# Patient Record
Sex: Female | Born: 1970
Health system: Southern US, Community
[De-identification: ages and names within clinical notes are randomized; demographics above are authoritative.]

## PROBLEM LIST (undated history)

## (undated) DIAGNOSIS — I10 Essential (primary) hypertension: Secondary | ICD-10-CM

## (undated) DIAGNOSIS — Z923 Personal history of irradiation: Secondary | ICD-10-CM

## (undated) DIAGNOSIS — K219 Gastro-esophageal reflux disease without esophagitis: Secondary | ICD-10-CM

## (undated) DIAGNOSIS — Z87442 Personal history of urinary calculi: Secondary | ICD-10-CM

## (undated) DIAGNOSIS — Z803 Family history of malignant neoplasm of breast: Secondary | ICD-10-CM

## (undated) DIAGNOSIS — Z9889 Other specified postprocedural states: Secondary | ICD-10-CM

## (undated) DIAGNOSIS — R112 Nausea with vomiting, unspecified: Secondary | ICD-10-CM

## (undated) DIAGNOSIS — N649 Disorder of breast, unspecified: Secondary | ICD-10-CM

## (undated) DIAGNOSIS — A048 Other specified bacterial intestinal infections: Secondary | ICD-10-CM

## (undated) DIAGNOSIS — IMO0001 Reserved for inherently not codable concepts without codable children: Secondary | ICD-10-CM

## (undated) DIAGNOSIS — C50919 Malignant neoplasm of unspecified site of unspecified female breast: Secondary | ICD-10-CM

## (undated) DIAGNOSIS — Z800 Family history of malignant neoplasm of digestive organs: Secondary | ICD-10-CM

## (undated) DIAGNOSIS — E7800 Pure hypercholesterolemia, unspecified: Secondary | ICD-10-CM

## (undated) DIAGNOSIS — N83209 Unspecified ovarian cyst, unspecified side: Secondary | ICD-10-CM

## (undated) DIAGNOSIS — G43909 Migraine, unspecified, not intractable, without status migrainosus: Secondary | ICD-10-CM

## (undated) HISTORY — DX: Malignant neoplasm of unspecified site of unspecified female breast: C50.919

## (undated) HISTORY — DX: Essential (primary) hypertension: I10

## (undated) HISTORY — DX: Family history of malignant neoplasm of breast: Z80.3

## (undated) HISTORY — DX: Migraine, unspecified, not intractable, without status migrainosus: G43.909

## (undated) HISTORY — DX: Family history of malignant neoplasm of digestive organs: Z80.0

## (undated) HISTORY — DX: Disorder of breast, unspecified: N64.9

## (undated) HISTORY — PX: TUBAL LIGATION: SHX77

## (undated) HISTORY — DX: Other specified bacterial intestinal infections: A04.8

---

## 2000-04-21 ENCOUNTER — Encounter: Payer: Self-pay | Admitting: Endocrinology

## 2000-04-21 ENCOUNTER — Ambulatory Visit (HOSPITAL_COMMUNITY): Admission: RE | Admit: 2000-04-21 | Discharge: 2000-04-21 | Payer: Self-pay | Admitting: Endocrinology

## 2000-07-16 ENCOUNTER — Ambulatory Visit (HOSPITAL_COMMUNITY): Admission: RE | Admit: 2000-07-16 | Discharge: 2000-07-16 | Payer: Self-pay | Admitting: Interventional Cardiology

## 2000-07-17 ENCOUNTER — Encounter: Payer: Self-pay | Admitting: General Surgery

## 2000-07-17 ENCOUNTER — Ambulatory Visit (HOSPITAL_COMMUNITY): Admission: RE | Admit: 2000-07-17 | Discharge: 2000-07-17 | Payer: Self-pay | Admitting: General Surgery

## 2000-08-22 ENCOUNTER — Other Ambulatory Visit: Admission: RE | Admit: 2000-08-22 | Discharge: 2000-08-22 | Payer: Self-pay | Admitting: Obstetrics and Gynecology

## 2000-09-22 ENCOUNTER — Ambulatory Visit (HOSPITAL_COMMUNITY): Admission: RE | Admit: 2000-09-22 | Discharge: 2000-09-22 | Payer: Self-pay | Admitting: Internal Medicine

## 2001-02-03 ENCOUNTER — Encounter: Payer: Self-pay | Admitting: Obstetrics and Gynecology

## 2001-02-03 ENCOUNTER — Ambulatory Visit (HOSPITAL_COMMUNITY): Admission: RE | Admit: 2001-02-03 | Discharge: 2001-02-03 | Payer: Self-pay | Admitting: Obstetrics and Gynecology

## 2001-08-10 ENCOUNTER — Emergency Department (HOSPITAL_COMMUNITY): Admission: EM | Admit: 2001-08-10 | Discharge: 2001-08-10 | Payer: Self-pay | Admitting: Emergency Medicine

## 2002-12-20 ENCOUNTER — Emergency Department (HOSPITAL_COMMUNITY): Admission: EM | Admit: 2002-12-20 | Discharge: 2002-12-20 | Payer: Self-pay | Admitting: Emergency Medicine

## 2002-12-20 ENCOUNTER — Encounter: Payer: Self-pay | Admitting: Emergency Medicine

## 2002-12-31 ENCOUNTER — Encounter: Payer: Self-pay | Admitting: Emergency Medicine

## 2002-12-31 ENCOUNTER — Emergency Department (HOSPITAL_COMMUNITY): Admission: EM | Admit: 2002-12-31 | Discharge: 2002-12-31 | Payer: Self-pay | Admitting: Emergency Medicine

## 2003-01-05 ENCOUNTER — Ambulatory Visit (HOSPITAL_COMMUNITY): Admission: RE | Admit: 2003-01-05 | Discharge: 2003-01-05 | Payer: Self-pay | Admitting: Urology

## 2003-04-11 ENCOUNTER — Emergency Department (HOSPITAL_COMMUNITY): Admission: EM | Admit: 2003-04-11 | Discharge: 2003-04-11 | Payer: Self-pay | Admitting: Emergency Medicine

## 2004-03-23 ENCOUNTER — Emergency Department (HOSPITAL_COMMUNITY): Admission: EM | Admit: 2004-03-23 | Discharge: 2004-03-23 | Payer: Self-pay | Admitting: Emergency Medicine

## 2004-06-27 ENCOUNTER — Emergency Department (HOSPITAL_COMMUNITY): Admission: EM | Admit: 2004-06-27 | Discharge: 2004-06-27 | Payer: Self-pay | Admitting: *Deleted

## 2004-11-29 ENCOUNTER — Emergency Department (HOSPITAL_COMMUNITY): Admission: EM | Admit: 2004-11-29 | Discharge: 2004-11-29 | Payer: Self-pay | Admitting: Emergency Medicine

## 2004-12-04 ENCOUNTER — Ambulatory Visit (HOSPITAL_COMMUNITY): Admission: RE | Admit: 2004-12-04 | Discharge: 2004-12-04 | Payer: Self-pay | Admitting: Urology

## 2004-12-05 ENCOUNTER — Ambulatory Visit (HOSPITAL_COMMUNITY): Admission: RE | Admit: 2004-12-05 | Discharge: 2004-12-05 | Payer: Self-pay | Admitting: Urology

## 2004-12-07 ENCOUNTER — Ambulatory Visit (HOSPITAL_COMMUNITY): Admission: RE | Admit: 2004-12-07 | Discharge: 2004-12-07 | Payer: Self-pay | Admitting: Urology

## 2007-11-23 ENCOUNTER — Other Ambulatory Visit: Admission: RE | Admit: 2007-11-23 | Discharge: 2007-11-23 | Payer: Self-pay | Admitting: Internal Medicine

## 2007-12-09 ENCOUNTER — Ambulatory Visit (HOSPITAL_COMMUNITY): Admission: RE | Admit: 2007-12-09 | Discharge: 2007-12-09 | Payer: Self-pay | Admitting: Internal Medicine

## 2007-12-20 ENCOUNTER — Emergency Department (HOSPITAL_COMMUNITY): Admission: EM | Admit: 2007-12-20 | Discharge: 2007-12-20 | Payer: Self-pay | Admitting: Emergency Medicine

## 2008-08-28 ENCOUNTER — Emergency Department (HOSPITAL_COMMUNITY): Admission: EM | Admit: 2008-08-28 | Discharge: 2008-08-28 | Payer: Self-pay | Admitting: Emergency Medicine

## 2009-02-03 ENCOUNTER — Emergency Department (HOSPITAL_COMMUNITY): Admission: EM | Admit: 2009-02-03 | Discharge: 2009-02-03 | Payer: Self-pay | Admitting: Emergency Medicine

## 2009-03-16 ENCOUNTER — Ambulatory Visit (HOSPITAL_COMMUNITY): Admission: RE | Admit: 2009-03-16 | Discharge: 2009-03-16 | Payer: Self-pay | Admitting: Internal Medicine

## 2009-03-28 ENCOUNTER — Ambulatory Visit (HOSPITAL_COMMUNITY): Admission: RE | Admit: 2009-03-28 | Discharge: 2009-03-28 | Payer: Self-pay | Admitting: Internal Medicine

## 2009-12-23 ENCOUNTER — Emergency Department (HOSPITAL_COMMUNITY): Admission: EM | Admit: 2009-12-23 | Discharge: 2009-12-23 | Payer: Self-pay | Admitting: Emergency Medicine

## 2010-05-23 LAB — RAPID STREP SCREEN (MED CTR MEBANE ONLY): Streptococcus, Group A Screen (Direct): NEGATIVE

## 2010-06-18 LAB — RAPID STREP SCREEN (MED CTR MEBANE ONLY): Streptococcus, Group A Screen (Direct): POSITIVE — AB

## 2010-07-27 NOTE — H&P (Signed)
Lorraine Peters, Lorraine Peters                  ACCOUNT NO.:  [id number]   MEDICAL RECORD NO.:  [id number]          PATIENT TYPE:  AMB   LOCATION:  DAY                           FACILITY:  [facility]   PHYSICIAN:  Dennie Maizes, M.D.   DATE OF BIRTH:  Nov 28, 1970   DATE OF ADMISSION:  12/04/2004  DATE OF DISCHARGE:  LH                                HISTORY & PHYSICAL   CHIEF COMPLAINT:  Intermittent left flank pain, mild hematuria.   HISTORY OF PRESENT ILLNESS:  This 40 year old female has a past history of  recurrent urolithiasis.  She has undergone urethroscopy stone extraction in  October 2004.  She has been having intermittent, severe left flank pain  radiating to the front for the past five days.  This is associated with  mildly intermittent hematuria.  She denied having any fever, chills, voiding  difficulty or dysuria. A CT scan of the abdomen and pelvis revealed a 3 mm  size lower pole renal calculus on the left side.  Also a 6 mm size stone in  the left ureteropelvic junction  area with obstruction and hydronephrosis.  The patient is unable to pass the stone.  She is brought to the short-stay  center today for extracorporeal shock wave lithotripsy of the left renal  calculus.   PAST MEDICAL HISTORY:  1.  History of intermittent low back pain.  2.  PMS.  3.  Gastroesophageal reflux disease.  4.  Recurrent urolithiasis, status post urethroscopy, stone extraction in      October 2004.   MEDICATIONS:  Tylox p.r.n. for pain.   ALLERGIES:  None.   PAST SURGICAL HISTORY:  Tubal ligation.   FAMILY HISTORY:  Positive for kidney stones and cancer.   PHYSICAL EXAMINATION:  VITAL SIGNS:  Height 5 feet 8 inches, weight 170  pounds.  HEENT:  Normal.  NECK:  No masses.  LUNGS:  Clear to auscultation.  HEART:  Regular rate and rhythm. No murmurs.  ABDOMEN:  No palpable flank masses.  Mild costovertebral angle tenderness  was noted.  Bladder is not palpable.  No suprapubic tenderness.   IMPRESSION:  Left renal colic, left renal calculus.   PLAN:  Extracorporeal shock wave lithotripsy of left renal calculus with IV  sedation in the day hospital.  I have discussed with the patient regarding  the diagnosis, operative details, alternate treatments, outcome, possible  risks and complications.  She has agreed for the procedure to be done.      Dennie Maizes, M.D.  Electronically Signed     SK/MEDQ  D:  12/05/2004  T:  12/05/2004  Job:  [id number]

## 2010-07-27 NOTE — Op Note (Signed)
Lorraine Peters, Lorraine Peters                            ACCOUNT NO.:  [id number]   MEDICAL RECORD NO.:  [id number]                   PATIENT TYPE:  AMB   LOCATION:  DAY                                  FACILITY:  [facility]   PHYSICIAN:  Dennie Maizes, M.D.                DATE OF BIRTH:  February 04, 1971   DATE OF PROCEDURE:  01/05/2003  DATE OF DISCHARGE:                                 OPERATIVE REPORT   PREOPERATIVE DIAGNOSES:  1. Right distal ureteral calculus without obstruction.  2. Right renal colic.   POSTOPERATIVE DIAGNOSES:  1. Right distal ureteral calculus without obstruction.  2. Right renal colic.   PROCEDURES:  1. Cystoscopy.  2. Retrograde pyelogram.  3. Right urethroscopy.  4. Stone manipulation.  5. Right ureteral stent placement.   ANESTHESIA:  General.   SURGEON:  Hollice Espy, M.D.   COMPLICATIONS:  None.   DRAINS:  6-French, 36 cm size right ureteral stent with a string.   INDICATIONS FOR THE PROCEDURE:  This 40 year old female was evaluated for  severe right flank pain.  She had a 4 x 3 mm size right distal ureteral  calculus without obstruction.  She also had a nonobstructing 2 mm size right  renal calculus.  The patient was unable to pass the stone.  Repeat CT scan  confirmed the persistent stone.  The patient was brought to the hospital  today for cystoscopy, right retrograde pyelogram, and urethroscopy, stone  extraction, with stent placement.   DESCRIPTION OF THE PROCEDURE:  General anesthesia was induced.  The patient  was placed on the OR table in the dorsal lithotomy position.  The lower  abdomen and genitalia were prepped and draped in a sterile fashion.  Cystoscopy was done with a 24-French scope.  There was edema, erythema, and  prominence of the right ureteral orifice with history of stone in the  intramural ureter.   A 5-French rigid catheter was then placed in the right ureteral orifice, and  retrograde pyelogram was done by using about 7 mL  of Renografin 60.  This  revealed an irregular filling defect in the distal ureter, about 2 cm above  the ureteral orifice.  The collecting system was not dilated.   A 5-French open-ended catheter was then placed in the right ureteral  orifice.  A 0.038 inch Benson guidewire with a flexible tip was then  advanced into the right renal pelvis.  The open-ended catheter was then  removed.   Urethroscopy was done with the 7.5 rigid ureteroscope without any  difficulty.  There was some erythema and edema in the intramural ureter on  the right side.  No stone was seen.  It was possible that the stone might  have migrated to the upper collecting system during the guidewire placement.  The ureteroscope was then advanced up to the level of the proximal ureter,  and no stone was  identified.  I feel the stone might have migrated to the  renal pelvis.  The ureteroscope was then removed.   The 6-French 26 cm size stent with a string was then inserted in the right  collecting system.  The patient was transferred to the PACU in satisfactory  condition.  I feel that the patient will be able to pass this small stone  after removal of the stent in about 48 hours.      ___________________________________________                                            Dennie Maizes, M.D.   SK/MEDQ  D:  01/05/2003  T:  01/05/2003  Job:  [id number]

## 2010-07-27 NOTE — H&P (Signed)
Lorraine Peters, Lorraine Peters                            ACCOUNT NO.:  [id number]   MEDICAL RECORD NO.:  [id number]                   PATIENT TYPE:  AMB   LOCATION:  DAY                                  FACILITY:  [facility]   PHYSICIAN:  Dennie Maizes, M.D.                DATE OF BIRTH:  [birth year]   DATE OF ADMISSION:  01/05/2003  DATE OF DISCHARGE:                                HISTORY & PHYSICAL   CHIEF COMPLAINT:  Right lower quadrant abdominal pain, right flank pain.   HISTORY OF PRESENT ILLNESS:  This 40 year old female was referred to me from  the emergency room at [facility].  She was seen in the office about  two weeks ago.  She experienced sudden onset of cramping pain in the right  lower quadrant of the abdomen on 12/19/2002.  This was associated with  severe nausea and vomiting.  Pain intensity was 10/10.  She did not have any  fever, chills, or gross hematuria. There was no past history of  urolithiasis.   The patient was evaluated with a non-contrast CT of the abdomen and pelvis.  This revealed moderate right-sided hydronephrosis with ureteral dilation.  Perinephric stranding was noted on the right side.  A 2 mm size stone was  noted in the left kidney without obstruction.  There was a cyst in the left  kidney measuring 2 cm in size. There was a 4 x 3 x 3 mm size distal ureteral  calculus about 2 cm proximal to the ureterosacral junction.  The patient has  not passed the stone.  She had severe recurrent pain on December 31, 2002.  She went back to the emergency room.  A CT scan was repeated. This revealed  a persistent stone in the right distal ureter.  The patient is brought to  the day hospital today for cystoscopy, right retrograde pyelogram,  ureteroscopy stone extraction, and possible ureteral stent placement.   PAST MEDICAL HISTORY:  1. History of intermittent low back pain.  2. PMS.  3. Gastroesophageal reflux disease.   MEDICATIONS:  Zoloft and Prevacid.   ALLERGIES:  None.   OPERATIONS:  Tubal ligation.   FAMILY HISTORY:  Positive for kidney stones and cancer.   PHYSICAL EXAMINATION:  VITAL SIGNS:  Height 5 feet 8 inches, weight 170  pounds.  HEENT:  Normal.  NECK:  No masses.  LUNGS:  Clear to auscultation.  HEART:  Regular rate and rhythm with no murmur.  ABDOMEN:  Soft, no palpable flank mass or CVA tenderness.  Bladder not  palpable.   IMPRESSION:  Right distal ureteral calculus with obstruction, right renal  colic.   PLAN:  Cystoscopy, right retrograde pyelogram, ureteroscopy stone extraction  and stent placement on October 27 in day hospital.  I had discussion with  the patient regarding diagnosis, operative details, alternative treatments,  outcome, possible risks and complications, and she  has agreed for the  procedure to be done.     ___________________________________________                                         Dennie Maizes, M.D.   SK/MEDQ  D:  01/04/2003  T:  01/04/2003  Job:  [id number]

## 2010-10-08 ENCOUNTER — Other Ambulatory Visit: Payer: Self-pay | Admitting: Obstetrics and Gynecology

## 2010-10-08 DIAGNOSIS — Z139 Encounter for screening, unspecified: Secondary | ICD-10-CM

## 2010-10-11 ENCOUNTER — Ambulatory Visit (HOSPITAL_COMMUNITY)
Admission: RE | Admit: 2010-10-11 | Discharge: 2010-10-11 | Disposition: A | Discharge status: Home / Self Care | Payer: Self-pay | Source: Ambulatory Visit | Attending: Obstetrics and Gynecology | Admitting: Obstetrics and Gynecology

## 2010-10-11 DIAGNOSIS — Z139 Encounter for screening, unspecified: Secondary | ICD-10-CM

## 2010-10-15 ENCOUNTER — Other Ambulatory Visit: Payer: Self-pay | Admitting: Obstetrics and Gynecology

## 2010-10-15 DIAGNOSIS — R928 Other abnormal and inconclusive findings on diagnostic imaging of breast: Secondary | ICD-10-CM

## 2010-10-24 ENCOUNTER — Other Ambulatory Visit (HOSPITAL_COMMUNITY): Payer: Self-pay | Admitting: Obstetrics and Gynecology

## 2010-10-24 ENCOUNTER — Other Ambulatory Visit: Payer: Self-pay | Admitting: Obstetrics and Gynecology

## 2010-10-24 ENCOUNTER — Ambulatory Visit (HOSPITAL_COMMUNITY)
Admission: RE | Admit: 2010-10-24 | Discharge: 2010-10-24 | Disposition: A | Discharge status: Home / Self Care | Payer: Self-pay | Source: Ambulatory Visit | Attending: Obstetrics and Gynecology | Admitting: Obstetrics and Gynecology

## 2010-10-24 DIAGNOSIS — R928 Other abnormal and inconclusive findings on diagnostic imaging of breast: Secondary | ICD-10-CM

## 2010-10-24 DIAGNOSIS — N6009 Solitary cyst of unspecified breast: Secondary | ICD-10-CM | POA: Insufficient documentation

## 2011-04-29 ENCOUNTER — Encounter (HOSPITAL_COMMUNITY): Payer: Self-pay | Admitting: Emergency Medicine

## 2011-04-29 ENCOUNTER — Emergency Department (HOSPITAL_COMMUNITY)
Admission: EM | Admit: 2011-04-29 | Discharge: 2011-04-29 | Disposition: A | Discharge status: Home / Self Care | Payer: Self-pay | Attending: Emergency Medicine | Admitting: Emergency Medicine

## 2011-04-29 ENCOUNTER — Emergency Department (HOSPITAL_COMMUNITY): Payer: Self-pay

## 2011-04-29 DIAGNOSIS — R05 Cough: Secondary | ICD-10-CM | POA: Insufficient documentation

## 2011-04-29 DIAGNOSIS — J069 Acute upper respiratory infection, unspecified: Secondary | ICD-10-CM

## 2011-04-29 DIAGNOSIS — R062 Wheezing: Secondary | ICD-10-CM | POA: Insufficient documentation

## 2011-04-29 DIAGNOSIS — J3489 Other specified disorders of nose and nasal sinuses: Secondary | ICD-10-CM | POA: Insufficient documentation

## 2011-04-29 DIAGNOSIS — R059 Cough, unspecified: Secondary | ICD-10-CM | POA: Insufficient documentation

## 2011-04-29 DIAGNOSIS — IMO0001 Reserved for inherently not codable concepts without codable children: Secondary | ICD-10-CM | POA: Insufficient documentation

## 2011-04-29 DIAGNOSIS — R0789 Other chest pain: Secondary | ICD-10-CM | POA: Insufficient documentation

## 2011-04-29 DIAGNOSIS — F17200 Nicotine dependence, unspecified, uncomplicated: Secondary | ICD-10-CM | POA: Insufficient documentation

## 2011-04-29 DIAGNOSIS — R070 Pain in throat: Secondary | ICD-10-CM | POA: Insufficient documentation

## 2011-04-29 DIAGNOSIS — R0602 Shortness of breath: Secondary | ICD-10-CM | POA: Insufficient documentation

## 2011-04-29 MED ORDER — GUAIFENESIN-CODEINE 100-10 MG/5ML PO SYRP: 10.0000 mL | ORAL_SOLUTION | Freq: Three times a day (TID) | ORAL | Status: AC | PRN

## 2011-04-29 MED ORDER — AZITHROMYCIN 250 MG PO TABS: ORAL_TABLET | ORAL | Status: DC

## 2011-04-29 MED ORDER — IPRATROPIUM BROMIDE 0.02 % IN SOLN
0.5000 mg | Freq: Once | RESPIRATORY_TRACT | Status: AC
  Administered 2011-04-29: 0.5 mg via RESPIRATORY_TRACT
  Filled 2011-04-29: qty 2.5

## 2011-04-29 MED ORDER — AZITHROMYCIN 250 MG PO TABS
500.0000 mg | ORAL_TABLET | Freq: Once | ORAL | Status: AC
  Administered 2011-04-29: 500 mg via ORAL
  Filled 2011-04-29: qty 2

## 2011-04-29 MED ORDER — ALBUTEROL SULFATE (5 MG/ML) 0.5% IN NEBU
5.0000 mg | INHALATION_SOLUTION | Freq: Once | RESPIRATORY_TRACT | Status: AC
  Administered 2011-04-29: 5 mg via RESPIRATORY_TRACT
  Filled 2011-04-29: qty 1

## 2011-04-29 MED ORDER — ALBUTEROL SULFATE HFA 108 (90 BASE) MCG/ACT IN AERS
2.0000 | INHALATION_SPRAY | Freq: Once | RESPIRATORY_TRACT | Status: AC
  Administered 2011-04-29: 2 via RESPIRATORY_TRACT
  Filled 2011-04-29: qty 6.7

## 2011-04-29 NOTE — ED Notes (Signed)
Pt c/o cough, congestion, sob, sore throat and chest pain when she takes a deep breath or coughs.

## 2011-04-29 NOTE — ED Provider Notes (Signed)
History     CSN: [id number]  Arrival date & time 04/29/11  1116   First MD Initiated Contact with Patient 04/29/11 1223      Chief Complaint  Patient presents with  . Cough  . Sore Throat  . Chest Pain  . Shortness of Breath    (Consider location/radiation/quality/duration/timing/severity/associated sxs/prior treatment) HPI Comments: Patient c/o cough, nasal congestion, body aches and sore throat for several days.  States the cough is mostly non-productive but has occasional sputum production.  She states she has frequent episodes of excessive coughing and she states she gets short of breath with the excessive coughing.  She denies fever but reports having chills.  She denies vomiting, headaches, or dysuria.  Patient is a 41 y.o. female presenting with cough. The history is provided by the patient. No language interpreter was used.  Cough This is a new problem. The problem occurs every few minutes. The cough is productive of sputum. There has been no fever. Associated symptoms include chills, rhinorrhea, sore throat, myalgias, shortness of breath and wheezing. Pertinent negatives include no chest pain and no headaches. She has tried cough syrup for the symptoms. The treatment provided no relief. She is a smoker. Her past medical history does not include bronchitis, pneumonia or asthma.    History reviewed. No pertinent past medical history.  Past Surgical History  Procedure Date  . Tubal ligation     History reviewed. No pertinent family history.  History  Substance Use Topics  . Smoking status: Current Everyday Smoker    Types: Cigarettes  . Smokeless tobacco: Not on file  . Alcohol Use: No    OB History    Grav Para Term Preterm Abortions TAB SAB Ect Mult Living                  Review of Systems  Constitutional: Positive for chills. Negative for fever and activity change.  HENT: Positive for congestion, sore throat and rhinorrhea. Negative for trouble swallowing,  neck pain and neck stiffness.   Respiratory: Positive for cough, chest tightness, shortness of breath and wheezing.   Cardiovascular: Negative for chest pain and palpitations.  Gastrointestinal: Negative for nausea, vomiting and abdominal pain.  Genitourinary: Negative for dysuria.  Musculoskeletal: Positive for myalgias.  Skin: Negative.   Neurological: Negative for dizziness, weakness, numbness and headaches.  Hematological: Negative for adenopathy.  All other systems reviewed and are negative.    Allergies  Bee venom  Home Medications   Current Outpatient Rx  Name Route Sig Dispense Refill  . CELECOXIB 200 MG PO CAPS Oral Take 200 mg by mouth 2 (two) times daily.    Marland Kitchen DM-PHENYLEPHRINE-ACETAMINOPHEN 10-5-325 MG PO CAPS Oral Take 1 tablet by mouth every 6 (six) hours as needed.    Marland Kitchen ESOMEPRAZOLE MAGNESIUM 40 MG PO CPDR Oral Take 40 mg by mouth daily before breakfast.    . OMEGA-3 FATTY ACIDS 1000 MG PO CAPS Oral Take 1 g by mouth 2 (two) times daily.    Marland Kitchen PROPRANOLOL HCL 80 MG PO TABS Oral Take 80 mg by mouth 2 (two) times daily.    Marland Kitchen SIMVASTATIN 10 MG PO TABS Oral Take 10 mg by mouth at bedtime.      BP 135/76  Pulse 67  Temp(Src) 97.6 F (36.4 C) (Oral)  Resp 17  Ht 5\' 8"  (1.727 m)  Wt 215 lb (97.523 kg)  BMI 32.69 kg/m2  SpO2 95%  LMP 04/27/2011  Physical Exam  Nursing note and  vitals reviewed. Constitutional: She is oriented to person, place, and time. She appears well-developed and well-nourished. No distress.  HENT:  Head: Normocephalic and atraumatic.  Right Ear: Tympanic membrane and ear canal normal.  Left Ear: Tympanic membrane and ear canal normal.  Nose: Mucosal edema present.  Mouth/Throat: Uvula is midline, oropharynx is clear and moist and mucous membranes are normal.  Neck: Normal range of motion. Neck supple.  Cardiovascular: Normal rate, regular rhythm, normal heart sounds and intact distal pulses.   No murmur heard. Pulmonary/Chest: Effort  normal. No stridor. No respiratory distress. She has wheezes. She has no rales. She exhibits tenderness.  Musculoskeletal: Normal range of motion. She exhibits no edema and no tenderness.  Lymphadenopathy:    She has no cervical adenopathy.  Neurological: She is alert and oriented to person, place, and time. She exhibits normal muscle tone. Coordination normal.  Skin: Skin is warm and dry.    ED Course  Procedures (including critical care time)  Labs Reviewed - No data to display Dg Chest 2 View  04/29/2011  *RADIOLOGY REPORT*  Clinical Data: Chest pain, cough and congestion.  CHEST - 2 VIEW  Comparison: 12/23/2009.  Findings: Trachea is midline.  Heart size normal.  Lungs are clear. No pleural fluid.  IMPRESSION: No acute findings.  Original Report Authenticated By: Reyes Ivan, M.D.       MDM    Patient is alert, no acute distress. She is nontoxic appearing. No tachycardia tachypnea or hypoxia to suggest PE.  She has  nasal congestion, sore throat and active coughing during her ED stay.  Chest x-ray is clear.  Symptoms are likely related to a URI versus bronchitis.  Will treat with albuterol inhaler, antibiotics, and patient requests cough medication. She agrees to return to the emergency department in 1-2 days if her symptoms worsen  Patient / Family / Caregiver understand and agree with initial ED impression and plan with expectations set for ED visit. Pt feels improved after observation and/or treatment in ED. Pt stable in ED with no significant deterioration in condition.     Damico Partin L. [place], [place] 05/01/11 2236

## 2011-05-01 NOTE — ED Provider Notes (Signed)
Medical screening examination/treatment/procedure(s) were performed by non-physician practitioner and as supervising physician I was immediately available for consultation/collaboration.  Nickolis Diel P Mili Piltz, MD 05/01/11 0712 

## 2011-05-02 NOTE — ED Provider Notes (Signed)
Medical screening examination/treatment/procedure(s) were performed by non-physician practitioner and as supervising physician I was immediately available for consultation/collaboration.  Rafferty Postlewait P Stephens Shreve, MD 05/02/11 0711 

## 2011-10-09 ENCOUNTER — Other Ambulatory Visit (HOSPITAL_COMMUNITY): Payer: Self-pay | Admitting: Physician Assistant

## 2011-10-09 DIAGNOSIS — Z139 Encounter for screening, unspecified: Secondary | ICD-10-CM

## 2011-10-25 ENCOUNTER — Ambulatory Visit (HOSPITAL_COMMUNITY)
Admission: RE | Admit: 2011-10-25 | Discharge: 2011-10-25 | Disposition: A | Discharge status: Home / Self Care | Payer: Self-pay | Source: Ambulatory Visit | Attending: Physician Assistant | Admitting: Physician Assistant

## 2011-10-25 ENCOUNTER — Ambulatory Visit (HOSPITAL_COMMUNITY): Payer: Self-pay

## 2011-10-25 DIAGNOSIS — Z139 Encounter for screening, unspecified: Secondary | ICD-10-CM

## 2012-06-18 ENCOUNTER — Emergency Department (HOSPITAL_COMMUNITY)
Admission: EM | Admit: 2012-06-18 | Discharge: 2012-06-18 | Disposition: A | Discharge status: Home / Self Care | Payer: Self-pay | Attending: Emergency Medicine | Admitting: Emergency Medicine

## 2012-06-18 ENCOUNTER — Encounter (HOSPITAL_COMMUNITY): Payer: Self-pay

## 2012-06-18 ENCOUNTER — Emergency Department (HOSPITAL_COMMUNITY): Payer: Self-pay

## 2012-06-18 DIAGNOSIS — Z79899 Other long term (current) drug therapy: Secondary | ICD-10-CM | POA: Insufficient documentation

## 2012-06-18 DIAGNOSIS — J40 Bronchitis, not specified as acute or chronic: Secondary | ICD-10-CM | POA: Insufficient documentation

## 2012-06-18 DIAGNOSIS — J069 Acute upper respiratory infection, unspecified: Secondary | ICD-10-CM | POA: Insufficient documentation

## 2012-06-18 DIAGNOSIS — Z791 Long term (current) use of non-steroidal anti-inflammatories (NSAID): Secondary | ICD-10-CM | POA: Insufficient documentation

## 2012-06-18 DIAGNOSIS — F17200 Nicotine dependence, unspecified, uncomplicated: Secondary | ICD-10-CM | POA: Insufficient documentation

## 2012-06-18 DIAGNOSIS — J3489 Other specified disorders of nose and nasal sinuses: Secondary | ICD-10-CM | POA: Insufficient documentation

## 2012-06-18 DIAGNOSIS — H6690 Otitis media, unspecified, unspecified ear: Secondary | ICD-10-CM | POA: Insufficient documentation

## 2012-06-18 DIAGNOSIS — H9190 Unspecified hearing loss, unspecified ear: Secondary | ICD-10-CM | POA: Insufficient documentation

## 2012-06-18 DIAGNOSIS — H6691 Otitis media, unspecified, right ear: Secondary | ICD-10-CM

## 2012-06-18 DIAGNOSIS — R0989 Other specified symptoms and signs involving the circulatory and respiratory systems: Secondary | ICD-10-CM | POA: Insufficient documentation

## 2012-06-18 DIAGNOSIS — H9209 Otalgia, unspecified ear: Secondary | ICD-10-CM | POA: Insufficient documentation

## 2012-06-18 MED ORDER — LIDOCAINE HCL (PF) 1 % IJ SOLN
INTRAMUSCULAR | Status: AC
  Administered 2012-06-18: 5 mL
  Filled 2012-06-18: qty 5

## 2012-06-18 MED ORDER — CEFTRIAXONE SODIUM 1 G IJ SOLR
1.0000 g | Freq: Once | INTRAMUSCULAR | Status: AC
  Administered 2012-06-18: 1 g via INTRAMUSCULAR
  Filled 2012-06-18: qty 10

## 2012-06-18 MED ORDER — ALBUTEROL SULFATE HFA 108 (90 BASE) MCG/ACT IN AERS
2.0000 | INHALATION_SPRAY | Freq: Once | RESPIRATORY_TRACT | Status: AC
  Administered 2012-06-18: 2 via RESPIRATORY_TRACT
  Filled 2012-06-18: qty 6.7

## 2012-06-18 MED ORDER — PREDNISONE 10 MG PO TABS: ORAL_TABLET | ORAL | Status: DC

## 2012-06-18 MED ORDER — GUAIFENESIN-CODEINE 100-10 MG/5ML PO SYRP: 10.0000 mL | ORAL_SOLUTION | Freq: Three times a day (TID) | ORAL | Status: DC | PRN

## 2012-06-18 NOTE — ED Notes (Signed)
Pt reports being sich for 2 weeks w/ cough and congestion. Has been on antibiotic since 4/2 and not getting any better. Denies fever.

## 2012-06-18 NOTE — ED Provider Notes (Signed)
History     CSN: [id number]  Arrival date & time 06/18/12  0944   First MD Initiated Contact with Patient 06/18/12 1004      Chief Complaint  Patient presents with  . Cough  . Nasal Congestion    (Consider location/radiation/quality/duration/timing/severity/associated sxs/prior treatment) HPI Comments: Patient c/o non-productive cough and chest congestion for 2 weeks.  States the congestion "seemed to move" from her chest to her head.  C/o bilateral ear pain and fullness and occasionally feels a "popping sensation to her ears".  States she  Was seen by her PMD and given Amoxil which she has been taking for 7 days w/o relief.  States the cough is worse with activity and lying down.  She denies chills, fever, hemoptysis , chest pain or shortness of breath  Patient is a 42 y.o. female presenting with cough. The history is provided by the patient.  Cough Cough characteristics:  Non-productive and dry Severity:  Moderate Onset quality:  Gradual Duration:  2 weeks Timing:  Intermittent Progression:  Unchanged Chronicity:  New Smoker: yes   Context: upper respiratory infection and with activity   Relieved by:  Nothing Worsened by:  Lying down and activity Associated symptoms: ear fullness, ear pain, rhinorrhea and sinus congestion   Associated symptoms: no chest pain, no chills, no diaphoresis, no eye discharge, no fever, no headaches, no myalgias, no rash, no shortness of breath, no sore throat and no wheezing   Ear pain:    Location:  Bilateral   Severity:  Moderate   Onset quality:  Gradual   Duration:  1 week   Timing:  Constant   Progression:  Unchanged   Chronicity:  New   History reviewed. No pertinent past medical history.  Past Surgical History  Procedure Laterality Date  . Tubal ligation      No family history on file.  History  Substance Use Topics  . Smoking status: Current Every Day Smoker    Types: Cigarettes  . Smokeless tobacco: Not on file  . Alcohol  Use: No    OB History   Grav Para Term Preterm Abortions TAB SAB Ect Mult Living                  Review of Systems  Constitutional: Negative for fever, chills, diaphoresis, activity change and appetite change.  HENT: Positive for hearing loss, ear pain and rhinorrhea. Negative for sore throat, facial swelling, neck pain, neck stiffness and ear discharge.   Eyes: Negative for discharge and visual disturbance.  Respiratory: Positive for cough. Negative for chest tightness, shortness of breath and wheezing.   Cardiovascular: Negative for chest pain.  Gastrointestinal: Negative for nausea, vomiting and abdominal pain.  Musculoskeletal: Negative for myalgias.  Skin: Negative for rash.  Neurological: Negative for dizziness and headaches.  All other systems reviewed and are negative.    Allergies  Bee venom  Home Medications   Current Outpatient Rx  Name  Route  Sig  Dispense  Refill  . azithromycin (ZITHROMAX) 250 MG tablet      Take one tablet po qd x 4 days   4 tablet   0   . celecoxib (CELEBREX) 200 MG capsule   Oral   Take 200 mg by mouth 2 (two) times daily.         Marland Kitchen DM-Phenylephrine-Acetaminophen (ALKA-SELTZER PLS SINUS & COUGH) 10-5-325 MG CAPS   Oral   Take 1 tablet by mouth every 6 (six) hours as needed.         Marland Kitchen  esomeprazole (NEXIUM) 40 MG capsule   Oral   Take 40 mg by mouth daily before breakfast.         . fish oil-omega-3 fatty acids 1000 MG capsule   Oral   Take 1 g by mouth 2 (two) times daily.         . propranolol (INDERAL) 80 MG tablet   Oral   Take 80 mg by mouth 2 (two) times daily.         . simvastatin (ZOCOR) 10 MG tablet   Oral   Take 10 mg by mouth at bedtime.           BP 132/72  Pulse 76  Temp(Src) 98.1 F (36.7 C) (Oral)  Resp 22  Ht 5\' 8"  (1.727 m)  Wt 210 lb (95.255 kg)  BMI 31.94 kg/m2  SpO2 98%  LMP 06/14/2012  Physical Exam  Nursing note and vitals reviewed. Constitutional: She is oriented to person,  place, and time. She appears well-developed and well-nourished. No distress.  HENT:  Head: Normocephalic and atraumatic.  Right Ear: Ear canal normal. No drainage. No mastoid tenderness. Tympanic membrane is erythematous. Tympanic membrane is not retracted and not bulging. A middle ear effusion is present. No hemotympanum. Decreased hearing is noted.  Left Ear: Ear canal normal. No drainage. No mastoid tenderness. Tympanic membrane is not erythematous, not retracted and not bulging. A middle ear effusion is present. No hemotympanum. No decreased hearing is noted.  Nose: Mucosal edema present.  Mouth/Throat: Uvula is midline, oropharynx is clear and moist and mucous membranes are normal. No oropharyngeal exudate.  Eyes: EOM are normal. Pupils are equal, round, and reactive to light.  Neck: Normal range of motion. Neck supple.  Cardiovascular: Normal rate, regular rhythm, normal heart sounds and intact distal pulses.   No murmur heard. Pulmonary/Chest: Effort normal. No respiratory distress. She has no rales. She exhibits no tenderness.  Coarse lungs sounds bilaterally.  No rales or wheezing  Musculoskeletal: She exhibits no edema.  Lymphadenopathy:    She has no cervical adenopathy.  Neurological: She is alert and oriented to person, place, and time. She exhibits normal muscle tone. Coordination normal.  Skin: Skin is warm and dry.    ED Course  Procedures (including critical care time)  Labs Reviewed - No data to display No results found.      MDM   Nursing notes, vitals reviewed and considered.    Cough, chest and nasal congestion with right OM.  Pt is otherwise well appearing.  VSS.  PERC negative.  Sx's not improving with Amoxil.  Will dispense albuterol MDI, Rocephin IM given , prescribe robitussin AC and prednisone taper and pt agrees to continue amoxil   She appears stable for d/c and agrees to care plan.  Will f/u with her PMD for recheck       Shafter Jupin L. Cheston Coury,  PA-C 06/18/12 1032

## 2012-06-18 NOTE — ED Notes (Signed)
Pt states she started amoxicillin seven days ago for a sinus infection. Pt states that she woke up this morning with heaviness in her chest. Pt states she was not experiencing this sx prior to this morning. Pt is SOB with exertion and has a cough as a result. Pt coughing while sitting in bed during examination.

## 2012-06-20 NOTE — ED Provider Notes (Signed)
Medical screening examination/treatment/procedure(s) were performed by non-physician practitioner and as supervising physician I was immediately available for consultation/collaboration.  Lemma Tetro, MD 06/20/12 1134 

## 2012-09-16 ENCOUNTER — Other Ambulatory Visit (HOSPITAL_COMMUNITY): Payer: Self-pay | Admitting: Physician Assistant

## 2012-09-16 DIAGNOSIS — Z139 Encounter for screening, unspecified: Secondary | ICD-10-CM

## 2012-10-26 ENCOUNTER — Ambulatory Visit (HOSPITAL_COMMUNITY): Payer: Self-pay

## 2012-10-26 ENCOUNTER — Ambulatory Visit (HOSPITAL_COMMUNITY)
Admission: RE | Admit: 2012-10-26 | Discharge: 2012-10-26 | Disposition: A | Discharge status: Home / Self Care | Payer: Self-pay | Source: Ambulatory Visit | Attending: Physician Assistant | Admitting: Physician Assistant

## 2012-10-26 DIAGNOSIS — Z139 Encounter for screening, unspecified: Secondary | ICD-10-CM

## 2013-07-26 ENCOUNTER — Emergency Department (HOSPITAL_COMMUNITY)
Admission: EM | Admit: 2013-07-26 | Discharge: 2013-07-26 | Disposition: A | Discharge status: Home / Self Care | Payer: Self-pay | Attending: Emergency Medicine | Admitting: Emergency Medicine

## 2013-07-26 ENCOUNTER — Encounter (HOSPITAL_COMMUNITY): Payer: Self-pay | Admitting: Emergency Medicine

## 2013-07-26 ENCOUNTER — Emergency Department (HOSPITAL_COMMUNITY): Payer: Self-pay

## 2013-07-26 DIAGNOSIS — IMO0001 Reserved for inherently not codable concepts without codable children: Secondary | ICD-10-CM | POA: Insufficient documentation

## 2013-07-26 DIAGNOSIS — F17200 Nicotine dependence, unspecified, uncomplicated: Secondary | ICD-10-CM | POA: Insufficient documentation

## 2013-07-26 DIAGNOSIS — Z79899 Other long term (current) drug therapy: Secondary | ICD-10-CM | POA: Insufficient documentation

## 2013-07-26 DIAGNOSIS — J159 Unspecified bacterial pneumonia: Secondary | ICD-10-CM | POA: Insufficient documentation

## 2013-07-26 DIAGNOSIS — J189 Pneumonia, unspecified organism: Secondary | ICD-10-CM

## 2013-07-26 LAB — CBC WITH DIFFERENTIAL/PLATELET
Basophils Absolute: 0 10*3/uL (ref 0.0–0.1)
Basophils Relative: 0 % (ref 0–1)
Eosinophils Absolute: 0.1 10*3/uL (ref 0.0–0.7)
Eosinophils Relative: 1 % (ref 0–5)
HCT: 42.8 % (ref 36.0–46.0)
Hemoglobin: 14.3 g/dL (ref 12.0–15.0)
LYMPHS ABS: 1.3 10*3/uL (ref 0.7–4.0)
LYMPHS PCT: 14 % (ref 12–46)
MCH: 28.8 pg (ref 26.0–34.0)
MCHC: 33.4 g/dL (ref 30.0–36.0)
MCV: 86.3 fL (ref 78.0–100.0)
Monocytes Absolute: 0.8 10*3/uL (ref 0.1–1.0)
Monocytes Relative: 9 % (ref 3–12)
NEUTROS ABS: 6.8 10*3/uL (ref 1.7–7.7)
NEUTROS PCT: 76 % (ref 43–77)
PLATELETS: 238 10*3/uL (ref 150–400)
RBC: 4.96 MIL/uL (ref 3.87–5.11)
RDW: 13.4 % (ref 11.5–15.5)
WBC: 9 10*3/uL (ref 4.0–10.5)

## 2013-07-26 LAB — BASIC METABOLIC PANEL
BUN: 8 mg/dL (ref 6–23)
CO2: 24 meq/L (ref 19–32)
Calcium: 9.3 mg/dL (ref 8.4–10.5)
Chloride: 98 mEq/L (ref 96–112)
Creatinine, Ser: 0.52 mg/dL (ref 0.50–1.10)
GFR calc Af Amer: >90 mL/min (ref >90)
GLUCOSE: 123 mg/dL — AB (ref 70–99)
POTASSIUM: 4 meq/L (ref 3.7–5.3)
SODIUM: 134 meq/L — AB (ref 137–147)

## 2013-07-26 MED ORDER — HYDROCOD POLST-CHLORPHEN POLST 10-8 MG/5ML PO LQCR: 5.0000 mL | Freq: Two times a day (BID) | ORAL | Status: DC

## 2013-07-26 MED ORDER — ACETAMINOPHEN 325 MG PO TABS
650.0000 mg | ORAL_TABLET | Freq: Once | ORAL | Status: AC
  Administered 2013-07-26: 650 mg via ORAL
  Filled 2013-07-26: qty 2

## 2013-07-26 MED ORDER — IBUPROFEN 800 MG PO TABS: 800.0000 mg | ORAL_TABLET | Freq: Three times a day (TID) | ORAL | Status: DC

## 2013-07-26 MED ORDER — LEVOFLOXACIN 500 MG PO TABS: 500.0000 mg | ORAL_TABLET | Freq: Every day | ORAL | Status: DC

## 2013-07-26 MED ORDER — LEVOFLOXACIN 750 MG PO TABS
750.0000 mg | ORAL_TABLET | Freq: Once | ORAL | Status: AC
  Administered 2013-07-26: 750 mg via ORAL
  Filled 2013-07-26: qty 1

## 2013-07-26 MED ORDER — IBUPROFEN 800 MG PO TABS
800.0000 mg | ORAL_TABLET | Freq: Once | ORAL | Status: AC
  Administered 2013-07-26: 800 mg via ORAL
  Filled 2013-07-26: qty 1

## 2013-07-26 NOTE — ED Provider Notes (Signed)
CSN: [id number]     Arrival date & time 07/26/13  1714 History   First MD Initiated Contact with Patient 07/26/13 1742     Chief Complaint  Patient presents with  . Fever     HPI  Patient presents with symptoms for 4 days. Coughing congestion body aches and fever.   Hurts to cough. In her left chest. Easily fatigued. Not really short of breath. Not a good appetite for solids but tolerating liquids well hydrated well. Temperature 102 a few days ago. Using Alka-Seltzer and occasional Tylenol at home.  History reviewed. No pertinent past medical history. Past Surgical History  Procedure Laterality Date  . Tubal ligation     History reviewed. No pertinent family history. History  Substance Use Topics  . Smoking status: Current Every Day Smoker    Types: Cigarettes  . Smokeless tobacco: Not on file  . Alcohol Use: No   OB History   Grav Para Term Preterm Abortions TAB SAB Ect Mult Living                 Review of Systems  Constitutional: Positive for fever. Negative for chills, diaphoresis, appetite change and fatigue.  HENT: Negative for mouth sores, sore throat and trouble swallowing.   Eyes: Negative for visual disturbance.  Respiratory: Positive for cough and shortness of breath. Negative for chest tightness and wheezing.   Cardiovascular: Positive for chest pain.  Gastrointestinal: Negative for nausea, vomiting, abdominal pain, diarrhea and abdominal distention.  Endocrine: Negative for polydipsia, polyphagia and polyuria.  Genitourinary: Negative for dysuria, frequency and hematuria.  Musculoskeletal: Positive for myalgias. Negative for gait problem.  Skin: Negative for color change, pallor and rash.  Neurological: Negative for dizziness, syncope, light-headedness and headaches.  Hematological: Does not bruise/bleed easily.  Psychiatric/Behavioral: Negative for behavioral problems and confusion.      Allergies  Bee venom  Home Medications   Prior to Admission  medications   Medication Sig Start Date End Date Taking? Authorizing Provider  esomeprazole (NEXIUM) 40 MG capsule Take 40 mg by mouth daily before breakfast.   Yes Historical Provider, MD  propranolol (INDERAL) 80 MG tablet Take 80 mg by mouth 2 (two) times daily.   Yes Historical Provider, MD  simvastatin (ZOCOR) 10 MG tablet Take 10 mg by mouth at bedtime.   Yes Historical Provider, MD   BP 144/95  Pulse 128  Temp(Src) 100.9 F (38.3 C) (Oral)  Resp 22  Ht 5\' 8"  (1.727 m)  Wt 210 lb (95.255 kg)  BMI 31.94 kg/m2  SpO2 93%  LMP 07/23/2013 Physical Exam  Constitutional: She is oriented to person, place, and time. She appears well-developed and well-nourished. No distress.  Appears not feel well. Not frankly toxic. No increased work of breathing.  HENT:  Head: Normocephalic.  Eyes: Conjunctivae are normal. Pupils are equal, round, and reactive to light. No scleral icterus.  Neck: Normal range of motion. Neck supple. No thyromegaly present.  Cardiovascular: Normal rate and regular rhythm.  Exam reveals no gallop and no friction rub.   No murmur heard. Pulmonary/Chest: Effort normal. No respiratory distress. She has decreased breath sounds in the left middle field. She has no wheezes. She has no rales.  Crackles at the left mid lung. Primarily laterally at the axilla.  Abdominal: Soft. Bowel sounds are normal. She exhibits no distension. There is no tenderness. There is no rebound.  Musculoskeletal: Normal range of motion.  Neurological: She is alert and oriented to person, place, and time.  Skin: Skin is warm and dry. No rash noted.  Psychiatric: She has a normal mood and affect. Her behavior is normal.    ED Course  Procedures (including critical care time) Labs Review Labs Reviewed  BASIC METABOLIC PANEL - Abnormal; Notable for the following:    Sodium 134 (*)    Glucose, Bld 123 (*)    All other components within normal limits  CBC WITH DIFFERENTIAL    Imaging Review Dg  Chest 2 View  07/26/2013   CLINICAL DATA:  Fever. Cough and congestion for several days. Ex-smoker.  EXAM: CHEST  2 VIEW  COMPARISON:  04/29/2011  FINDINGS: Patient rotated right. Midline trachea. Normal heart size and mediastinal contours. No pleural effusion or pneumothorax. Lingular and inferior left upper lobe airspace disease. Right lung clear.  IMPRESSION: Left-sided airspace disease, most consistent with pneumonia. Recommend radiographic follow-up until clearing.   Electronically Signed   By: Abigail Miyamoto M.D.   On: 07/26/2013 18:25     EKG Interpretation None      MDM   Final diagnoses:  Community acquired pneumonia    Exam and radiograph show left lower lung pneumonia. She's not hypoxemic. No increased work of breathing. Given by mouth Levaquin. Plan will be antibiotics, fluids, anti-inflammatories, cough suppressant.    Tanna Furry, MD 07/26/13 2105

## 2013-07-26 NOTE — Discharge Instructions (Signed)
Pneumonia, Adult °Pneumonia is an infection of the lungs. It may be caused by a germ (virus or bacteria). Some types of pneumonia can spread easily from person to person. This can happen when you cough or sneeze. °HOME CARE °· Only take medicine as told by your doctor. °· Take your medicine (antibiotics) as told. Finish it even if you start to feel better. °· Do not smoke. °· You may use a vaporizer or humidifier in your room. This can help loosen thick spit (mucus). °· Sleep so you are almost sitting up (semi-upright). This helps reduce coughing. °· Rest. °A shot (vaccine) can help prevent pneumonia. Shots are often advised for: °· People over 65 years old. °· Patients on chemotherapy. °· People with long-term (chronic) lung problems. °· People with immune system problems. °GET HELP RIGHT AWAY IF:  °· You are getting worse. °· You cannot control your cough, and you are losing sleep. °· You cough up blood. °· Your pain gets worse, even with medicine. °· You have a fever. °· Any of your problems are getting worse, not better. °· You have shortness of breath or chest pain. °MAKE SURE YOU:  °· Understand these instructions. °· Will watch your condition. °· Will get help right away if you are not doing well or get worse. °Document Released: 08/14/2007 Document Revised: 05/20/2011 Document Reviewed: 05/18/2010 °[employer]® Patient Information ©2014 [employer]. ° °

## 2013-07-26 NOTE — ED Notes (Signed)
MD at bedside. 

## 2013-07-26 NOTE — ED Notes (Signed)
Heavy in chest, fever, cough ,body aches.  T 102 on Sat.

## 2013-11-09 ENCOUNTER — Other Ambulatory Visit (HOSPITAL_COMMUNITY): Payer: Self-pay | Admitting: Physician Assistant

## 2013-11-09 DIAGNOSIS — Z1231 Encounter for screening mammogram for malignant neoplasm of breast: Secondary | ICD-10-CM

## 2013-11-11 ENCOUNTER — Ambulatory Visit (HOSPITAL_COMMUNITY)
Admission: RE | Admit: 2013-11-11 | Discharge: 2013-11-11 | Disposition: A | Discharge status: Home / Self Care | Payer: Self-pay | Source: Ambulatory Visit | Attending: Physician Assistant | Admitting: Physician Assistant

## 2013-11-11 ENCOUNTER — Other Ambulatory Visit (HOSPITAL_COMMUNITY): Payer: Self-pay | Admitting: Physician Assistant

## 2013-11-11 DIAGNOSIS — Z1231 Encounter for screening mammogram for malignant neoplasm of breast: Secondary | ICD-10-CM

## 2013-11-12 ENCOUNTER — Ambulatory Visit (HOSPITAL_COMMUNITY): Payer: Self-pay

## 2013-12-25 ENCOUNTER — Encounter (HOSPITAL_COMMUNITY): Payer: Self-pay | Admitting: Emergency Medicine

## 2013-12-25 ENCOUNTER — Emergency Department (HOSPITAL_COMMUNITY): Payer: Self-pay

## 2013-12-25 ENCOUNTER — Emergency Department (HOSPITAL_COMMUNITY)
Admission: EM | Admit: 2013-12-25 | Discharge: 2013-12-25 | Disposition: A | Discharge status: Home / Self Care | Payer: Self-pay | Attending: Emergency Medicine | Admitting: Emergency Medicine

## 2013-12-25 DIAGNOSIS — Z3202 Encounter for pregnancy test, result negative: Secondary | ICD-10-CM | POA: Insufficient documentation

## 2013-12-25 DIAGNOSIS — R109 Unspecified abdominal pain: Secondary | ICD-10-CM

## 2013-12-25 DIAGNOSIS — Z9851 Tubal ligation status: Secondary | ICD-10-CM | POA: Insufficient documentation

## 2013-12-25 DIAGNOSIS — M549 Dorsalgia, unspecified: Secondary | ICD-10-CM | POA: Insufficient documentation

## 2013-12-25 DIAGNOSIS — N23 Unspecified renal colic: Secondary | ICD-10-CM | POA: Insufficient documentation

## 2013-12-25 DIAGNOSIS — Z79899 Other long term (current) drug therapy: Secondary | ICD-10-CM | POA: Insufficient documentation

## 2013-12-25 DIAGNOSIS — Z720 Tobacco use: Secondary | ICD-10-CM | POA: Insufficient documentation

## 2013-12-25 DIAGNOSIS — Z87442 Personal history of urinary calculi: Secondary | ICD-10-CM | POA: Insufficient documentation

## 2013-12-25 LAB — URINE MICROSCOPIC-ADD ON

## 2013-12-25 LAB — BASIC METABOLIC PANEL
Anion gap: 10 (ref 5–15)
BUN: 15 mg/dL (ref 6–23)
CO2: 25 meq/L (ref 19–32)
Calcium: 9.6 mg/dL (ref 8.4–10.5)
Chloride: 106 mEq/L (ref 96–112)
Creatinine, Ser: 0.58 mg/dL (ref 0.50–1.10)
GFR calc Af Amer: >90 mL/min (ref >90)
GLUCOSE: 114 mg/dL — AB (ref 70–99)
POTASSIUM: 3.9 meq/L (ref 3.7–5.3)
Sodium: 141 mEq/L (ref 137–147)

## 2013-12-25 LAB — URINALYSIS, ROUTINE W REFLEX MICROSCOPIC
Bilirubin Urine: NEGATIVE
GLUCOSE, UA: NEGATIVE mg/dL
Ketones, ur: NEGATIVE mg/dL
Leukocytes, UA: NEGATIVE
Nitrite: NEGATIVE
PH: 6 (ref 5.0–8.0)
Protein, ur: NEGATIVE mg/dL
SPECIFIC GRAVITY, URINE: 1.025 (ref 1.005–1.030)
Urobilinogen, UA: 0.2 mg/dL (ref 0.0–1.0)

## 2013-12-25 LAB — CBC WITH DIFFERENTIAL/PLATELET
Basophils Absolute: 0 10*3/uL (ref 0.0–0.1)
Basophils Relative: 0 % (ref 0–1)
EOS ABS: 0.1 10*3/uL (ref 0.0–0.7)
Eosinophils Relative: 2 % (ref 0–5)
HCT: 39.9 % (ref 36.0–46.0)
HEMOGLOBIN: 13.4 g/dL (ref 12.0–15.0)
LYMPHS ABS: 3.3 10*3/uL (ref 0.7–4.0)
Lymphocytes Relative: 35 % (ref 12–46)
MCH: 29 pg (ref 26.0–34.0)
MCHC: 33.6 g/dL (ref 30.0–36.0)
MCV: 86.4 fL (ref 78.0–100.0)
MONOS PCT: 9 % (ref 3–12)
Monocytes Absolute: 0.8 10*3/uL (ref 0.1–1.0)
NEUTROS PCT: 54 % (ref 43–77)
Neutro Abs: 5.2 10*3/uL (ref 1.7–7.7)
Platelets: 272 10*3/uL (ref 150–400)
RBC: 4.62 MIL/uL (ref 3.87–5.11)
RDW: 13.8 % (ref 11.5–15.5)
WBC: 9.5 10*3/uL (ref 4.0–10.5)

## 2013-12-25 LAB — POC URINE PREG, ED: PREG TEST UR: NEGATIVE

## 2013-12-25 MED ORDER — HYDROMORPHONE HCL 1 MG/ML IJ SOLN
1.0000 mg | Freq: Once | INTRAMUSCULAR | Status: AC
  Administered 2013-12-25: 1 mg via INTRAVENOUS
  Filled 2013-12-25: qty 1

## 2013-12-25 MED ORDER — TAMSULOSIN HCL 0.4 MG PO CAPS: 0.4000 mg | ORAL_CAPSULE | Freq: Every day | ORAL | Status: DC

## 2013-12-25 MED ORDER — OXYCODONE-ACETAMINOPHEN 5-325 MG PO TABS: 1.0000 | ORAL_TABLET | ORAL | Status: DC | PRN

## 2013-12-25 MED ORDER — ONDANSETRON HCL 4 MG/2ML IJ SOLN
4.0000 mg | Freq: Once | INTRAMUSCULAR | Status: AC
  Administered 2013-12-25: 4 mg via INTRAVENOUS
  Filled 2013-12-25: qty 2

## 2013-12-25 NOTE — ED Provider Notes (Signed)
CSN: [id number]     Arrival date & time 12/25/13  2011 History  This chart was scribed for Lorraine Peters, * by Jeanell Sparrow, ED Scribe. This patient was seen in room [facility] and the patient's care was started at 8:20 PM.    Chief Complaint  Patient presents with  . Flank Pain   The history is provided by the patient. No language interpreter was used.   HPI Comments: Lorraine Peters is a 43 y.o. female with a hx of kidney stones who presents to the Emergency Department complaining of constant moderate LLQ abdominal pain that started this morning. She reports that she woke up this morning with the pain and it has been waxing and waning today. She states that pain worsened again after she ate. She states that the pain intermittently radiates to her lower back. She reports that she is currently in her menstrual cycle. She denies any constipation, diarrhea, or emesis.   History reviewed. No pertinent past medical history. Past Surgical History  Procedure Laterality Date  . Tubal ligation     No family history on file. History  Substance Use Topics  . Smoking status: Current Every Day Smoker    Types: Cigarettes  . Smokeless tobacco: Not on file  . Alcohol Use: No   OB History   Grav Para Term Preterm Abortions TAB SAB Ect Mult Living                 Review of Systems  Gastrointestinal: Positive for abdominal pain. Negative for vomiting, diarrhea and constipation.  Musculoskeletal: Positive for back pain.  All other systems reviewed and are negative.   Allergies  Bee venom  Home Medications   Prior to Admission medications   Medication Sig Start Date End Date Taking? Authorizing Provider  esomeprazole (NEXIUM) 40 MG capsule Take 40 mg by mouth 2 (two) times daily.    Yes Historical Provider, MD  propranolol (INDERAL) 80 MG tablet Take 80 mg by mouth 2 (two) times daily.   Yes Historical Provider, MD  rosuvastatin (CRESTOR) 20 MG tablet Take 20 mg by mouth daily.    Yes Historical Provider, MD   BP 133/80  Pulse 68  Temp(Src) 97.8 F (36.6 C) (Oral)  Resp 16  Ht 5\' 7"  (1.702 m)  Wt 200 lb (90.719 kg)  BMI 31.32 kg/m2  SpO2 98%  LMP 12/22/2013 Physical Exam  Nursing note and vitals reviewed. Constitutional: She is oriented to person, place, and time. She appears well-developed and well-nourished. No distress.  HENT:  Head: Normocephalic and atraumatic.  Right Ear: Hearing normal.  Left Ear: Hearing normal.  Nose: Nose normal.  Mouth/Throat: Oropharynx is clear and moist and mucous membranes are normal.  Eyes: Conjunctivae and EOM are normal. Pupils are equal, round, and reactive to light.  Neck: Normal range of motion. Neck supple.  Cardiovascular: Regular rhythm, S1 normal and S2 normal.  Exam reveals no gallop and no friction rub.   No murmur heard. Pulmonary/Chest: Effort normal and breath sounds normal. No respiratory distress. She exhibits no tenderness.  Abdominal: Soft. Normal appearance and bowel sounds are normal. There is no hepatosplenomegaly. There is tenderness. There is no rebound, no guarding, no tenderness at McBurney's point and negative Murphy's sign. No hernia.  LLQ TTP with no rebound or guarding.   Musculoskeletal: Normal range of motion.  Neurological: She is alert and oriented to person, place, and time. She has normal strength. No cranial nerve deficit or sensory deficit. Coordination  normal. GCS eye subscore is 4. GCS verbal subscore is 5. GCS motor subscore is 6.  Skin: Skin is warm, dry and intact. No rash noted. No cyanosis.  Psychiatric: She has a normal mood and affect. Her speech is normal and behavior is normal. Thought content normal.    ED Course  Procedures (including critical care time) DIAGNOSTIC STUDIES: Oxygen Saturation is 98% on RA, normal by my interpretation.    COORDINATION OF CARE: 8:24 PM- Pt advised of plan for treatment which includes medication and labs and pt agrees.  Labs Review Labs  Reviewed  BASIC METABOLIC PANEL - Abnormal; Notable for the following:    Glucose, Bld 114 (*)    All other components within normal limits  URINALYSIS, ROUTINE W REFLEX MICROSCOPIC - Abnormal; Notable for the following:    Hgb urine dipstick LARGE (*)    All other components within normal limits  URINE MICROSCOPIC-ADD ON - Abnormal; Notable for the following:    Squamous Epithelial / LPF FEW (*)    All other components within normal limits  CBC WITH DIFFERENTIAL  POC URINE PREG, ED    Imaging Review Ct Renal Stone Study  12/25/2013   CLINICAL DATA:  Left lower quadrant abdominal pain began earlier today, initial evaluation, personal history of kidney stones  EXAM: CT ABDOMEN AND PELVIS WITHOUT CONTRAST  TECHNIQUE: Multidetector CT imaging of the abdomen and pelvis was performed following the standard protocol without IV contrast.  COMPARISON:  11/29/2004  FINDINGS: Visualized portions of the lung bases are clear. Visualized portion of the heart is normal.  Liver is normal. Gallbladder is normal. Spleen is normal. Pancreas is normal.  Stomach is normal. Adrenal glands are normal. 2 mm nonobstructing stone lower pole right kidney. On the left, there is a 2.8 cm low-attenuation renal lesion. This is not characterized without contrast. However, this is similar to a lesion that was present in 2006, which measured about half the size at that time. 2 mm nonobstructing stone lower pole left kidney. 3 mm stone in the left ureter just beyond the ureteral pelvic junction, with mild dilatation of the left renal pelvis and mild perinephric inflammatory change.  Mild calcification of the aorta. Small bowel is normal. Appendix is normal. Large bowel is normal.  Bladder and reproductive organs normal. No acute musculoskeletal findings.  IMPRESSION: Mild hydronephrosis on the left due to stone in the proximal left ureter.  Low-attenuation left renal lesion not fully characterized without contrast. It is likely a  cyst given the considerations described above. This could be confirmed with renal ultrasound.   Electronically Signed   By: Skipper Cliche M.D.   On: 12/25/2013 21:36     EKG Interpretation None      MDM   Final diagnoses:  Left flank pain   Patient presents to ER for evaluation of left flank pain. Patient reports she does have a history of kidney stones, but has not had one in about 13 years. Patient did not have significant tenderness, but did have some tenderness in the left lower quadrant, therefore other etiology was considered. Patient does have evidence of a 3 mm proximal ureteral stone on CT scan with hydronephrosis which explains the patient's symptoms. She is pain-free after ibutilide. Will discharge with Percocet, followup with urology. Return to the ER or go to [facility] ER if she has uncontrolled pain.  I personally performed the services described in this documentation, which was scribed in my presence. The recorded information has been  reviewed and is accurate.      Lorraine Greek, MD 12/25/13 2236

## 2013-12-25 NOTE — ED Notes (Signed)
Pt c/o left flank pain that started earlier today, has been off and on throught the day, worse after eating today

## 2013-12-25 NOTE — Discharge Instructions (Signed)

## 2014-01-03 MED FILL — Oxycodone w/ Acetaminophen Tab 5-325 MG: ORAL | Qty: 6 | Status: AC

## 2014-03-07 ENCOUNTER — Emergency Department (HOSPITAL_COMMUNITY)
Admission: EM | Admit: 2014-03-07 | Discharge: 2014-03-07 | Disposition: A | Discharge status: Home / Self Care | Payer: Self-pay

## 2014-03-08 ENCOUNTER — Emergency Department (HOSPITAL_COMMUNITY)
Admission: EM | Admit: 2014-03-08 | Discharge: 2014-03-08 | Disposition: A | Discharge status: Home / Self Care | Payer: Self-pay | Attending: Emergency Medicine | Admitting: Emergency Medicine

## 2014-03-08 ENCOUNTER — Emergency Department (HOSPITAL_COMMUNITY): Payer: Self-pay

## 2014-03-08 ENCOUNTER — Encounter (HOSPITAL_COMMUNITY): Payer: Self-pay | Admitting: *Deleted

## 2014-03-08 DIAGNOSIS — Z79899 Other long term (current) drug therapy: Secondary | ICD-10-CM | POA: Insufficient documentation

## 2014-03-08 DIAGNOSIS — R319 Hematuria, unspecified: Secondary | ICD-10-CM

## 2014-03-08 DIAGNOSIS — K219 Gastro-esophageal reflux disease without esophagitis: Secondary | ICD-10-CM | POA: Insufficient documentation

## 2014-03-08 DIAGNOSIS — N200 Calculus of kidney: Secondary | ICD-10-CM | POA: Insufficient documentation

## 2014-03-08 DIAGNOSIS — Z3202 Encounter for pregnancy test, result negative: Secondary | ICD-10-CM | POA: Insufficient documentation

## 2014-03-08 DIAGNOSIS — Z9851 Tubal ligation status: Secondary | ICD-10-CM | POA: Insufficient documentation

## 2014-03-08 DIAGNOSIS — Z720 Tobacco use: Secondary | ICD-10-CM | POA: Insufficient documentation

## 2014-03-08 DIAGNOSIS — E780 Pure hypercholesterolemia: Secondary | ICD-10-CM | POA: Insufficient documentation

## 2014-03-08 HISTORY — DX: Reserved for inherently not codable concepts without codable children: IMO0001

## 2014-03-08 HISTORY — DX: Gastro-esophageal reflux disease without esophagitis: K21.9

## 2014-03-08 HISTORY — DX: Pure hypercholesterolemia, unspecified: E78.00

## 2014-03-08 LAB — URINE MICROSCOPIC-ADD ON

## 2014-03-08 LAB — URINALYSIS, ROUTINE W REFLEX MICROSCOPIC
Bilirubin Urine: NEGATIVE
Glucose, UA: NEGATIVE mg/dL
Ketones, ur: NEGATIVE mg/dL
LEUKOCYTES UA: NEGATIVE
NITRITE: NEGATIVE
Specific Gravity, Urine: 1.025 (ref 1.005–1.030)
Urobilinogen, UA: 1 mg/dL (ref 0.0–1.0)
pH: 6.5 (ref 5.0–8.0)

## 2014-03-08 LAB — PREGNANCY, URINE: Preg Test, Ur: NEGATIVE

## 2014-03-08 MED ORDER — OXYCODONE-ACETAMINOPHEN 5-325 MG PO TABS: 1.0000 | ORAL_TABLET | ORAL | Status: DC | PRN

## 2014-03-08 MED ORDER — TAMSULOSIN HCL 0.4 MG PO CAPS: 0.4000 mg | ORAL_CAPSULE | Freq: Every day | ORAL | Status: DC

## 2014-03-08 MED ORDER — ONDANSETRON HCL 4 MG PO TABS: 4.0000 mg | ORAL_TABLET | Freq: Four times a day (QID) | ORAL | Status: DC

## 2014-03-08 NOTE — ED Notes (Signed)
Treated for kidney stone app 1 month ago, has been voiding small amts  And difficulty urinating  Since then.  No fever , no n/v

## 2014-03-08 NOTE — Discharge Instructions (Signed)

## 2014-03-08 NOTE — ED Notes (Signed)
Total of 45 ml via bladder scanner.

## 2014-03-08 NOTE — ED Provider Notes (Signed)
CSN: [id number]     Arrival date & time 03/08/14  1316 History   First MD Initiated Contact with Patient 03/08/14 1548     Chief Complaint  Patient presents with  . Dysuria     (Consider location/radiation/quality/duration/timing/severity/associated sxs/prior Treatment) HPI  Lorraine Peters is a 43 y.o. female who presents to the Emergency Department complaining of urinary retention and suprapubic "pressure" for over one month.  She states that she was seen here in October and diagnosed with a kidney stone, was given Flomax but she states that she did not take it as directed because the severe flank pain she had at that time resolved. She reports her symptoms have been present since.  She is unsure if she passed the stone, did not follow-up with urology.  She denies fever, chills, vomiting or flank pain.     Past Medical History  Diagnosis Date  . Kidney stones   . Hypercholesterolemia   . Reflux    Past Surgical History  Procedure Laterality Date  . Tubal ligation     History reviewed. No pertinent family history. History  Substance Use Topics  . Smoking status: Current Every Day Smoker    Types: Cigarettes  . Smokeless tobacco: Not on file  . Alcohol Use: No   OB History    No data available     Review of Systems  Constitutional: Negative for fever, chills, activity change and appetite change.  Respiratory: Negative for chest tightness and shortness of breath.   Cardiovascular: Negative for chest pain.  Gastrointestinal: Negative for nausea, vomiting and abdominal pain.  Genitourinary: Positive for dysuria and difficulty urinating. Negative for frequency, flank pain, vaginal bleeding, vaginal discharge and vaginal pain.  Musculoskeletal: Negative for back pain and neck pain.  Skin: Negative for rash.  Neurological: Negative for dizziness, weakness, numbness and headaches.  All other systems reviewed and are negative.     Allergies  Bee venom  Home Medications    Prior to Admission medications   Medication Sig Start Date End Date Taking? Authorizing Provider  esomeprazole (NEXIUM) 40 MG capsule Take 40 mg by mouth 2 (two) times daily.     Historical Provider, MD  MAXALT 5 MG tablet  01/10/14   Historical Provider, MD  oxyCODONE-acetaminophen (PERCOCET) 5-325 MG per tablet Take 1-2 tablets by mouth every 4 (four) hours as needed. 12/25/13   [name], MD  oxyCODONE-acetaminophen (PERCOCET) 5-325 MG per tablet Take 1-2 tablets by mouth every 4 (four) hours as needed. 12/25/13   [name], MD  propranolol (INDERAL) 80 MG tablet Take 80 mg by mouth 2 (two) times daily.    Historical Provider, MD  rosuvastatin (CRESTOR) 20 MG tablet Take 20 mg by mouth daily.    Historical Provider, MD  tamsulosin (FLOMAX) 0.4 MG CAPS capsule Take 1 capsule (0.4 mg total) by mouth daily. 12/25/13   [name], MD   BP 125/70 mmHg  Pulse 60  Temp(Src) 98.6 F (37 C) (Oral)  Resp 18  Ht 5\' 7"  (1.702 m)  Wt 200 lb (90.719 kg)  BMI 31.32 kg/m2  SpO2 99%  LMP 03/07/2014 Physical Exam  Constitutional: She is oriented to person, place, and time. She appears well-developed and well-nourished. No distress.  HENT:  Head: Normocephalic and atraumatic.  Neck: Normal range of motion. Neck supple.  Cardiovascular: Normal rate, regular rhythm, normal heart sounds and intact distal pulses.   No murmur heard. Pulmonary/Chest: Effort normal and breath sounds normal. No  respiratory distress.  Abdominal: Soft. Normal appearance. She exhibits no distension and no mass. There is no hepatosplenomegaly. There is no tenderness. There is no rebound, no guarding and no CVA tenderness.  Musculoskeletal: Normal range of motion.  Lymphadenopathy:    She has no cervical adenopathy.  Neurological: She is alert and oriented to person, place, and time. She exhibits normal muscle tone. Coordination normal.  Skin: Skin is warm and dry. No rash noted.   Nursing note and vitals reviewed.   ED Course  Procedures (including critical care time) Labs Review Labs Reviewed  URINALYSIS, ROUTINE W REFLEX MICROSCOPIC - Abnormal; Notable for the following:    Hgb urine dipstick LARGE (*)    Protein, ur TRACE (*)    All other components within normal limits  URINE MICROSCOPIC-ADD ON - Abnormal; Notable for the following:    Squamous Epithelial / LPF FEW (*)    Bacteria, UA FEW (*)    All other components within normal limits  PREGNANCY, URINE    Imaging Review Ct Renal Stone Study  03/08/2014   CLINICAL DATA:  43 year old female with acute left flank discomfort, dysuria and hematuria. History of urinary calculi.  EXAM: CT ABDOMEN AND PELVIS WITHOUT CONTRAST  TECHNIQUE: Multidetector CT imaging of the abdomen and pelvis was performed following the standard protocol without IV contrast.  COMPARISON:  12/25/2013 and prior CTs  FINDINGS: A 2 mm calculus and adjacent slightly distal 3 mm calculus identified within the distal left ureter, approximately 1-2 cm above the left UVJ. There is no evidence of hydronephrosis.  Punctate nonobstructing bilateral renal calculi are identified, at least 2 in the right lower pole and 3 within the mid -lower left kidney.  A probable left renal cyst is again noted.  The liver, spleen, pancreas, gallbladder, and adrenal glands are unremarkable.  Please note that parenchymal abnormalities may be missed without intravenous contrast.  There is no evidence of free fluid, enlarged lymph nodes, biliary dilation or abdominal aortic aneurysm.  The bowel, bladder and appendix are unremarkable. There is no evidence of bowel obstruction, abscess or pneumoperitoneum.  No acute or suspicious bony abnormalities are noted.  IMPRESSION: Two adjacent nonobstructing distal left ureteral calculi (2 mm and 3 mm) -no evidence of hydronephrosis.  Punctate nonobstructing bilateral renal calculi.   Electronically Signed   By: Hassan Rowan M.D.   On:  03/08/2014 17:21     EKG Interpretation None      Urine culture pending MDM   Final diagnoses:  Hematuria  Kidney stone on left side     Patient had a documented left proximal kidney stone in October, has continued urinary hesitancy and difficulty voiding.  CT scan from today now shows 2 ureteral stones w/o hydro.    Pt well appearing, VSS.  No acute abdomen.  She appears stable for d/c and agrees to flomax as directed, strain urine, push fluids, and urology f/u.  Rx for flomax, percocet and zofran.  Advised to return if needed.    Pt is well appearing, non-toxic.  VSS.    Marlaina Coburn L. Vanessa [place]mouth, PA-C 03/09/14 [place], MD 03/14/14 2158

## 2014-03-09 LAB — URINE CULTURE
Colony Count: NO GROWTH
Culture: NO GROWTH

## 2014-04-27 ENCOUNTER — Emergency Department (HOSPITAL_COMMUNITY)
Admission: EM | Admit: 2014-04-27 | Discharge: 2014-04-27 | Disposition: A | Discharge status: Home / Self Care | Payer: Self-pay | Attending: Emergency Medicine | Admitting: Emergency Medicine

## 2014-04-27 ENCOUNTER — Encounter (HOSPITAL_COMMUNITY): Payer: Self-pay | Admitting: *Deleted

## 2014-04-27 ENCOUNTER — Emergency Department (HOSPITAL_COMMUNITY): Payer: Self-pay

## 2014-04-27 DIAGNOSIS — Z3202 Encounter for pregnancy test, result negative: Secondary | ICD-10-CM | POA: Insufficient documentation

## 2014-04-27 DIAGNOSIS — Z79899 Other long term (current) drug therapy: Secondary | ICD-10-CM | POA: Insufficient documentation

## 2014-04-27 DIAGNOSIS — N200 Calculus of kidney: Secondary | ICD-10-CM | POA: Insufficient documentation

## 2014-04-27 DIAGNOSIS — E780 Pure hypercholesterolemia: Secondary | ICD-10-CM | POA: Insufficient documentation

## 2014-04-27 DIAGNOSIS — K219 Gastro-esophageal reflux disease without esophagitis: Secondary | ICD-10-CM | POA: Insufficient documentation

## 2014-04-27 DIAGNOSIS — Z720 Tobacco use: Secondary | ICD-10-CM | POA: Insufficient documentation

## 2014-04-27 LAB — COMPREHENSIVE METABOLIC PANEL
ALT: 17 U/L (ref 0–35)
ANION GAP: 6 (ref 5–15)
AST: 18 U/L (ref 0–37)
Albumin: 3.8 g/dL (ref 3.5–5.2)
Alkaline Phosphatase: 75 U/L (ref 39–117)
BUN: 13 mg/dL (ref 6–23)
CO2: 26 mmol/L (ref 19–32)
Calcium: 9.1 mg/dL (ref 8.4–10.5)
Chloride: 105 mmol/L (ref 96–112)
Creatinine, Ser: 0.74 mg/dL (ref 0.50–1.10)
GFR calc non Af Amer: >90 mL/min (ref >90)
GLUCOSE: 111 mg/dL — AB (ref 70–99)
POTASSIUM: 3.8 mmol/L (ref 3.5–5.1)
Sodium: 137 mmol/L (ref 135–145)
TOTAL PROTEIN: 6.8 g/dL (ref 6.0–8.3)
Total Bilirubin: 0.5 mg/dL (ref 0.3–1.2)

## 2014-04-27 LAB — URINALYSIS, ROUTINE W REFLEX MICROSCOPIC
GLUCOSE, UA: NEGATIVE mg/dL
Leukocytes, UA: NEGATIVE
Nitrite: NEGATIVE
PROTEIN: 30 mg/dL — AB
Specific Gravity, Urine: >1.03 — ABNORMAL HIGH (ref 1.005–1.030)
Urobilinogen, UA: 0.2 mg/dL (ref 0.0–1.0)
pH: 5.5 (ref 5.0–8.0)

## 2014-04-27 LAB — CBC WITH DIFFERENTIAL/PLATELET
BASOS PCT: 0 % (ref 0–1)
Basophils Absolute: 0 10*3/uL (ref 0.0–0.1)
EOS ABS: 0.1 10*3/uL (ref 0.0–0.7)
Eosinophils Relative: 1 % (ref 0–5)
HEMATOCRIT: 43 % (ref 36.0–46.0)
HEMOGLOBIN: 14.2 g/dL (ref 12.0–15.0)
Lymphocytes Relative: 25 % (ref 12–46)
Lymphs Abs: 2.4 10*3/uL (ref 0.7–4.0)
MCH: 29 pg (ref 26.0–34.0)
MCHC: 33 g/dL (ref 30.0–36.0)
MCV: 87.8 fL (ref 78.0–100.0)
MONO ABS: 0.8 10*3/uL (ref 0.1–1.0)
Monocytes Relative: 8 % (ref 3–12)
NEUTROS PCT: 66 % (ref 43–77)
Neutro Abs: 6.5 10*3/uL (ref 1.7–7.7)
Platelets: 259 10*3/uL (ref 150–400)
RBC: 4.9 MIL/uL (ref 3.87–5.11)
RDW: 13.9 % (ref 11.5–15.5)
WBC: 9.8 10*3/uL (ref 4.0–10.5)

## 2014-04-27 LAB — PREGNANCY, URINE: Preg Test, Ur: NEGATIVE

## 2014-04-27 LAB — URINE MICROSCOPIC-ADD ON

## 2014-04-27 MED ORDER — CEPHALEXIN 500 MG PO CAPS: 500.0000 mg | ORAL_CAPSULE | Freq: Three times a day (TID) | ORAL | Status: DC

## 2014-04-27 MED ORDER — FENTANYL CITRATE 0.05 MG/ML IJ SOLN
100.0000 ug | Freq: Once | INTRAMUSCULAR | Status: AC
  Administered 2014-04-27: 100 ug via INTRAVENOUS

## 2014-04-27 MED ORDER — FENTANYL CITRATE 0.05 MG/ML IJ SOLN
INTRAMUSCULAR | Status: AC
  Administered 2014-04-27: 100 ug via INTRAVENOUS
  Filled 2014-04-27: qty 2

## 2014-04-27 NOTE — Discharge Instructions (Signed)
Follow up with your family md next week for recheck °

## 2014-04-27 NOTE — ED Notes (Addendum)
Pressure to bladder x 2 days, constant. States left flank pain began yesterday. Vomited last at 1100 last night. Noticed a little blood yesterday. NAD. Pt states she took half a percocet just PTA.

## 2014-04-27 NOTE — ED Provider Notes (Signed)
CSN: [id number]     Arrival date & time 04/27/14  1006 History  This chart was scribed for Maudry Diego, MD by Chester Holstein, ED Scribe. This patient was seen in room APA08/AP[facility]08 and the patient's care was started at 11:44 AM.    Chief Complaint  Patient presents with  . Flank Pain     Patient is a 44 y.o. female presenting with flank pain. The history is provided by the patient (the pt complains of left flank pain.  hx kidney stones). No language interpreter was used.  Flank Pain This is a recurrent problem. The current episode started 12 to 24 hours ago. The problem occurs constantly. The problem has been gradually improving. Pertinent negatives include no chest pain, no abdominal pain and no headaches. Nothing aggravates the symptoms. Nothing relieves the symptoms.   HPI Comments: Lorraine Peters is a 44 y.o. female who presents to the Emergency Department complaining of left sided flank pain with onset last night.  Pt notes associated bladder pressure for 2 days and vomiting at onset of pain. Pt likens pain to h/o nephrolithiasis. Pt notes bladder pressure is typical symptom preceding kidney stones for her. Pt notes recent kidney stones in 02/2014. Pt denies any other medical complaints.   Past Medical History  Diagnosis Date  . Kidney stones   . Hypercholesterolemia   . Reflux    Past Surgical History  Procedure Laterality Date  . Tubal ligation     No family history on file. History  Substance Use Topics  . Smoking status: Current Some Day Smoker    Types: Cigarettes  . Smokeless tobacco: Not on file  . Alcohol Use: No   OB History    No data available     Review of Systems  Constitutional: Negative for appetite change and fatigue.  HENT: Negative for congestion, ear discharge and sinus pressure.   Eyes: Negative for discharge.  Respiratory: Negative for cough.   Cardiovascular: Negative for chest pain.  Gastrointestinal: Positive for vomiting. Negative for  abdominal pain and diarrhea.  Genitourinary: Positive for flank pain. Negative for frequency and hematuria.  Musculoskeletal: Negative for back pain.  Skin: Negative for rash.  Neurological: Negative for seizures and headaches.  Psychiatric/Behavioral: Negative for hallucinations.      Allergies  Bee venom  Home Medications   Prior to Admission medications   Medication Sig Start Date End Date Taking? Authorizing Provider  esomeprazole (NEXIUM) 40 MG capsule Take 40 mg by mouth 2 (two) times daily.     Historical Provider, MD  MAXALT 5 MG tablet Take 5 mg by mouth as needed for migraine.  01/10/14   Historical Provider, MD  ondansetron (ZOFRAN) 4 MG tablet Take 1 tablet (4 mg total) by mouth every 6 (six) hours. Prn nausea/vomiting 03/08/14   Tammy L. Triplett, PA-C  oxyCODONE-acetaminophen (PERCOCET/ROXICET) 5-325 MG per tablet Take 1 tablet by mouth every 4 (four) hours as needed. 03/08/14   Tammy L. Triplett, PA-C  propranolol (INDERAL) 80 MG tablet Take 80 mg by mouth 2 (two) times daily.    Historical Provider, MD  rosuvastatin (CRESTOR) 20 MG tablet Take 20 mg by mouth every evening.     Historical Provider, MD  tamsulosin (FLOMAX) 0.4 MG CAPS capsule Take 1 capsule (0.4 mg total) by mouth daily. 03/08/14   Tammy L. Triplett, PA-C   BP 145/76 mmHg  Pulse 96  Temp(Src) 98.2 F (36.8 C) (Oral)  Resp 16  Ht 5\' 8"  (1.727 m)  Wt 200 lb (90.719 kg)  BMI 30.42 kg/m2  SpO2 99%  LMP 03/30/2014 Physical Exam  Constitutional: She is oriented to person, place, and time. She appears well-developed and well-nourished.  HENT:  Head: Normocephalic.  Eyes: Conjunctivae and EOM are normal. No scleral icterus.  Neck: Neck supple. No thyromegaly present.  Cardiovascular: Normal rate and regular rhythm.  Exam reveals no gallop and no friction rub.   No murmur heard. Pulmonary/Chest: No stridor. She has no wheezes. She has no rales. She exhibits no tenderness.  Abdominal: She exhibits no  distension. There is tenderness (mild) in the left lower quadrant. There is no rebound.  Musculoskeletal: Normal range of motion. She exhibits no edema.  Lymphadenopathy:    She has no cervical adenopathy.  Neurological: She is alert and oriented to person, place, and time. She exhibits normal muscle tone. Coordination normal.  Skin: No rash noted. No erythema.  Psychiatric: She has a normal mood and affect. Her behavior is normal.  Nursing note and vitals reviewed.   ED Course  Procedures (including critical care time) DIAGNOSTIC STUDIES: Oxygen Saturation is 99% on room air, normal by my interpretation.    COORDINATION OF CARE: 11:47 AM Discussed treatment plan with patient at beside, the patient agrees with the plan and has no further questions at this time.   Labs Review Labs Reviewed  URINALYSIS, ROUTINE W REFLEX MICROSCOPIC - Abnormal; Notable for the following:    Specific Gravity, Urine >1.030 (*)    Hgb urine dipstick MODERATE (*)    Bilirubin Urine SMALL (*)    Ketones, ur TRACE (*)    Protein, ur 30 (*)    All other components within normal limits  PREGNANCY, URINE  URINE MICROSCOPIC-ADD ON    Imaging Review No results found.   EKG Interpretation None      MDM   Final diagnoses:  None    Possibly passed kidney stone.  Pt to follow up with pcp.  Will cover with keflex   The chart was scribed for me under my direct supervision.  I personally performed the history, physical, and medical decision making and all procedures in the evaluation of this patient.Maudry Diego, MD 04/27/14 [phone]

## 2014-04-27 NOTE — ED Notes (Signed)
Pt in distress, pain 10/10 on left side, pt states feels like spasms in lower bladder

## 2014-07-05 ENCOUNTER — Other Ambulatory Visit (HOSPITAL_COMMUNITY): Payer: Self-pay | Admitting: Physician Assistant

## 2014-07-05 DIAGNOSIS — N926 Irregular menstruation, unspecified: Secondary | ICD-10-CM

## 2014-07-08 ENCOUNTER — Other Ambulatory Visit (HOSPITAL_COMMUNITY): Payer: Self-pay

## 2014-07-12 ENCOUNTER — Ambulatory Visit (HOSPITAL_COMMUNITY)
Admission: RE | Admit: 2014-07-12 | Discharge: 2014-07-12 | Disposition: A | Discharge status: Home / Self Care | Payer: Self-pay | Source: Ambulatory Visit | Attending: Physician Assistant | Admitting: Physician Assistant

## 2014-07-12 ENCOUNTER — Other Ambulatory Visit (HOSPITAL_COMMUNITY): Payer: Self-pay

## 2014-07-12 DIAGNOSIS — R938 Abnormal findings on diagnostic imaging of other specified body structures: Secondary | ICD-10-CM | POA: Insufficient documentation

## 2014-07-12 DIAGNOSIS — N926 Irregular menstruation, unspecified: Secondary | ICD-10-CM | POA: Insufficient documentation

## 2014-08-21 ENCOUNTER — Emergency Department (HOSPITAL_COMMUNITY)
Admission: EM | Admit: 2014-08-21 | Discharge: 2014-08-21 | Disposition: A | Discharge status: Home / Self Care | Payer: Self-pay | Attending: Emergency Medicine | Admitting: Emergency Medicine

## 2014-08-21 ENCOUNTER — Emergency Department (HOSPITAL_COMMUNITY): Payer: Self-pay

## 2014-08-21 ENCOUNTER — Encounter (HOSPITAL_COMMUNITY): Payer: Self-pay | Admitting: Emergency Medicine

## 2014-08-21 DIAGNOSIS — Z720 Tobacco use: Secondary | ICD-10-CM | POA: Insufficient documentation

## 2014-08-21 DIAGNOSIS — Z79899 Other long term (current) drug therapy: Secondary | ICD-10-CM | POA: Insufficient documentation

## 2014-08-21 DIAGNOSIS — Z3202 Encounter for pregnancy test, result negative: Secondary | ICD-10-CM | POA: Insufficient documentation

## 2014-08-21 DIAGNOSIS — Z87442 Personal history of urinary calculi: Secondary | ICD-10-CM | POA: Insufficient documentation

## 2014-08-21 DIAGNOSIS — R1012 Left upper quadrant pain: Secondary | ICD-10-CM

## 2014-08-21 DIAGNOSIS — R05 Cough: Secondary | ICD-10-CM | POA: Insufficient documentation

## 2014-08-21 DIAGNOSIS — E780 Pure hypercholesterolemia: Secondary | ICD-10-CM | POA: Insufficient documentation

## 2014-08-21 DIAGNOSIS — Z792 Long term (current) use of antibiotics: Secondary | ICD-10-CM | POA: Insufficient documentation

## 2014-08-21 DIAGNOSIS — N390 Urinary tract infection, site not specified: Secondary | ICD-10-CM

## 2014-08-21 DIAGNOSIS — K219 Gastro-esophageal reflux disease without esophagitis: Secondary | ICD-10-CM | POA: Insufficient documentation

## 2014-08-21 HISTORY — DX: Unspecified ovarian cyst, unspecified side: N83.209

## 2014-08-21 LAB — CBC WITH DIFFERENTIAL/PLATELET
BASOS ABS: 0 10*3/uL (ref 0.0–0.1)
BASOS PCT: 0 % (ref 0–1)
Eosinophils Absolute: 0.1 10*3/uL (ref 0.0–0.7)
Eosinophils Relative: 1 % (ref 0–5)
HCT: 43 % (ref 36.0–46.0)
HEMOGLOBIN: 13.9 g/dL (ref 12.0–15.0)
LYMPHS ABS: 3.1 10*3/uL (ref 0.7–4.0)
Lymphocytes Relative: 30 % (ref 12–46)
MCH: 28.4 pg (ref 26.0–34.0)
MCHC: 32.3 g/dL (ref 30.0–36.0)
MCV: 87.8 fL (ref 78.0–100.0)
MONO ABS: 1 10*3/uL (ref 0.1–1.0)
MONOS PCT: 9 % (ref 3–12)
NEUTROS PCT: 60 % (ref 43–77)
Neutro Abs: 6.2 10*3/uL (ref 1.7–7.7)
PLATELETS: 276 10*3/uL (ref 150–400)
RBC: 4.9 MIL/uL (ref 3.87–5.11)
RDW: 14.3 % (ref 11.5–15.5)
WBC: 10.4 10*3/uL (ref 4.0–10.5)

## 2014-08-21 LAB — URINALYSIS, ROUTINE W REFLEX MICROSCOPIC
Bilirubin Urine: NEGATIVE
Glucose, UA: NEGATIVE mg/dL
NITRITE: NEGATIVE
Specific Gravity, Urine: >1.03 — ABNORMAL HIGH (ref 1.005–1.030)
UROBILINOGEN UA: 0.2 mg/dL (ref 0.0–1.0)
pH: 5.5 (ref 5.0–8.0)

## 2014-08-21 LAB — COMPREHENSIVE METABOLIC PANEL
ALT: 18 U/L (ref 14–54)
ANION GAP: 8 (ref 5–15)
AST: 17 U/L (ref 15–41)
Albumin: 3.6 g/dL (ref 3.5–5.0)
Alkaline Phosphatase: 70 U/L (ref 38–126)
BUN: 12 mg/dL (ref 6–20)
CHLORIDE: 105 mmol/L (ref 101–111)
CO2: 26 mmol/L (ref 22–32)
Calcium: 9.2 mg/dL (ref 8.9–10.3)
Creatinine, Ser: 0.58 mg/dL (ref 0.44–1.00)
GFR calc Af Amer: >60 mL/min (ref >60)
GFR calc non Af Amer: >60 mL/min (ref >60)
Glucose, Bld: 89 mg/dL (ref 65–99)
Potassium: 4.1 mmol/L (ref 3.5–5.1)
Sodium: 139 mmol/L (ref 135–145)
TOTAL PROTEIN: 6.4 g/dL — AB (ref 6.5–8.1)
Total Bilirubin: 0.4 mg/dL (ref 0.3–1.2)

## 2014-08-21 LAB — URINE MICROSCOPIC-ADD ON

## 2014-08-21 LAB — LIPASE, BLOOD: Lipase: 28 U/L (ref 22–51)

## 2014-08-21 LAB — TROPONIN I: Troponin I: <0.03 ng/mL (ref <0.031)

## 2014-08-21 LAB — PREGNANCY, URINE: Preg Test, Ur: NEGATIVE

## 2014-08-21 MED ORDER — NITROFURANTOIN MONOHYD MACRO 100 MG PO CAPS: 100.0000 mg | ORAL_CAPSULE | Freq: Two times a day (BID) | ORAL | Status: DC

## 2014-08-21 NOTE — ED Provider Notes (Signed)
CSN: [id number]     Arrival date & time 08/21/14  1241 History   First MD Initiated Contact with Patient 08/21/14 1357     Chief Complaint  Patient presents with  . Abdominal Pain     (Consider location/radiation/quality/duration/timing/severity/associated sxs/prior Treatment) HPI. 44 year old female presents with left upper quadrant abdominal pain for the past 2 days. The patient states that she has not had any nausea, vomiting, or diarrhea. She thought she was constipated but after taking a laxative and going to the bathroom her symptoms did not change. It feels like she is bloated and she has a cramping sensation. Does not worsen with eating or drinking. Laying flat the pain better and sitting up makes it worse. No exertional symptoms and no chest pain or shortness of breath. She has been coughing for the past 2 weeks although this is improving and she thinks it is her typical bronchitis. She is concerned that she has a UTI because she has been having burning at the end of urination for the past couple days with urinary frequency. She declines pain treatment in the ER.  Past Medical History  Diagnosis Date  . Kidney stones   . Hypercholesterolemia   . Reflux   . Ovarian cyst    Past Surgical History  Procedure Laterality Date  . Tubal ligation     History reviewed. No pertinent family history. History  Substance Use Topics  . Smoking status: Current Some Day Smoker -- 0.50 packs/day    Types: Cigarettes  . Smokeless tobacco: Not on file  . Alcohol Use: No   OB History    Gravida Para Term Preterm AB TAB SAB Ectopic Multiple Living            1     Review of Systems  Constitutional: Negative for fever.  Respiratory: Positive for cough. Negative for shortness of breath.   Cardiovascular: Negative for chest pain.  Gastrointestinal: Positive for abdominal pain. Negative for nausea, vomiting, diarrhea and constipation.  Genitourinary: Positive for dysuria and frequency.   Musculoskeletal: Negative for back pain.  All other systems reviewed and are negative.     Allergies  Bee venom  Home Medications   Prior to Admission medications   Medication Sig Start Date End Date Taking? Authorizing Provider  cephALEXin (KEFLEX) 500 MG capsule Take 1 capsule (500 mg total) by mouth 3 (three) times daily. 04/27/14   Milton Ferguson, MD  esomeprazole (NEXIUM) 40 MG capsule Take 40 mg by mouth 2 (two) times daily.     Historical Provider, MD  MAXALT 5 MG tablet Take 5 mg by mouth as needed for migraine.  01/10/14   Historical Provider, MD  ondansetron (ZOFRAN) 4 MG tablet Take 1 tablet (4 mg total) by mouth every 6 (six) hours. Prn nausea/vomiting 03/08/14   Tammy Triplett, PA-C  oxyCODONE-acetaminophen (PERCOCET/ROXICET) 5-325 MG per tablet Take 1 tablet by mouth every 4 (four) hours as needed. Patient taking differently: Take 1 tablet by mouth every 4 (four) hours as needed (pain).  03/08/14   Tammy Triplett, PA-C  propranolol (INDERAL) 80 MG tablet Take 80 mg by mouth 2 (two) times daily.    Historical Provider, MD  rosuvastatin (CRESTOR) 20 MG tablet Take 20 mg by mouth every evening.     Historical Provider, MD  tamsulosin (FLOMAX) 0.4 MG CAPS capsule Take 1 capsule (0.4 mg total) by mouth daily. Patient not taking: Reported on 04/27/2014 03/08/14   Tammy Triplett, PA-C   BP 132/78 mmHg  Pulse 73  Temp(Src) 98 F (36.7 C) (Oral)  Resp 18  Ht 5\' 8"  (1.727 m)  Wt 220 lb (99.791 kg)  BMI 33.46 kg/m2  SpO2 98%  LMP 08/15/2014 Physical Exam  Constitutional: She is oriented to person, place, and time. She appears well-developed and well-nourished.  HENT:  Head: Normocephalic and atraumatic.  Right Ear: External ear normal.  Left Ear: External ear normal.  Nose: Nose normal.  Eyes: Right eye exhibits no discharge. Left eye exhibits no discharge.  Cardiovascular: Normal rate, regular rhythm and normal heart sounds.   Pulmonary/Chest: Effort normal and breath  sounds normal. She has no wheezes. She has no rales.  Abdominal: Soft. She exhibits no distension. There is no tenderness.  Neurological: She is alert and oriented to person, place, and time.  Skin: Skin is warm and dry.  Nursing note and vitals reviewed.   ED Course  Procedures (including critical care time) Labs Review Labs Reviewed  URINALYSIS, ROUTINE W REFLEX MICROSCOPIC (NOT AT [facility]) - Abnormal; Notable for the following:    APPearance HAZY (*)    Specific Gravity, Urine >1.030 (*)    Hgb urine dipstick SMALL (*)    Ketones, ur TRACE (*)    Protein, ur TRACE (*)    Leukocytes, UA TRACE (*)    All other components within normal limits  COMPREHENSIVE METABOLIC PANEL - Abnormal; Notable for the following:    Total Protein 6.4 (*)    All other components within normal limits  URINE MICROSCOPIC-ADD ON - Abnormal; Notable for the following:    Squamous Epithelial / LPF MANY (*)    Bacteria, UA FEW (*)    Crystals CA OXALATE CRYSTALS (*)    All other components within normal limits  URINE CULTURE  CBC WITH DIFFERENTIAL/PLATELET  LIPASE, BLOOD  TROPONIN I  PREGNANCY, URINE    Imaging Review Dg Chest 2 View  08/21/2014   CLINICAL DATA:  Cough, abdominal pain and bloating.  EXAM: CHEST  2 VIEW  COMPARISON:  07/26/2013 and prior chest radiographs  FINDINGS: The cardiomediastinal silhouette is unremarkable.  There is no evidence of focal airspace disease, pulmonary edema, suspicious pulmonary nodule/mass, pleural effusion, or pneumothorax. No acute bony abnormalities are identified.  IMPRESSION: No active cardiopulmonary disease.   Electronically Signed   By: Margarette Canada M.D.   On: 08/21/2014 16:00     EKG Interpretation   Date/Time:  Sunday August 21 2014 14:17:06 EDT Ventricular Rate:  52 PR Interval:  169 QRS Duration: 91 QT Interval:  422 QTC Calculation: 392 R Axis:   78 Text Interpretation:  Sinus arrhythmia Baseline wander in lead(s) V6  Otherwise normal ECG No old  tracing to compare Confirmed by Meher Kucinski  MD,  Junetta Hearn ([id number]) on 08/21/2014 2:42:21 PM      MDM   Final diagnoses:  Abdominal pain, LUQ (left upper quadrant)  UTI (lower urinary tract infection)    It is unclear why patient is having left upper quadrant abdominal pain and bloating. Her exam is unremarkable at this time. Could be related to her significant amount of coughing over the past 2 weeks although I would expect it to be more reproducible. There are no exertional symptoms or concern for atypical ACS. Her EKG and troponin are negative and thus I find this to be very unlikely. No risk factors for pulmonary embolism and she is not tachycardic or hypoxic. Given no significant tenderness I do not feel a CT of her abdomen and pelvis would be  helpful. She is symptomatic for a UTI and while she does have a 30 Urine I will treat empirically and send urine for culture. Recommend PPIs and further over-the-counter treatment for her pain. She will follow-up with her PCP as needed.    Sherwood Gambler, MD 08/21/14 [phone]

## 2014-08-21 NOTE — Discharge Instructions (Signed)
Abdominal Pain °Many things can cause abdominal pain. Usually, abdominal pain is not caused by a disease and will improve without treatment. It can often be observed and treated at home. Your health care provider will do a physical exam and possibly order blood tests and X-rays to help determine the seriousness of your pain. However, in many cases, more time must pass before a clear cause of the pain can be found. Before that point, your health care provider may not know if you need more testing or further treatment. °HOME CARE INSTRUCTIONS  °Monitor your abdominal pain for any changes. The following actions may help to alleviate any discomfort you are experiencing: °· Only take over-the-counter or prescription medicines as directed by your health care provider. °· Do not take laxatives unless directed to do so by your health care provider. °· Try a clear liquid diet (broth, tea, or water) as directed by your health care provider. Slowly move to a bland diet as tolerated. °SEEK MEDICAL CARE IF: °· You have unexplained abdominal pain. °· You have abdominal pain associated with nausea or diarrhea. °· You have pain when you urinate or have a bowel movement. °· You experience abdominal pain that wakes you in the night. °· You have abdominal pain that is worsened or improved by eating food. °· You have abdominal pain that is worsened with eating fatty foods. °· You have a fever. °SEEK IMMEDIATE MEDICAL CARE IF:  °· Your pain does not go away within 2 hours. °· You keep throwing up (vomiting). °· Your pain is felt only in portions of the abdomen, such as the right side or the left lower portion of the abdomen. °· You pass bloody or black tarry stools. °MAKE SURE YOU: °· Understand these instructions.   °· Will watch your condition.   °· Will get help right away if you are not doing well or get worse.   °Document Released: 12/05/2004 Document Revised: 03/02/2013 Document Reviewed: 11/04/2012 °[employer]® Patient Information  ©2015 [employer]. This information is not intended to replace advice given to you by your health care provider. Make sure you discuss any questions you have with your health care provider. °Urinary Tract Infection °Urinary tract infections (UTIs) can develop anywhere along your urinary tract. Your urinary tract is your body's drainage system for removing wastes and extra water. Your urinary tract includes two kidneys, two ureters, a bladder, and a urethra. Your kidneys are a pair of bean-shaped organs. Each kidney is about the size of your fist. They are located below your ribs, one on each side of your spine. °CAUSES °Infections are caused by microbes, which are microscopic organisms, including fungi, viruses, and bacteria. These organisms are so small that they can only be seen through a microscope. Bacteria are the microbes that most commonly cause UTIs. °SYMPTOMS  °Symptoms of UTIs may vary by age and gender of the patient and by the location of the infection. Symptoms in young women typically include a frequent and intense urge to urinate and a painful, burning feeling in the bladder or urethra during urination. Older women and men are more likely to be tired, shaky, and weak and have muscle aches and abdominal pain. A fever may mean the infection is in your kidneys. Other symptoms of a kidney infection include pain in your back or sides below the ribs, nausea, and vomiting. °DIAGNOSIS °To diagnose a UTI, your caregiver will ask you about your symptoms. Your caregiver also will ask to provide a urine sample. The urine sample   will be tested for bacteria and white blood cells. White blood cells are made by your body to help fight infection. °TREATMENT  °Typically, UTIs can be treated with medication. Because most UTIs are caused by a bacterial infection, they usually can be treated with the use of antibiotics. The choice of antibiotic and length of treatment depend on your symptoms and the type of bacteria  causing your infection. °HOME CARE INSTRUCTIONS °· If you were prescribed antibiotics, take them exactly as your caregiver instructs you. Finish the medication even if you feel better after you have only taken some of the medication. °· Drink enough water and fluids to keep your urine clear or pale yellow. °· Avoid caffeine, tea, and carbonated beverages. They tend to irritate your bladder. °· Empty your bladder often. Avoid holding urine for long periods of time. °· Empty your bladder before and after sexual intercourse. °· After a bowel movement, women should cleanse from front to back. Use each tissue only once. °SEEK MEDICAL CARE IF:  °· You have back pain. °· You develop a fever. °· Your symptoms do not begin to resolve within 3 days. °SEEK IMMEDIATE MEDICAL CARE IF:  °· You have severe back pain or lower abdominal pain. °· You develop chills. °· You have nausea or vomiting. °· You have continued burning or discomfort with urination. °MAKE SURE YOU:  °· Understand these instructions. °· Will watch your condition. °· Will get help right away if you are not doing well or get worse. °Document Released: 12/05/2004 Document Revised: 08/27/2011 Document Reviewed: 04/05/2011 °ExitCare® Patient Information ©2015 [employer]. This information is not intended to replace advice given to you by your health care provider. Make sure you discuss any questions you have with your health care provider. ° °

## 2014-08-21 NOTE — ED Notes (Signed)
PT c/o LUQ abdominal pain and bloating with no n/v/d x5 days. PT also states thought of UTI d/t increased urinary frequency and painful urination x1 day.

## 2014-08-24 LAB — URINE CULTURE

## 2014-08-25 ENCOUNTER — Telehealth (HOSPITAL_BASED_OUTPATIENT_CLINIC_OR_DEPARTMENT_OTHER): Payer: Self-pay | Admitting: Emergency Medicine

## 2014-08-25 NOTE — Telephone Encounter (Signed)
Post ED Visit - Positive Culture Follow-up  Culture report reviewed by antimicrobial stewardship pharmacist: []  Wes [place], Pharm.D., BCPS [x]  Heide Guile, Pharm.D., BCPS []  Alycia Rossetti, Pharm.D., BCPS []  [place], Pharm.D., BCPS, AAHIVP []  Legrand [place], Pharm.D., BCPS, AAHIVP []  Isac Sarna, Pharm.D., BCPS  Positive urine  Culture Group B strep Treated with nitrofurantoin, organism sensitive to the same and no further patient follow-up is required at this time.  Hazle Nordmann 08/25/2014, 12:02 PM

## 2014-10-19 ENCOUNTER — Other Ambulatory Visit (HOSPITAL_COMMUNITY): Payer: Self-pay | Admitting: Physician Assistant

## 2014-10-19 DIAGNOSIS — Z1231 Encounter for screening mammogram for malignant neoplasm of breast: Secondary | ICD-10-CM

## 2014-11-21 ENCOUNTER — Ambulatory Visit (HOSPITAL_COMMUNITY): Payer: Self-pay

## 2014-11-21 ENCOUNTER — Ambulatory Visit (HOSPITAL_COMMUNITY)
Admission: RE | Admit: 2014-11-21 | Discharge: 2014-11-21 | Disposition: A | Discharge status: Home / Self Care | Payer: Self-pay | Source: Ambulatory Visit | Attending: Physician Assistant | Admitting: Physician Assistant

## 2014-11-21 DIAGNOSIS — Z1231 Encounter for screening mammogram for malignant neoplasm of breast: Secondary | ICD-10-CM

## 2015-01-29 ENCOUNTER — Other Ambulatory Visit: Payer: Self-pay | Admitting: Physician Assistant

## 2015-01-31 ENCOUNTER — Other Ambulatory Visit: Payer: Self-pay | Admitting: Physician Assistant

## 2015-01-31 MED ORDER — ATORVASTATIN CALCIUM 20 MG PO TABS: 20.0000 mg | ORAL_TABLET | Freq: Every day | ORAL | Status: DC

## 2015-02-15 ENCOUNTER — Other Ambulatory Visit: Payer: Self-pay | Admitting: Physician Assistant

## 2015-02-15 LAB — COMPREHENSIVE METABOLIC PANEL
ALT: 13 U/L (ref 6–29)
AST: 11 U/L (ref 10–35)
Albumin: 3.6 g/dL (ref 3.6–5.1)
Alkaline Phosphatase: 68 U/L (ref 33–115)
BUN: 10 mg/dL (ref 7–25)
CHLORIDE: 103 mmol/L (ref 98–110)
CO2: 25 mmol/L (ref 20–31)
CREATININE: 0.54 mg/dL (ref 0.50–1.10)
Calcium: 8.9 mg/dL (ref 8.6–10.2)
GLUCOSE: 100 mg/dL — AB (ref 65–99)
Potassium: 5 mmol/L (ref 3.5–5.3)
SODIUM: 138 mmol/L (ref 135–146)
Total Bilirubin: 0.4 mg/dL (ref 0.2–1.2)
Total Protein: 6 g/dL — ABNORMAL LOW (ref 6.1–8.1)

## 2015-02-15 LAB — LIPID PANEL
Cholesterol: 112 mg/dL — ABNORMAL LOW (ref 125–200)
HDL: 35 mg/dL — ABNORMAL LOW (ref >=46)
LDL CALC: 49 mg/dL (ref <130)
Total CHOL/HDL Ratio: 3.2 Ratio (ref <=5.0)
Triglycerides: 142 mg/dL (ref <150)
VLDL: 28 mg/dL (ref <30)

## 2015-02-21 ENCOUNTER — Ambulatory Visit: Payer: Self-pay | Admitting: Physician Assistant

## 2015-02-21 ENCOUNTER — Encounter: Payer: Self-pay | Admitting: Physician Assistant

## 2015-02-21 VITALS — BP 124/86 | HR 74 | Temp 97.7°F | Ht 65.0 in | Wt 234.0 lb

## 2015-02-21 DIAGNOSIS — E669 Obesity, unspecified: Secondary | ICD-10-CM | POA: Insufficient documentation

## 2015-02-21 DIAGNOSIS — F17210 Nicotine dependence, cigarettes, uncomplicated: Secondary | ICD-10-CM | POA: Insufficient documentation

## 2015-02-21 DIAGNOSIS — R300 Dysuria: Secondary | ICD-10-CM

## 2015-02-21 DIAGNOSIS — N924 Excessive bleeding in the premenopausal period: Secondary | ICD-10-CM | POA: Insufficient documentation

## 2015-02-21 DIAGNOSIS — E785 Hyperlipidemia, unspecified: Secondary | ICD-10-CM | POA: Insufficient documentation

## 2015-02-21 DIAGNOSIS — K219 Gastro-esophageal reflux disease without esophagitis: Secondary | ICD-10-CM | POA: Insufficient documentation

## 2015-02-21 HISTORY — DX: Nicotine dependence, cigarettes, uncomplicated: F17.210

## 2015-02-21 LAB — POCT URINALYSIS DIPSTICK
BILIRUBIN UA: NEGATIVE
Blood, UA: NEGATIVE
GLUCOSE UA: NEGATIVE
Ketones, UA: NEGATIVE
LEUKOCYTES UA: NEGATIVE
NITRITE UA: NEGATIVE
PH UA: 5.5
Protein, UA: NEGATIVE
Spec Grav, UA: 1.025
Urobilinogen, UA: 1

## 2015-02-21 NOTE — Progress Notes (Signed)
BP 124/86 mmHg  Pulse 74  Temp(Src) 97.7 F (36.5 C)  Ht 5\' 5"  (1.651 m)  Wt 234 lb (106.142 kg)  BMI 38.94 kg/m2  SpO2 97%   Subjective:    Patient ID: Lorraine Peters, female    DOB: 11-27-1970, 44 y.o.   MRN: [id number]  HPI: Lorraine Peters is a 44 y.o. female presenting on 02/21/2015 for Hyperlipidemia   HPI  Pt just started her lipitor last night after she finished the last of her crestor.  It was changed due to crestor no longer being available at [employer] urine pressure and lower abd pain.  Also c/o irreg menses.  Her periods are heavy and frequent.  She had 3 periods in November.  A fouth one started end of nov/beginning of December.   Pt has been trying to get her [employer] discount application for months.  She says she still hasn't been approved.  She needs to get in with gyn.   Her GERD is well controlled.   She is still smoking but is working to stop it.  Relevant past medical, surgical, family and social history reviewed and updated as indicated. Interim medical history since our last visit reviewed. Allergies and medications reviewed and updated.  Current outpatient prescriptions:  .  atorvastatin (LIPITOR) 20 MG tablet, Take 1 tablet (20 mg total) by mouth daily., Disp: 90 tablet, Rfl: 3 .  esomeprazole (NEXIUM) 40 MG capsule, Take 40 mg by mouth 2 (two) times daily. , Disp: , Rfl:  .  MAXALT 5 MG tablet, Take 5 mg by mouth as needed for migraine. , Disp: , Rfl: 1 .  propranolol (INDERAL) 80 MG tablet, Take 80 mg by mouth 2 (two) times daily., Disp: , Rfl:    Review of Systems  Constitutional: Negative for fever, chills, diaphoresis, appetite change, fatigue and unexpected weight change.  HENT: Positive for congestion and sneezing. Negative for dental problem, drooling, ear pain, facial swelling, hearing loss, mouth sores, sore throat, trouble swallowing and voice change.   Eyes: Negative for pain, discharge, redness, itching and visual disturbance.   Respiratory: Positive for cough. Negative for choking, shortness of breath and wheezing.   Cardiovascular: Negative for chest pain, palpitations and leg swelling.  Gastrointestinal: Negative for vomiting, abdominal pain, diarrhea, constipation and blood in stool.  Endocrine: Negative for cold intolerance, heat intolerance and polydipsia.  Genitourinary: Negative for dysuria, hematuria and decreased urine volume.  Musculoskeletal: Positive for back pain and arthralgias. Negative for gait problem.  Skin: Negative for rash.  Allergic/Immunologic: Negative for environmental allergies.  Neurological: Negative for seizures, syncope, light-headedness and headaches.  Hematological: Negative for adenopathy.  Psychiatric/Behavioral: Negative for suicidal ideas, dysphoric mood and agitation. The patient is not nervous/anxious.     Per HPI unless specifically indicated above     Objective:    BP 124/86 mmHg  Pulse 74  Temp(Src) 97.7 F (36.5 C)  Ht 5\' 5"  (1.651 m)  Wt 234 lb (106.142 kg)  BMI 38.94 kg/m2  SpO2 97%  Wt Readings from Last 3 Encounters:  02/21/15 234 lb (106.142 kg)  08/21/14 220 lb (99.791 kg)  04/27/14 200 lb (90.719 kg)    Physical Exam  Constitutional: She is oriented to person, place, and time. She appears well-developed and well-nourished.  HENT:  Head: Normocephalic and atraumatic.  Neck: Neck supple.  Cardiovascular: Normal rate and regular rhythm.   Pulmonary/Chest: Effort normal and breath sounds normal.  Abdominal: Soft. Bowel sounds are normal. She  exhibits no mass. There is no tenderness.  obese  Musculoskeletal: She exhibits no edema.  Lymphadenopathy:    She has no cervical adenopathy.  Neurological: She is alert and oriented to person, place, and time.  Skin: Skin is warm and dry.  Psychiatric: She has a normal mood and affect. Her behavior is normal.  Vitals reviewed.   Results for orders placed or performed in visit on 02/15/15  Comprehensive  metabolic panel  Result Value Ref Range   Sodium 138 135 - 146 mmol/L   Potassium 5.0 3.5 - 5.3 mmol/L   Chloride 103 98 - 110 mmol/L   CO2 25 20 - 31 mmol/L   Glucose, Bld 100 (H) 65 - 99 mg/dL   BUN 10 7 - 25 mg/dL   Creat 0.54 0.50 - 1.10 mg/dL   Total Bilirubin 0.4 0.2 - 1.2 mg/dL   Alkaline Phosphatase 68 33 - 115 U/L   AST 11 10 - 35 U/L   ALT 13 6 - 29 U/L   Total Protein 6.0 (L) 6.1 - 8.1 g/dL   Albumin 3.6 3.6 - 5.1 g/dL   Calcium 8.9 8.6 - 10.2 mg/dL  Lipid panel  Result Value Ref Range   Cholesterol 112 (L) 125 - 200 mg/dL   Triglycerides 142 <150 mg/dL   HDL 35 (L) >=46 mg/dL   Total CHOL/HDL Ratio 3.2 <=5.0 Ratio   VLDL 28 <30 mg/dL   LDL Cholesterol 49 <130 mg/dL      Urinalysis    Component Value Date/Time   COLORURINE YELLOW 08/21/2014 1250   APPEARANCEUR HAZY* 08/21/2014 1250   LABSPEC >1.030* 08/21/2014 1250   PHURINE 5.5 08/21/2014 1250   GLUCOSEU NEGATIVE 08/21/2014 1250   HGBUR SMALL* 08/21/2014 1250   BILIRUBINUR n 02/21/2015 1413   BILIRUBINUR NEGATIVE 08/21/2014 1250   KETONESUR TRACE* 08/21/2014 1250   PROTEINUR n 02/21/2015 1413   PROTEINUR TRACE* 08/21/2014 1250   UROBILINOGEN 1.0 02/21/2015 1413   UROBILINOGEN 0.2 08/21/2014 1250   NITRITE n 02/21/2015 1413   NITRITE NEGATIVE 08/21/2014 1250   LEUKOCYTESUR Negative 02/21/2015 1413      Assessment & Plan:   Encounter Diagnoses  Name Primary?  . Hyperlipidemia Yes  . Dysuria   . Gastroesophageal reflux disease, esophagitis presence not specified   . Cigarette nicotine dependence, uncomplicated   . Obesity, unspecified   . Excessive bleeding in premenopausal period     -reviewed labs with pt -cont current rx -In light of trying to get pt in with gyn for almost a year, will try to get pt in with gyn at [facility] if still unable to get her seen locally -counseled on smoking cessation -f/u OV 5mo. rto sooner prn

## 2015-03-22 ENCOUNTER — Ambulatory Visit (INDEPENDENT_AMBULATORY_CARE_PROVIDER_SITE_OTHER): Payer: Self-pay | Admitting: Women's Health

## 2015-03-22 ENCOUNTER — Encounter: Payer: Self-pay | Admitting: Women's Health

## 2015-03-22 VITALS — BP 130/82 | HR 76 | Wt 233.0 lb

## 2015-03-22 DIAGNOSIS — N939 Abnormal uterine and vaginal bleeding, unspecified: Secondary | ICD-10-CM | POA: Insufficient documentation

## 2015-03-22 DIAGNOSIS — R102 Pelvic and perineal pain: Secondary | ICD-10-CM | POA: Insufficient documentation

## 2015-03-22 DIAGNOSIS — Z8742 Personal history of other diseases of the female genital tract: Secondary | ICD-10-CM | POA: Insufficient documentation

## 2015-03-22 LAB — POCT WET PREP (WET MOUNT): CLUE CELLS WET PREP WHIFF POC: NEGATIVE

## 2015-03-22 NOTE — Progress Notes (Signed)
Patient ID: Lorraine Peters, female   DOB: July 01, 1970, 45 y.o.   MRN: [id number]   [facility] Visit  Patient name: Lorraine Peters MRN [id number]  Date of birth: [birth year]  CC & HPI:  Lorraine Peters is a 45 y.o. Caucasian female presenting today for report of intermittent lower pelvic pain and irregular vaginal bleeding for last few months she thinks beginning sometime in Sept. She had a normal pap w/in last year as well as pelvic u/s which revealed ovarian cyst at the [facility], states she was told pain/bleeding likely caused from rupturing of ovarian cysts. H/O BTL 17 years ago, has had regular periods since until recently. Will have her normal period around the normal time, but then also will have another 5-7d of bleeding per month, sometimes twice,  that is 'different and not her period'- more watery and associated w/ lower pelvic pain- she feels these may be ovarian cysts rupturing as this is what has happened in past. She is sexually active, although it has been a few months since last encounter d/t severe pain in uterus/ovaries like someone stabbing her right before orgasm. Normal bm's, denies constipation/diarrhea. Denies uti s/s or any problems voiding. Denies vaginal d/c, itching/odor/irritation.  Patient's last menstrual period was 03/17/2015. The current method of family planning is tubal ligation. [facility] pap [facility] pt  Pertinent History Reviewed:  Medical & Surgical Hx:   Past Medical History  Diagnosis Date  . Kidney stones   . Hypercholesterolemia   . Reflux   . Ovarian cyst    Past Surgical History  Procedure Laterality Date  . Tubal ligation     Medications: Reviewed & Updated - see associated section Social History: Reviewed -  reports that she has been smoking Cigarettes.  She has been smoking about 0.50 packs per day. She does not have any smokeless tobacco history on file.  Objective Findings:  Vitals: BP 130/82 mmHg  Pulse 76  Wt 233 lb  (105.688 kg)  LMP 03/17/2015 Body mass index is 38.77 kg/(m^2).  Physical Examination: General appearance - alert, well appearing, and in no distress Abdomen - soft, diffuse lower abd/pelvic tenderness w/ palpation, L>R Pelvic - external genitalia normal, cx clear, scant amt creamy white nonodorous d/c. -CMT. Diffuse tenderness w/ uterine/bilateral adnexae palpation L>R, unable to adequately assess uterus/adnexae d/t body habitus although no masses palpated  Results for orders placed or performed in visit on 03/22/15 (from the past 24 hour(s))  POCT Wet Prep Lenard Forth [place])   Collection Time: 03/22/15  3:51 PM  Result Value Ref Range   Source Wet Prep POC vaginal    WBC, Wet Prep HPF POC none    Bacteria Wet Prep HPF POC None None, Few, Too numerous to count   BACTERIA WET PREP MORPHOLOGY POC     Clue Cells Wet Prep HPF POC None None, Too numerous to count   Clue Cells Wet Prep Whiff POC Negative Whiff    Yeast Wet Prep HPF POC None    KOH Wet Prep POC     Trichomonas Wet Prep HPF POC none      Assessment & Plan:  A:   Pelvic pain  Irregular vaginal bleeding  H/O ovarian cyst    P:  GC/CT from urine today  Return for asap for pelvic u/s and f/u .  Tawnya Crook CNM, [facility] 03/22/2015 3:51 PM

## 2015-03-23 ENCOUNTER — Ambulatory Visit (INDEPENDENT_AMBULATORY_CARE_PROVIDER_SITE_OTHER): Payer: Self-pay

## 2015-03-23 DIAGNOSIS — N939 Abnormal uterine and vaginal bleeding, unspecified: Secondary | ICD-10-CM

## 2015-03-23 DIAGNOSIS — R102 Pelvic and perineal pain: Secondary | ICD-10-CM

## 2015-03-23 DIAGNOSIS — Z8742 Personal history of other diseases of the female genital tract: Secondary | ICD-10-CM

## 2015-03-23 LAB — GC/CHLAMYDIA PROBE AMP
CHLAMYDIA, DNA PROBE: NEGATIVE
Neisseria gonorrhoeae by PCR: NEGATIVE

## 2015-03-23 NOTE — Progress Notes (Signed)
PELVIC US TA/TV: homogenous anteverted uterus,normal ov's bilat (mobile),EEC 4.94mm,no free fluid seen,lt adnexal pain during ultrasound

## 2015-03-28 ENCOUNTER — Telehealth: Payer: Self-pay | Admitting: Women's Health

## 2015-03-28 MED ORDER — PIROXICAM 20 MG PO CAPS: 20.0000 mg | ORAL_CAPSULE | Freq: Every day | ORAL | Status: DC

## 2015-03-28 NOTE — Telephone Encounter (Signed)
Called pt, notified of normal pelvic u/s, no evidence of ovarian cysts, neg gc/ct. Discussed w/ LHE- will rx feldene 20mg  daily x 30d and f/u in 30d to see how pelvic pain/pain before orgasm is doing- he states it is likely prostaglandin mediated. Irregular periods likely perimenopausal, as she is 45yo and smokes. She doesn't know how old her mother was when she went through menopause.  Roma Schanz, CNM, [facility] 03/28/2015 12:37 PM

## 2015-05-01 ENCOUNTER — Encounter: Payer: Self-pay | Admitting: Women's Health

## 2015-05-01 ENCOUNTER — Ambulatory Visit (INDEPENDENT_AMBULATORY_CARE_PROVIDER_SITE_OTHER): Payer: Self-pay | Admitting: Women's Health

## 2015-05-01 VITALS — BP 126/78 | HR 76 | Wt 236.0 lb

## 2015-05-01 DIAGNOSIS — R102 Pelvic and perineal pain: Secondary | ICD-10-CM

## 2015-05-01 DIAGNOSIS — F5231 Female orgasmic disorder: Secondary | ICD-10-CM

## 2015-05-01 MED ORDER — PIROXICAM 20 MG PO CAPS: 20.0000 mg | ORAL_CAPSULE | Freq: Every day | ORAL | Status: DC | PRN

## 2015-05-01 NOTE — Progress Notes (Signed)
Patient ID: Lorraine Peters, female   DOB: [birth year], 45 y.o.   MRN: [id number]   [facility]  Patient name: Lorraine Peters MRN [id number]  Date of birth: 13-Feb-1971  CC & HPI:  ESM… FLAMMER is a 45 y.o.  Caucasian female presenting today for f/u after being placed on Feldene daily on 03/22/15 for report of pain w/ orgasm- had discussed w/ LHE at that visit and he felt likely prostaglandin related so was placed on Feldene. Had neg gc/ct, wet prep, and pelvic u/s. Pt states pain is much better- has had sex w/ orgasm and no pain. Out of rx. Discussed w/ LHE- ok to continue.   Patient's last menstrual period was 03/17/2015. The current method of family planning is tubal ligation. [facility], normal  Pertinent History Reviewed:  Medical & Surgical Hx:   Past Medical History  Diagnosis Date  . Kidney stones   . Hypercholesterolemia   . Reflux   . Ovarian cyst    Past Surgical History  Procedure Laterality Date  . Tubal ligation     Medications: Reviewed & Updated - see associated section Social History: Reviewed -  reports that she has been smoking Cigarettes.  She has been smoking about 0.50 packs per day. She does not have any smokeless tobacco history on file.  Objective Findings:  Vitals: BP 126/78 mmHg  Pulse 76  Wt 236 lb (107.049 kg)  LMP 03/17/2015 Body mass index is 39.27 kg/(m^2).  Physical Examination: General appearance - alert, well appearing, and in no distress  No results found for this or any previous visit (from the past 24 hour(s)).   Assessment & Plan:  A:   Med check-up  Painful orgasm, improving w/ Feldene  P:  Refilled Feldene to use daily prn #30 w/ 3RF  Return for prn.  Tawnya Crook CNM, [facility] 05/01/2015 12:05 PM

## 2015-05-22 ENCOUNTER — Ambulatory Visit: Payer: Self-pay | Admitting: Physician Assistant

## 2015-05-30 ENCOUNTER — Ambulatory Visit: Payer: Self-pay | Admitting: Physician Assistant

## 2015-05-30 ENCOUNTER — Encounter: Payer: Self-pay | Admitting: Physician Assistant

## 2015-05-30 VITALS — BP 100/76 | HR 76 | Temp 97.9°F | Ht 65.0 in | Wt 232.0 lb

## 2015-05-30 DIAGNOSIS — E669 Obesity, unspecified: Secondary | ICD-10-CM

## 2015-05-30 DIAGNOSIS — K219 Gastro-esophageal reflux disease without esophagitis: Secondary | ICD-10-CM

## 2015-05-30 DIAGNOSIS — F17210 Nicotine dependence, cigarettes, uncomplicated: Secondary | ICD-10-CM

## 2015-05-30 DIAGNOSIS — E785 Hyperlipidemia, unspecified: Secondary | ICD-10-CM

## 2015-05-30 NOTE — Progress Notes (Signed)
BP 100/76 mmHg  Pulse 76  Temp(Src) 97.9 F (36.6 C)  Ht 5\' 5"  (1.651 m)  Wt 232 lb (105.235 kg)  BMI 38.61 kg/m2  SpO2 96%  LMP 03/17/2015   Subjective:    Patient ID: Lorraine Peters, female    DOB: [birth year], 45 y.o.   MRN: [id number]  HPI: Lorraine Peters is a 45 y.o. female presenting on 05/30/2015 for Hyperlipidemia   HPI Pt is doing well.   States no new problems.   Says pelvic pain better on feldene (rx by gyn)  Relevant past medical, surgical, family and social history reviewed and updated as indicated. Interim medical history since our last visit reviewed. Allergies and medications reviewed and updated.  Current outpatient prescriptions:  .  atorvastatin (LIPITOR) 20 MG tablet, Take 1 tablet (20 mg total) by mouth daily., Disp: 90 tablet, Rfl: 3 .  esomeprazole (NEXIUM) 40 MG capsule, Take 40 mg by mouth 2 (two) times daily. , Disp: , Rfl:  .  MAXALT 5 MG tablet, Take 5 mg by mouth as needed for migraine. , Disp: , Rfl: 1 .  piroxicam (FELDENE) 20 MG capsule, Take 1 capsule (20 mg total) by mouth daily as needed., Disp: 30 capsule, Rfl: 3 .  propranolol (INDERAL) 80 MG tablet, Take 80 mg by mouth 2 (two) times daily., Disp: , Rfl:    Review of Systems  Constitutional: Negative for fever, chills, diaphoresis, appetite change, fatigue and unexpected weight change.  HENT: Negative for congestion, dental problem, drooling, ear pain, facial swelling, hearing loss, mouth sores, sneezing, sore throat, trouble swallowing and voice change.   Eyes: Positive for visual disturbance. Negative for pain, discharge, redness and itching.  Respiratory: Negative for cough, choking, shortness of breath and wheezing.   Cardiovascular: Negative for chest pain, palpitations and leg swelling.  Gastrointestinal: Negative for vomiting, abdominal pain, diarrhea, constipation and blood in stool.  Endocrine: Negative for cold intolerance, heat intolerance and polydipsia.  Genitourinary: Negative for  dysuria, hematuria and decreased urine volume.  Musculoskeletal: Positive for gait problem. Negative for back pain and arthralgias.  Skin: Negative for rash.  Allergic/Immunologic: Negative for environmental allergies.  Neurological: Positive for headaches. Negative for seizures, syncope and light-headedness.  Hematological: Negative for adenopathy.  Psychiatric/Behavioral: Negative for suicidal ideas, dysphoric mood and agitation. The patient is not nervous/anxious.     Per HPI unless specifically indicated above     Objective:    BP 100/76 mmHg  Pulse 76  Temp(Src) 97.9 F (36.6 C)  Ht 5\' 5"  (1.651 m)  Wt 232 lb (105.235 kg)  BMI 38.61 kg/m2  SpO2 96%  LMP 03/17/2015  Wt Readings from Last 3 Encounters:  05/30/15 232 lb (105.235 kg)  05/01/15 236 lb (107.049 kg)  03/22/15 233 lb (105.688 kg)    Physical Exam  Constitutional: She is oriented to person, place, and time. She appears well-developed and well-nourished.  HENT:  Head: Normocephalic and atraumatic.  Neck: Neck supple.  Cardiovascular: Normal rate and regular rhythm.   Pulmonary/Chest: Effort normal and breath sounds normal.  Abdominal: Soft. Bowel sounds are normal. She exhibits no mass. There is no hepatosplenomegaly. There is no tenderness.  Musculoskeletal: She exhibits no edema.  Lymphadenopathy:    She has no cervical adenopathy.  Neurological: She is alert and oriented to person, place, and time.  Skin: Skin is warm and dry.  Psychiatric: She has a normal mood and affect. Her behavior is normal.  Vitals reviewed.  Assessment & Plan:   Encounter Diagnoses  Name Primary?  . Hyperlipidemia Yes  . Gastroesophageal reflux disease, esophagitis presence not specified   . Cigarette nicotine dependence, uncomplicated   . Obesity, unspecified     -check fasting labs -counseled on smoking cessation -continue currrent medications -F/u 45months.  RTO sooner prn

## 2015-05-31 LAB — COMPLETE METABOLIC PANEL WITH GFR
ALT: 13 U/L (ref 6–29)
AST: 10 U/L (ref 10–35)
Albumin: 3.7 g/dL (ref 3.6–5.1)
Alkaline Phosphatase: 69 U/L (ref 33–115)
BILIRUBIN TOTAL: 0.4 mg/dL (ref 0.2–1.2)
BUN: 13 mg/dL (ref 7–25)
CALCIUM: 9.1 mg/dL (ref 8.6–10.2)
CO2: 27 mmol/L (ref 20–31)
CREATININE: 0.54 mg/dL (ref 0.50–1.10)
Chloride: 105 mmol/L (ref 98–110)
Glucose, Bld: 110 mg/dL — ABNORMAL HIGH (ref 65–99)
Potassium: 4.7 mmol/L (ref 3.5–5.3)
Sodium: 141 mmol/L (ref 135–146)
TOTAL PROTEIN: 6 g/dL — AB (ref 6.1–8.1)

## 2015-05-31 LAB — LIPID PANEL
CHOLESTEROL: 130 mg/dL (ref 125–200)
HDL: 33 mg/dL — AB (ref >=46)
LDL CALC: 72 mg/dL (ref <130)
TRIGLYCERIDES: 126 mg/dL (ref <150)
Total CHOL/HDL Ratio: 3.9 Ratio (ref <=5.0)
VLDL: 25 mg/dL (ref <30)

## 2015-06-26 ENCOUNTER — Other Ambulatory Visit: Payer: Self-pay | Admitting: Physician Assistant

## 2015-09-10 ENCOUNTER — Other Ambulatory Visit: Payer: Self-pay | Admitting: Physician Assistant

## 2015-09-13 ENCOUNTER — Other Ambulatory Visit: Payer: Self-pay | Admitting: Physician Assistant

## 2015-09-13 MED ORDER — PROPRANOLOL HCL 80 MG PO TABS: 80.0000 mg | ORAL_TABLET | Freq: Two times a day (BID) | ORAL | Status: DC

## 2015-09-24 ENCOUNTER — Other Ambulatory Visit: Payer: Self-pay | Admitting: Physician Assistant

## 2015-10-02 ENCOUNTER — Telehealth: Payer: Self-pay | Admitting: Student

## 2015-10-02 NOTE — Telephone Encounter (Signed)
-----   Message from Soyla Dryer, [place] sent at 09/27/2015 10:46 AM EDT ----- [facility] has axert.  It is in the same category as maxalt.  Ask if she would like to try that.  ----- Message -----    From: Kandice Robinsons, LPN    Sent: [id number]   3:21 PM      To: Soyla Dryer, PA-C  Called pt she says she never received the med and has requested it twice., so i called [facility] and they say the manufacturer no longer supplies Maxalt, so it is no longer available. Pt was suppose to come by to fill out application to receive propranolol, but has not done so. Pt was reminded to come by the office to do this. You have already given me prescriptions for propanolol. ----- Message -----    From: Soyla Dryer, PA-C    Sent: 09/24/2015   8:17 PM      To: Kandice Robinsons, LPN  Please call pt.  I got refill request for her to get more maxalt.  I just sent rx on 09/13/15 to [facility].  Please confirm that she got it (b/c if she did she shouldn't be needing more already). thanks

## 2015-10-04 ENCOUNTER — Other Ambulatory Visit: Payer: Self-pay | Admitting: Physician Assistant

## 2015-10-04 MED ORDER — ALMOTRIPTAN MALATE 12.5 MG PO TABS: ORAL_TABLET | ORAL | 0 refills | Status: DC

## 2015-10-04 NOTE — Telephone Encounter (Signed)
Pt okayed axert to be sent to [facility]. rx sent to [facility] 10-04-15

## 2015-10-16 ENCOUNTER — Ambulatory Visit: Payer: Self-pay | Admitting: Physician Assistant

## 2015-10-16 ENCOUNTER — Encounter: Payer: Self-pay | Admitting: Physician Assistant

## 2015-10-16 VITALS — BP 140/84 | HR 102 | Temp 97.5°F | Ht 65.0 in | Wt 230.0 lb

## 2015-10-16 DIAGNOSIS — L02211 Cutaneous abscess of abdominal wall: Secondary | ICD-10-CM

## 2015-10-16 DIAGNOSIS — J019 Acute sinusitis, unspecified: Secondary | ICD-10-CM

## 2015-10-16 MED ORDER — SULFAMETHOXAZOLE-TRIMETHOPRIM 800-160 MG PO TABS: 1.0000 | ORAL_TABLET | Freq: Two times a day (BID) | ORAL | 0 refills | Status: DC

## 2015-10-16 NOTE — Progress Notes (Signed)
BP 140/84   Pulse (!) 102   Temp 97.5 F (36.4 C)   Ht 5\' 5"  (1.651 m)   Wt 230 lb (104.3 kg)   LMP 10/11/2015   SpO2 95%   BMI 38.27 kg/m    Subjective:    Patient ID: Lorraine Peters, female    DOB: Aug 17, 1970, 45 y.o.   MRN: [id number]  HPI: Lorraine Peters is a 45 y.o. female presenting on 10/16/2015 for Sinusitis and Recurrent Skin Infections   HPI Pt states sinus headache began about 2 wk ago. She says it comes and goes. She is taking equate sinus medication that helps some.  Pt hurts temples and between eyes.  Pt is out of propranolol-  working on getting PAP for this med  Pt states she thinks she either has an ingrown hair or boil in pannus/pubis - today is day 5 that it has been bothering her.  Relevant past medical, surgical, family and social history reviewed and updated as indicated. Interim medical history since our last visit reviewed. Allergies and medications reviewed and updated.   Current Outpatient Prescriptions:  .  almotriptan (AXERT) 12.5 MG tablet, TAKE 1 TABLET BY MOUTH AT ONSET OF HEADACHE, MAY REPEAT IN 2 HOURS IF NO RELIEF. DO NOT EXCEED 2 TABLETS IN 24 HOURS., Disp: 12 tablet, Rfl: 0 .  atorvastatin (LIPITOR) 20 MG tablet, Take 1 tablet (20 mg total) by mouth daily., Disp: 90 tablet, Rfl: 3 .  omeprazole (PRILOSEC) 20 MG capsule, TAKE 2 Capsules BY MOUTH ONCE DAILY FOR GERD, Disp: 180 capsule, Rfl: 2 .  piroxicam (FELDENE) 20 MG capsule, Take 1 capsule (20 mg total) by mouth daily as needed., Disp: 30 capsule, Rfl: 3 .  propranolol (INDERAL) 80 MG tablet, Take 1 tablet (80 mg total) by mouth 2 (two) times daily. (Patient not taking: Reported on 10/16/2015), Disp: 180 tablet, Rfl: 3   Review of Systems  Constitutional: Negative for appetite change, chills, diaphoresis, fatigue, fever and unexpected weight change.  HENT: Positive for sneezing. Negative for congestion, drooling, ear pain, facial swelling, hearing loss, mouth sores, sore throat, trouble  swallowing and voice change.   Eyes: Negative for pain, discharge, redness, itching and visual disturbance.  Respiratory: Negative for cough, choking, shortness of breath and wheezing.   Cardiovascular: Negative for chest pain, palpitations and leg swelling.  Gastrointestinal: Negative for abdominal pain, blood in stool, constipation, diarrhea and vomiting.  Endocrine: Negative for cold intolerance, heat intolerance and polydipsia.  Genitourinary: Negative for decreased urine volume, dysuria and hematuria.  Musculoskeletal: Negative for arthralgias, back pain and gait problem.  Skin: Negative for rash.  Allergic/Immunologic: Negative for environmental allergies.  Neurological: Positive for headaches. Negative for seizures, syncope and light-headedness.  Hematological: Negative for adenopathy.  Psychiatric/Behavioral: Negative for agitation, dysphoric mood and suicidal ideas. The patient is not nervous/anxious.     Per HPI unless specifically indicated above     Objective:    BP 140/84   Pulse (!) 102   Temp 97.5 F (36.4 C)   Ht 5\' 5"  (1.651 m)   Wt 230 lb (104.3 kg)   LMP 10/11/2015   SpO2 95%   BMI 38.27 kg/m   Wt Readings from Last 3 Encounters:  10/16/15 230 lb (104.3 kg)  05/30/15 232 lb (105.2 kg)  05/01/15 236 lb (107 kg)    Physical Exam  Constitutional: She is oriented to person, place, and time. She appears well-developed and well-nourished.  HENT:  Head: Normocephalic and atraumatic.  Right Ear: Hearing, tympanic membrane, external ear and ear canal normal.  Left Ear: Hearing, tympanic membrane, external ear and ear canal normal.  Nose: Mucosal edema and rhinorrhea present. Right sinus exhibits maxillary sinus tenderness. Left sinus exhibits maxillary sinus tenderness.  Mouth/Throat: Uvula is midline and oropharynx is clear and moist. No oropharyngeal exudate.  Neck: Neck supple.  Cardiovascular: Normal rate and regular rhythm.   Pulmonary/Chest: Effort normal  and breath sounds normal. She has no wheezes.  Abdominal: Soft. Bowel sounds are normal. She exhibits no mass. There is no hepatosplenomegaly. There is no tenderness.  Musculoskeletal: She exhibits no edema.  Lymphadenopathy:    She has no cervical adenopathy.  Neurological: She is alert and oriented to person, place, and time.  Skin: Skin is warm and dry.     Abscess suprapubic area approx 41mm diameter.  Area cleaned with hibiclens. Instill 1% lidocaine. Opened with # 11 blade. Small pus expressed. Packed with 1/4 inch packing. Dry sterile dressing applied.  No bleeding. Pt tolerated procedure well and ambulated from room unassisted.  Psychiatric: She has a normal mood and affect. Her behavior is normal.  Vitals reviewed.       Assessment & Plan:   Encounter Diagnoses  Name Primary?  . Cutaneous abscess of abdominal wall Yes  . Acute sinusitis, recurrence not specified, unspecified location     -discussed I&D and pt agreed. She signed informed consent -rx septra which will take care of her abscess and sinuses -pt to remove packing tomorrow.  She is to use warm compresses and change dressing daily -f/u as scheduled.  RTO sooner prn

## 2015-11-15 ENCOUNTER — Other Ambulatory Visit: Payer: Self-pay | Admitting: Physician Assistant

## 2015-11-20 ENCOUNTER — Other Ambulatory Visit: Payer: Self-pay

## 2015-11-20 DIAGNOSIS — E785 Hyperlipidemia, unspecified: Secondary | ICD-10-CM

## 2015-11-28 ENCOUNTER — Ambulatory Visit: Payer: Self-pay | Admitting: Physician Assistant

## 2015-11-30 ENCOUNTER — Encounter: Payer: Self-pay | Admitting: Physician Assistant

## 2015-11-30 ENCOUNTER — Other Ambulatory Visit: Payer: Self-pay | Admitting: Physician Assistant

## 2015-11-30 ENCOUNTER — Ambulatory Visit: Payer: Self-pay | Admitting: Physician Assistant

## 2015-11-30 VITALS — BP 112/86 | HR 104 | Temp 97.7°F | Ht 65.0 in | Wt 228.5 lb

## 2015-11-30 DIAGNOSIS — Z1211 Encounter for screening for malignant neoplasm of colon: Secondary | ICD-10-CM

## 2015-11-30 DIAGNOSIS — K219 Gastro-esophageal reflux disease without esophagitis: Secondary | ICD-10-CM

## 2015-11-30 DIAGNOSIS — E785 Hyperlipidemia, unspecified: Secondary | ICD-10-CM

## 2015-11-30 DIAGNOSIS — Z1239 Encounter for other screening for malignant neoplasm of breast: Secondary | ICD-10-CM

## 2015-11-30 DIAGNOSIS — E669 Obesity, unspecified: Secondary | ICD-10-CM

## 2015-11-30 DIAGNOSIS — F17210 Nicotine dependence, cigarettes, uncomplicated: Secondary | ICD-10-CM

## 2015-11-30 LAB — HEMOGLOBIN A1C
Hgb A1c MFr Bld: 5.7 % — ABNORMAL HIGH (ref <5.7)
MEAN PLASMA GLUCOSE: 117 mg/dL

## 2015-11-30 LAB — COMPLETE METABOLIC PANEL WITHOUT GFR
ALT: 13 U/L (ref 6–29)
AST: 11 U/L (ref 10–35)
Albumin: 3.7 g/dL (ref 3.6–5.1)
Alkaline Phosphatase: 83 U/L (ref 33–115)
BUN: 13 mg/dL (ref 7–25)
CO2: 26 mmol/L (ref 20–31)
Calcium: 8.9 mg/dL (ref 8.6–10.2)
Chloride: 106 mmol/L (ref 98–110)
Creat: 0.44 mg/dL — ABNORMAL LOW (ref 0.50–1.10)
GFR, Est African American: >89 mL/min
GFR, Est Non African American: >89 mL/min
Glucose, Bld: 102 mg/dL — ABNORMAL HIGH (ref 65–99)
Potassium: 4.6 mmol/L (ref 3.5–5.3)
Sodium: 139 mmol/L (ref 135–146)
Total Bilirubin: 0.3 mg/dL (ref 0.2–1.2)
Total Protein: 5.9 g/dL — ABNORMAL LOW (ref 6.1–8.1)

## 2015-11-30 LAB — LIPID PANEL
Cholesterol: 147 mg/dL (ref 125–200)
HDL: 47 mg/dL (ref >=46)
LDL CALC: 76 mg/dL (ref <130)
Total CHOL/HDL Ratio: 3.1 Ratio (ref <=5.0)
Triglycerides: 119 mg/dL (ref <150)
VLDL: 24 mg/dL (ref <30)

## 2015-11-30 NOTE — Progress Notes (Signed)
BP 112/86 (BP Location: Left Arm, Patient Position: Sitting, Cuff Size: Normal)   Pulse (!) 104   Temp 97.7 F (36.5 C)   Ht 5\' 5"  (1.651 m)   Wt 228 lb 8 oz (103.6 kg)   SpO2 96%   BMI 38.02 kg/m    Subjective:    Patient ID: Lorraine Peters, female    DOB: 02-16-1971, 45 y.o.   MRN: [id number]  HPI: Lorraine Peters is a 45 y.o. female presenting on 11/30/2015 for Follow-up   HPI Pt feeling well today.  No complaints. States headaches doing okay since stopped the propranolol.   Relevant past medical, surgical, family and social history reviewed and updated as indicated. Interim medical history since our last visit reviewed. Allergies and medications reviewed and updated.   Current Outpatient Prescriptions:  .  almotriptan (AXERT) 12.5 MG tablet, TAKE 1 TABLET BY MOUTH AT ONSET OF HEADACHE, MAY REPEAT IN 2 HOURS IF NO RELIEF. DO NOT EXCEED 2 TABLETS IN 24 HOURS., Disp: 12 tablet, Rfl: 0 .  atorvastatin (LIPITOR) 20 MG tablet, TAKE 1 Tablet BY MOUTH ONCE DAILY, Disp: 90 tablet, Rfl: 2 .  omeprazole (PRILOSEC) 20 MG capsule, TAKE 2 Capsules BY MOUTH ONCE DAILY FOR GERD, Disp: 180 capsule, Rfl: 2 .  piroxicam (FELDENE) 20 MG capsule, Take 1 capsule (20 mg total) by mouth daily as needed., Disp: 30 capsule, Rfl: 3   Review of Systems  Constitutional: Negative for appetite change, chills, diaphoresis, fatigue, fever and unexpected weight change.  HENT: Negative for congestion, dental problem, drooling, ear pain, facial swelling, hearing loss, mouth sores, sneezing, sore throat, trouble swallowing and voice change.   Eyes: Negative for pain, discharge, redness, itching and visual disturbance.  Respiratory: Negative for cough, choking, shortness of breath and wheezing.   Cardiovascular: Positive for leg swelling. Negative for chest pain and palpitations.  Gastrointestinal: Negative for abdominal pain, blood in stool, constipation, diarrhea and vomiting.  Endocrine: Negative for cold  intolerance, heat intolerance and polydipsia.  Genitourinary: Negative for decreased urine volume, dysuria and hematuria.  Musculoskeletal: Negative for arthralgias, back pain and gait problem.  Skin: Negative for rash.  Allergic/Immunologic: Negative for environmental allergies.  Neurological: Positive for headaches. Negative for seizures, syncope and light-headedness.  Hematological: Negative for adenopathy.  Psychiatric/Behavioral: Negative for agitation, dysphoric mood and suicidal ideas. The patient is not nervous/anxious.     Per HPI unless specifically indicated above     Objective:    BP 112/86 (BP Location: Left Arm, Patient Position: Sitting, Cuff Size: Normal)   Pulse (!) 104   Temp 97.7 F (36.5 C)   Ht 5\' 5"  (1.651 m)   Wt 228 lb 8 oz (103.6 kg)   SpO2 96%   BMI 38.02 kg/m   Wt Readings from Last 3 Encounters:  11/30/15 228 lb 8 oz (103.6 kg)  10/16/15 230 lb (104.3 kg)  05/30/15 232 lb (105.2 kg)    Physical Exam  Constitutional: She is oriented to person, place, and time. She appears well-developed and well-nourished.  HENT:  Head: Normocephalic and atraumatic.  Neck: Neck supple.  Cardiovascular: Normal rate and regular rhythm.   Pulmonary/Chest: Effort normal and breath sounds normal.  Abdominal: Soft. Bowel sounds are normal. She exhibits no mass. There is no hepatosplenomegaly. There is no tenderness.  Musculoskeletal: She exhibits no edema.  Lymphadenopathy:    She has no cervical adenopathy.  Neurological: She is alert and oriented to person, place, and time.  Skin: Skin is  warm and dry.  Psychiatric: She has a normal mood and affect. Her behavior is normal.  Vitals reviewed.   Results for orders placed or performed in visit on 11/20/15  HgB A1c  Result Value Ref Range   Hgb A1c MFr Bld 5.7 (H) <5.7 %   Mean Plasma Glucose 117 mg/dL  COMPLETE METABOLIC PANEL WITH GFR  Result Value Ref Range   Sodium 139 135 - 146 mmol/L   Potassium 4.6 3.5 -  5.3 mmol/L   Chloride 106 98 - 110 mmol/L   CO2 26 20 - 31 mmol/L   Glucose, Bld 102 (H) 65 - 99 mg/dL   BUN 13 7 - 25 mg/dL   Creat 0.44 (L) 0.50 - 1.10 mg/dL   Total Bilirubin 0.3 0.2 - 1.2 mg/dL   Alkaline Phosphatase 83 33 - 115 U/L   AST 11 10 - 35 U/L   ALT 13 6 - 29 U/L   Total Protein 5.9 (L) 6.1 - 8.1 g/dL   Albumin 3.7 3.6 - 5.1 g/dL   Calcium 8.9 8.6 - 10.2 mg/dL   GFR, Est African American >89 >=60 mL/min   GFR, Est Non African American >89 >=60 mL/min  Lipid Profile  Result Value Ref Range   Cholesterol 147 125 - 200 mg/dL   Triglycerides 119 <150 mg/dL   HDL 47 >=46 mg/dL   Total CHOL/HDL Ratio 3.1 <=5.0 Ratio   VLDL 24 <30 mg/dL   LDL Cholesterol 76 <130 mg/dL      Assessment & Plan:   Encounter Diagnoses  Name Primary?  . Hyperlipidemia Yes  . Gastroesophageal reflux disease, esophagitis presence not specified   . Cigarette nicotine dependence, uncomplicated   . Obesity, unspecified   . Special screening for malignant neoplasms, colon   . Screening for breast cancer     -reviewed labs with pt -order screening mammogram -Gave iFOBT for colon cancer screeing -counseled smoking cessation -F/u 6  Months.  RTO sooner prn

## 2015-12-04 LAB — IFOBT (OCCULT BLOOD): IFOBT: NEGATIVE

## 2015-12-11 ENCOUNTER — Ambulatory Visit: Payer: Self-pay

## 2015-12-13 ENCOUNTER — Ambulatory Visit (HOSPITAL_COMMUNITY): Payer: Self-pay

## 2015-12-15 ENCOUNTER — Ambulatory Visit (HOSPITAL_COMMUNITY)
Admission: RE | Admit: 2015-12-15 | Discharge: 2015-12-15 | Disposition: A | Discharge status: Home / Self Care | Payer: Self-pay | Source: Ambulatory Visit | Attending: Physician Assistant | Admitting: Physician Assistant

## 2016-02-22 ENCOUNTER — Other Ambulatory Visit: Payer: Self-pay | Admitting: Physician Assistant

## 2016-02-22 MED ORDER — ALMOTRIPTAN MALATE 12.5 MG PO TABS: ORAL_TABLET | ORAL | 1 refills | Status: DC

## 2016-03-07 ENCOUNTER — Ambulatory Visit: Payer: Self-pay | Admitting: Physician Assistant

## 2016-03-07 ENCOUNTER — Encounter: Payer: Self-pay | Admitting: Physician Assistant

## 2016-03-07 VITALS — BP 134/86 | HR 98 | Temp 98.1°F | Ht 65.0 in | Wt 233.0 lb

## 2016-03-07 DIAGNOSIS — J019 Acute sinusitis, unspecified: Secondary | ICD-10-CM

## 2016-03-07 DIAGNOSIS — F17210 Nicotine dependence, cigarettes, uncomplicated: Secondary | ICD-10-CM

## 2016-03-07 MED ORDER — AMOXICILLIN 500 MG PO CAPS: 500.0000 mg | ORAL_CAPSULE | Freq: Three times a day (TID) | ORAL | 0 refills | Status: DC

## 2016-03-07 NOTE — Progress Notes (Signed)
BP 134/86 (BP Location: Left Arm, Patient Position: Sitting, Cuff Size: Large)   Pulse 98   Temp 98.1 F (36.7 C) (Other (Comment))   Ht 5\' 5"  (1.651 m)   Wt 233 lb (105.7 kg)   LMP 02/20/2016 (Approximate)   SpO2 98%   BMI 38.77 kg/m    Subjective:    Patient ID: Lorraine Peters, female    DOB: Feb 15, 1971, 45 y.o.   MRN: [id number]  HPI: Lorraine Peters is a 45 y.o. female presenting on 03/07/2016 for Sinus Problem (has had congestion and headache for 3 weeks, otc's have not helped)   HPI   Chief Complaint  Patient presents with  . Sinus Problem    has had congestion and headache for 3 weeks, otc's have not helped   Pt is still smoking  Relevant past medical, surgical, family and social history reviewed and updated as indicated. Interim medical history since our last visit reviewed. Allergies and medications reviewed and updated.  CURRENT MEDS: axert lipitor prilosec feldene  Review of Systems  Constitutional: Positive for fatigue. Negative for appetite change, chills, diaphoresis, fever and unexpected weight change.  HENT: Positive for congestion, ear pain and sneezing. Negative for dental problem, drooling, facial swelling, hearing loss, mouth sores, sore throat, trouble swallowing and voice change.   Eyes: Negative for pain, discharge, redness, itching and visual disturbance.  Respiratory: Negative for cough, choking, shortness of breath and wheezing.   Cardiovascular: Negative for chest pain, palpitations and leg swelling.  Gastrointestinal: Negative for abdominal pain, blood in stool, constipation, diarrhea and vomiting.  Endocrine: Positive for polydipsia. Negative for cold intolerance and heat intolerance.  Genitourinary: Negative for decreased urine volume, dysuria and hematuria.  Musculoskeletal: Negative for arthralgias, back pain and gait problem.  Skin: Negative for rash.  Allergic/Immunologic: Negative for environmental allergies.  Neurological: Positive for  headaches. Negative for seizures, syncope and light-headedness.  Hematological: Negative for adenopathy.  Psychiatric/Behavioral: Negative for agitation, dysphoric mood and suicidal ideas. The patient is not nervous/anxious.     Per HPI unless specifically indicated above     Objective:    BP 134/86 (BP Location: Left Arm, Patient Position: Sitting, Cuff Size: Large)   Pulse 98   Temp 98.1 F (36.7 C) (Other (Comment))   Ht 5\' 5"  (1.651 m)   Wt 233 lb (105.7 kg)   LMP 02/20/2016 (Approximate)   SpO2 98%   BMI 38.77 kg/m   Wt Readings from Last 3 Encounters:  03/07/16 233 lb (105.7 kg)  11/30/15 228 lb 8 oz (103.6 kg)  10/16/15 230 lb (104.3 kg)    Physical Exam  Constitutional: She is oriented to person, place, and time. She appears well-developed and well-nourished.  HENT:  Head: Normocephalic and atraumatic.  Right Ear: Hearing, tympanic membrane, external ear and ear canal normal.  Left Ear: Hearing, tympanic membrane, external ear and ear canal normal.  Nose: Mucosal edema and rhinorrhea present.  Mouth/Throat: Uvula is midline and oropharynx is clear and moist. No oropharyngeal exudate.  Neck: Neck supple.  Cardiovascular: Normal rate and regular rhythm.   Pulmonary/Chest: Effort normal and breath sounds normal. She has no wheezes.  Lymphadenopathy:    She has no cervical adenopathy.  Neurological: She is alert and oriented to person, place, and time.  Skin: Skin is warm and dry.  Psychiatric: She has a normal mood and affect. Her behavior is normal.  Vitals reviewed.       Assessment & Plan:   Encounter Diagnoses  Name Primary?  . Acute sinusitis, recurrence not specified, unspecified location Yes  . Cigarette nicotine dependence, uncomplicated      -rx amoxil -OTCs prn -Counseled pt on avoidance of smoking to improve resolution of symptoms -follow up as scheduled.  RTO sooner prn

## 2016-03-07 NOTE — Patient Instructions (Signed)

## 2016-03-20 ENCOUNTER — Other Ambulatory Visit: Payer: Self-pay | Admitting: Physician Assistant

## 2016-05-20 ENCOUNTER — Other Ambulatory Visit: Payer: Self-pay

## 2016-05-20 DIAGNOSIS — E785 Hyperlipidemia, unspecified: Secondary | ICD-10-CM

## 2016-05-22 LAB — COMPREHENSIVE METABOLIC PANEL
ALBUMIN: 3.8 g/dL (ref 3.6–5.1)
ALK PHOS: 78 U/L (ref 33–115)
ALT: 13 U/L (ref 6–29)
AST: 12 U/L (ref 10–35)
BILIRUBIN TOTAL: 0.4 mg/dL (ref 0.2–1.2)
BUN: 15 mg/dL (ref 7–25)
CALCIUM: 9.1 mg/dL (ref 8.6–10.2)
CO2: 28 mmol/L (ref 20–31)
Chloride: 106 mmol/L (ref 98–110)
Creat: 0.54 mg/dL (ref 0.50–1.10)
Glucose, Bld: 114 mg/dL — ABNORMAL HIGH (ref 65–99)
Potassium: 4.2 mmol/L (ref 3.5–5.3)
Sodium: 140 mmol/L (ref 135–146)
Total Protein: 6.2 g/dL (ref 6.1–8.1)

## 2016-05-22 LAB — LIPID PANEL
Cholesterol: 127 mg/dL (ref <200)
HDL: 33 mg/dL — AB (ref >50)
LDL Cholesterol: 72 mg/dL (ref <100)
Total CHOL/HDL Ratio: 3.8 Ratio (ref <5.0)
Triglycerides: 109 mg/dL (ref <150)
VLDL: 22 mg/dL (ref <30)

## 2016-05-24 ENCOUNTER — Emergency Department (HOSPITAL_COMMUNITY): Payer: Self-pay

## 2016-05-24 ENCOUNTER — Encounter (HOSPITAL_COMMUNITY): Payer: Self-pay | Admitting: *Deleted

## 2016-05-24 ENCOUNTER — Emergency Department (HOSPITAL_COMMUNITY)
Admission: EM | Admit: 2016-05-24 | Discharge: 2016-05-24 | Disposition: A | Discharge status: Home / Self Care | Payer: Self-pay | Attending: Emergency Medicine | Admitting: Emergency Medicine

## 2016-05-24 DIAGNOSIS — Z79899 Other long term (current) drug therapy: Secondary | ICD-10-CM | POA: Insufficient documentation

## 2016-05-24 DIAGNOSIS — F17210 Nicotine dependence, cigarettes, uncomplicated: Secondary | ICD-10-CM | POA: Insufficient documentation

## 2016-05-24 DIAGNOSIS — J328 Other chronic sinusitis: Secondary | ICD-10-CM | POA: Insufficient documentation

## 2016-05-24 DIAGNOSIS — J329 Chronic sinusitis, unspecified: Secondary | ICD-10-CM

## 2016-05-24 MED ORDER — DEXAMETHASONE 4 MG PO TABS: 4.0000 mg | ORAL_TABLET | Freq: Two times a day (BID) | ORAL | 0 refills | Status: DC

## 2016-05-24 MED ORDER — HYDROCODONE-HOMATROPINE 5-1.5 MG/5ML PO SYRP: 5.0000 mL | ORAL_SOLUTION | Freq: Four times a day (QID) | ORAL | 0 refills | Status: DC | PRN

## 2016-05-24 MED ORDER — LORATADINE-PSEUDOEPHEDRINE ER 5-120 MG PO TB12
1.0000 | ORAL_TABLET | Freq: Two times a day (BID) | ORAL | 0 refills | Status: DC
Start: 2016-05-24 — End: 2017-03-15

## 2016-05-24 NOTE — ED Triage Notes (Signed)
Pt comes in with productive cough starting last Sunday. Pt has bilateral rib pain from coughing. Denies any n/v/d.

## 2016-05-24 NOTE — Discharge Instructions (Signed)
Your vital signs within normal limits. Your chest x-ray is negative for pneumonia or any other acute problem. Your oxygen level is 97% on room air.Please use Claritin-D every 12 hours, please increase water, juices, Gatorade, etc. Please wash hands frequently.Use Decadron 2 daily with food. Use Hycodan for cough. This medication cause drowsiness, please use with caution.

## 2016-05-24 NOTE — ED Provider Notes (Signed)
[facility] DEPT Provider Note   CSN: [id number] Arrival date & time: 05/24/16  0808     History   Chief Complaint Chief Complaint  Patient presents with  . Cough    HPI Lorraine Peters is a 46 y.o. female.  Patient is a 46 year old female who presents to the emergency department with a complaint of cough congestion and generally not feeling well.  Patient states she has been sick since Sunday, March 11. She's been having problems with some nasal congestion, sinus pressure, cough, body aches. She states she has done so much coughing, that now her ribs are sore and it hurts to deep breathes or laugh or cough. She is drinking, but eating less than usual. She has not recorded any temperature elevation. She does not recall significant chills. She presents to the emergency department for evaluation.   The history is provided by the patient.  Cough  Associated symptoms include myalgias. Pertinent negatives include no wheezing.    Past Medical History:  Diagnosis Date  . Hypercholesterolemia   . Kidney stones   . Ovarian cyst   . Reflux     Patient Active Problem List   Diagnosis Date Noted  . Pelvic pain in female 03/22/2015  . History of ovarian cyst 03/22/2015  . Abnormal vaginal bleeding 03/22/2015  . Hyperlipidemia 02/21/2015  . Esophageal reflux 02/21/2015  . Cigarette nicotine dependence, uncomplicated 57/84/6962  . Obesity, unspecified 02/21/2015  . Excessive bleeding in premenopausal period 02/21/2015    Past Surgical History:  Procedure Laterality Date  . TUBAL LIGATION      OB History    Gravida Para Term Preterm AB Living             1   SAB TAB Ectopic Multiple Live Births                   Home Medications    Prior to Admission medications   Medication Sig Start Date End Date Taking? Authorizing Provider  almotriptan (AXERT) 12.5 MG tablet TAKE 1 TABLET BY MOUTH AT ONSET OF HEADACHE, MAY REPEAT IN 2 HOURS IF NO RELIEF. DO NOT EXCEED 2 TABLETS  IN 24 HOURS. 02/22/16   Soyla Dryer, PA-C  amoxicillin (AMOXIL) 500 MG capsule Take 1 capsule (500 mg total) by mouth 3 (three) times daily. 03/07/16   Soyla Dryer, PA-C  atorvastatin (LIPITOR) 20 MG tablet TAKE 1 Tablet BY MOUTH ONCE DAILY 11/15/15   Soyla Dryer, PA-C  omeprazole (PRILOSEC) 20 MG capsule TAKE 2 Capsules BY MOUTH ONCE DAILY FOR GERD 03/20/16   Soyla Dryer, PA-C  piroxicam (FELDENE) 20 MG capsule Take 1 capsule (20 mg total) by mouth daily as needed. 05/01/15   Roma Schanz, CNM    Family History Family History  Problem Relation Age of Onset  . Arthritis Mother   . Cancer Mother     breast  . COPD Father   . Heart disease Father   . Hyperlipidemia Father   . Hypertension Father   . Cancer Sister     anal  . Stroke Maternal Grandfather   . Cancer Maternal Aunt     breast  . Cancer Maternal Uncle     breast    Social History Social History  Substance Use Topics  . Smoking status: Current Every Day Smoker    Packs/day: 0.50    Years: 28.00    Types: Cigarettes  . Smokeless tobacco: Never Used  . Alcohol use No  Allergies   Bee venom   Review of Systems Review of Systems  Constitutional: Positive for fatigue. Negative for fever.  HENT: Positive for congestion, postnasal drip and sinus pressure. Negative for voice change.   Respiratory: Positive for cough. Negative for wheezing and stridor.   Musculoskeletal: Positive for myalgias.  All other systems reviewed and are negative.    Physical Exam Updated Vital Signs BP (!) 143/82 (BP Location: Left Arm)   Pulse 86   Temp 97.7 F (36.5 C) (Oral)   Resp 18   Ht 5\' 7"  (1.702 m)   Wt 94.3 kg   LMP 05/16/2016   SpO2 97%   BMI 32.58 kg/m   Physical Exam  Constitutional: She is oriented to person, place, and time. She appears well-developed and well-nourished.  Non-toxic appearance.  HENT:  Head: Normocephalic.  Right Ear: Tympanic membrane and external ear normal.  Left Ear:  Tympanic membrane and external ear normal.  Oral pharynx is clear. Uvula midline. Airway patent. Nasal congestion present. Swollen turbinates.  Eyes: EOM and lids are normal. Pupils are equal, round, and reactive to light.  Neck: Normal range of motion. Neck supple. Carotid bruit is not present.  Cardiovascular: Normal rate, regular rhythm, normal heart sounds, intact distal pulses and normal pulses.   Pulmonary/Chest: No respiratory distress. She has no wheezes. She has no rales.  Pt speaks in complete sentences. Cough during exam and interview.  Abdominal: Soft. Bowel sounds are normal. There is no tenderness. There is no guarding.  Musculoskeletal: Normal range of motion.  Lymphadenopathy:       Head (right side): No submandibular adenopathy present.       Head (left side): No submandibular adenopathy present.    She has no cervical adenopathy.  Neurological: She is alert and oriented to person, place, and time. She has normal strength. No cranial nerve deficit or sensory deficit.  Skin: Skin is warm and dry. No rash noted.  Psychiatric: She has a normal mood and affect. Her speech is normal.  Nursing note and vitals reviewed.    ED Treatments / Results  Labs (all labs ordered are listed, but only abnormal results are displayed) Labs Reviewed - No data to display  EKG  EKG Interpretation None       Radiology No results found.  Procedures Procedures (including critical care time)  Medications Ordered in ED Medications - No data to display   Initial Impression / Assessment and Plan / ED Course  I have reviewed the triage vital signs and the nursing notes.  Pertinent labs & imaging results that were available during my care of the patient were reviewed by me and considered in my medical decision making (see chart for details).     *I have reviewed nursing notes, vital signs, and all appropriate lab and imaging results for this patient.**  Final Clinical  Impressions(s) / ED Diagnoses MDM  Vital signs within normal limits. Pulse oximetry is 97% on room air. Chest x-ray is negative for acute problem. The examination suggests bronchitis and upr respiratory infection.  Prescription for Decadron, Hycodan, and Claritin-D given. Pt also given albuterol inhaler to use every 4 hours as needed.   Final diagnoses:  Other sinusitis, unspecified chronicity    New Prescriptions Discharge Medication List as of 05/24/2016 11:10 AM    START taking these medications   Details  dexamethasone (DECADRON) 4 MG tablet Take 1 tablet (4 mg total) by mouth 2 (two) times daily with a meal., Starting Fri  05/24/2016, Print    HYDROcodone-homatropine (HYCODAN) 5-1.5 MG/5ML syrup Take 5 mLs by mouth every 6 (six) hours as needed., Starting Fri 05/24/2016, Print    loratadine-pseudoephedrine (CLARITIN-D 12 HOUR) 5-120 MG tablet Take 1 tablet by mouth 2 (two) times daily., Starting Fri 05/24/2016, Print         Lily Kocher, PA-C 05/24/16 [place], PA-C 05/24/16 [place], DO 05/26/16 1606

## 2016-05-30 ENCOUNTER — Ambulatory Visit: Payer: Self-pay | Admitting: Physician Assistant

## 2016-05-30 ENCOUNTER — Encounter: Payer: Self-pay | Admitting: Physician Assistant

## 2016-05-30 VITALS — BP 130/68 | HR 103 | Ht 65.0 in | Wt 223.0 lb

## 2016-05-30 DIAGNOSIS — E785 Hyperlipidemia, unspecified: Secondary | ICD-10-CM

## 2016-05-30 DIAGNOSIS — F17219 Nicotine dependence, cigarettes, with unspecified nicotine-induced disorders: Secondary | ICD-10-CM

## 2016-05-30 DIAGNOSIS — K219 Gastro-esophageal reflux disease without esophagitis: Secondary | ICD-10-CM

## 2016-05-30 DIAGNOSIS — J40 Bronchitis, not specified as acute or chronic: Secondary | ICD-10-CM

## 2016-05-30 MED ORDER — AZITHROMYCIN 250 MG PO TABS: ORAL_TABLET | ORAL | 0 refills | Status: AC

## 2016-05-30 MED ORDER — ALBUTEROL SULFATE (2.5 MG/3ML) 0.083% IN NEBU
2.5000 mg | INHALATION_SOLUTION | Freq: Once | RESPIRATORY_TRACT | Status: AC
  Administered 2016-05-30: 2.5 mg via RESPIRATORY_TRACT

## 2016-05-30 MED ORDER — ALBUTEROL SULFATE HFA 108 (90 BASE) MCG/ACT IN AERS: 2.0000 | INHALATION_SPRAY | Freq: Four times a day (QID) | RESPIRATORY_TRACT | 1 refills | Status: DC | PRN

## 2016-05-30 MED ORDER — BENZONATATE 100 MG PO CAPS: ORAL_CAPSULE | ORAL | 3 refills | Status: DC

## 2016-05-30 NOTE — Progress Notes (Signed)
BP 130/68 (BP Location: Left Arm, Patient Position: Sitting, Cuff Size: Normal)   Pulse (!) 103   Ht 5\' 5"  (1.651 m)   Wt 223 lb (101.2 kg)   LMP 05/16/2016   SpO2 96%   BMI 37.11 kg/m    Subjective:    Patient ID: [name], female    DOB: [birth year], 46 y.o.   MRN: [id number]  HPI: BRUCE MAYERS is a 46 y.o. female presenting on 05/30/2016 for Hyperlipidemia   HPI  Pt states not better from when seen in ER last Friday.  She  Was given decadron, hycodan and claritan-D.  She finished the decadron this morning.  .   Pt still smoking (not since being sick but was prior to 3/16)  Relevant past medical, surgical, family and social history reviewed and updated as indicated. Interim medical history since our last visit reviewed. Allergies and medications reviewed and updated.   Current Outpatient Prescriptions:  .  almotriptan (AXERT) 12.5 MG tablet, TAKE 1 TABLET BY MOUTH AT ONSET OF HEADACHE, MAY REPEAT IN 2 HOURS IF NO RELIEF. DO NOT EXCEED 2 TABLETS IN 24 HOURS., Disp: 12 tablet, Rfl: 1 .  atorvastatin (LIPITOR) 20 MG tablet, TAKE 1 Tablet BY MOUTH ONCE DAILY, Disp: 90 tablet, Rfl: 2 .  dexamethasone (DECADRON) 4 MG tablet, Take 1 tablet (4 mg total) by mouth 2 (two) times daily with a meal., Disp: 12 tablet, Rfl: 0 .  HYDROcodone-homatropine (HYCODAN) 5-1.5 MG/5ML syrup, Take 5 mLs by mouth every 6 (six) hours as needed., Disp: 120 mL, Rfl: 0 .  loratadine-pseudoephedrine (CLARITIN-D 12 HOUR) 5-120 MG tablet, Take 1 tablet by mouth 2 (two) times daily., Disp: 20 tablet, Rfl: 0 .  omeprazole (PRILOSEC) 20 MG capsule, TAKE 2 Capsules BY MOUTH ONCE DAILY FOR GERD, Disp: 180 capsule, Rfl: 3 .  piroxicam (FELDENE) 20 MG capsule, Take 1 capsule (20 mg total) by mouth daily as needed., Disp: 30 capsule, Rfl: 3   Review of Systems  Constitutional: Positive for diaphoresis and fatigue. Negative for appetite change, chills, fever and unexpected weight change.  HENT: Positive for  congestion. Negative for dental problem, drooling, ear pain, facial swelling, hearing loss, mouth sores, sneezing, sore throat, trouble swallowing and voice change.   Eyes: Negative for pain, discharge, redness, itching and visual disturbance.  Respiratory: Positive for cough, shortness of breath and wheezing. Negative for choking.   Cardiovascular: Negative for chest pain, palpitations and leg swelling.  Gastrointestinal: Negative for abdominal pain, blood in stool, constipation, diarrhea and vomiting.  Endocrine: Negative for cold intolerance, heat intolerance and polydipsia.  Genitourinary: Negative for decreased urine volume, dysuria and hematuria.  Musculoskeletal: Negative for arthralgias, back pain and gait problem.  Skin: Negative for rash.  Allergic/Immunologic: Negative for environmental allergies.  Neurological: Negative for seizures, syncope, light-headedness and headaches.  Hematological: Negative for adenopathy.  Psychiatric/Behavioral: Negative for agitation, dysphoric mood and suicidal ideas. The patient is not nervous/anxious.     Per HPI unless specifically indicated above     Objective:    BP 130/68 (BP Location: Left Arm, Patient Position: Sitting, Cuff Size: Normal)   Pulse (!) 103   Ht 5\' 5"  (1.651 m)   Wt 223 lb (101.2 kg)   LMP 05/16/2016   SpO2 96%   BMI 37.11 kg/m   Wt Readings from Last 3 Encounters:  05/30/16 223 lb (101.2 kg)  05/24/16 208 lb (94.3 kg)  03/07/16 233 lb (105.7 kg)    Physical Exam  Constitutional: She is oriented to person, place, and time. She appears well-developed and well-nourished.  HENT:  Head: Normocephalic and atraumatic.  Right Ear: Hearing, tympanic membrane, external ear and ear canal normal.  Left Ear: Hearing, tympanic membrane, external ear and ear canal normal.  Nose: Nose normal.  Mouth/Throat: Uvula is midline and oropharynx is clear and moist. No oropharyngeal exudate.  Neck: Neck supple.  Cardiovascular: Normal  rate and regular rhythm.   Pulmonary/Chest: Effort normal. No respiratory distress. She has wheezes. She has no rales.  Abdominal: Soft. Bowel sounds are normal. She exhibits no mass. There is no hepatosplenomegaly. There is no tenderness.  Musculoskeletal: She exhibits no edema.  Lymphadenopathy:    She has no cervical adenopathy.  Neurological: She is alert and oriented to person, place, and time.  Skin: Skin is warm and dry.  Psychiatric: She has a normal mood and affect. Her behavior is normal.  Vitals reviewed.   Results for orders placed or performed in visit on 05/20/16  Comprehensive Metabolic Panel (CMET)  Result Value Ref Range   Sodium 140 135 - 146 mmol/L   Potassium 4.2 3.5 - 5.3 mmol/L   Chloride 106 98 - 110 mmol/L   CO2 28 20 - 31 mmol/L   Glucose, Bld 114 (H) 65 - 99 mg/dL   BUN 15 7 - 25 mg/dL   Creat 0.54 0.50 - 1.10 mg/dL   Total Bilirubin 0.4 0.2 - 1.2 mg/dL   Alkaline Phosphatase 78 33 - 115 U/L   AST 12 10 - 35 U/L   ALT 13 6 - 29 U/L   Total Protein 6.2 6.1 - 8.1 g/dL   Albumin 3.8 3.6 - 5.1 g/dL   Calcium 9.1 8.6 - 10.2 mg/dL  Lipid Profile  Result Value Ref Range   Cholesterol 127 <200 mg/dL   Triglycerides 109 <150 mg/dL   HDL 33 (L) >50 mg/dL   Total CHOL/HDL Ratio 3.8 <5.0 Ratio   VLDL 22 <30 mg/dL   LDL Cholesterol 72 <100 mg/dL      Assessment & Plan:    Encounter Diagnoses  Name Primary?  . Bronchitis Yes  . Cigarette nicotine dependence with nicotine-induced disorder   . Hyperlipidemia, unspecified hyperlipidemia type   . Gastroesophageal reflux disease, esophagitis presence not specified     -reviewed labs with pt -pt to continue usual medications -pt given nebulizer treatment in office -counseled smoking cessation -rx zpack and tessalon and albuterol MDI -pt to follow up in 3 months.  She is to RTO sooner prn

## 2016-08-29 ENCOUNTER — Encounter: Payer: Self-pay | Admitting: Physician Assistant

## 2016-08-29 ENCOUNTER — Ambulatory Visit: Payer: Self-pay | Admitting: Physician Assistant

## 2016-08-29 VITALS — BP 116/70 | HR 86 | Temp 98.1°F | Ht 65.0 in | Wt 217.0 lb

## 2016-08-29 DIAGNOSIS — F17219 Nicotine dependence, cigarettes, with unspecified nicotine-induced disorders: Secondary | ICD-10-CM

## 2016-08-29 DIAGNOSIS — N898 Other specified noninflammatory disorders of vagina: Secondary | ICD-10-CM

## 2016-08-29 DIAGNOSIS — J449 Chronic obstructive pulmonary disease, unspecified: Secondary | ICD-10-CM

## 2016-08-29 DIAGNOSIS — E785 Hyperlipidemia, unspecified: Secondary | ICD-10-CM

## 2016-08-29 DIAGNOSIS — K219 Gastro-esophageal reflux disease without esophagitis: Secondary | ICD-10-CM

## 2016-08-29 NOTE — Progress Notes (Signed)
BP 116/70 (BP Location: Left Arm, Patient Position: Sitting, Cuff Size: Normal)   Pulse 86   Temp 98.1 F (36.7 C)   Ht 5\' 5"  (1.651 m)   Wt 217 lb (98.4 kg)   SpO2 97%   BMI 36.11 kg/m    Subjective:    Patient ID: Lorraine Peters, female    DOB: 05-08-1970, 46 y.o.   MRN: [id number]  HPI: Lorraine Peters is a 46 y.o. female presenting on 08/29/2016 for Hyperlipidemia   HPI   Pt says breathing good.  She is still smoking  Pt states itchy bumps in genital area.  Pt does not want chaperone for exam.   Denies risks for STDs. No vaginal discharge.  States the itching seems to come and go  Relevant past medical, surgical, family and social history reviewed and updated as indicated. Interim medical history since our last visit reviewed. Allergies and medications reviewed and updated.   Current Outpatient Prescriptions:  .  albuterol (PROVENTIL HFA;VENTOLIN HFA) 108 (90 Base) MCG/ACT inhaler, Inhale 2 puffs into the lungs every 6 (six) hours as needed for wheezing or shortness of breath., Disp: 1 Inhaler, Rfl: 1 .  almotriptan (AXERT) 12.5 MG tablet, TAKE 1 TABLET BY MOUTH AT ONSET OF HEADACHE, MAY REPEAT IN 2 HOURS IF NO RELIEF. DO NOT EXCEED 2 TABLETS IN 24 HOURS., Disp: 12 tablet, Rfl: 1 .  atorvastatin (LIPITOR) 20 MG tablet, TAKE 1 Tablet BY MOUTH ONCE DAILY, Disp: 90 tablet, Rfl: 2 .  benzonatate (TESSALON PERLES) 100 MG capsule, 1-2 po q 8 hour prn coug, Disp: 20 capsule, Rfl: 3 .  loratadine-pseudoephedrine (CLARITIN-D 12 HOUR) 5-120 MG tablet, Take 1 tablet by mouth 2 (two) times daily., Disp: 20 tablet, Rfl: 0 .  omeprazole (PRILOSEC) 20 MG capsule, TAKE 2 Capsules BY MOUTH ONCE DAILY FOR GERD, Disp: 180 capsule, Rfl: 3 .  piroxicam (FELDENE) 20 MG capsule, Take 1 capsule (20 mg total) by mouth daily as needed., Disp: 30 capsule, Rfl: 3   Review of Systems  Constitutional: Negative for appetite change, chills, diaphoresis, fatigue, fever and unexpected weight change.  HENT:  Negative for congestion, dental problem, drooling, ear pain, facial swelling, hearing loss, mouth sores, sneezing, sore throat, trouble swallowing and voice change.   Eyes: Negative for pain, discharge, redness, itching and visual disturbance.  Respiratory: Negative for cough, choking, shortness of breath and wheezing.   Cardiovascular: Negative for chest pain, palpitations and leg swelling.  Gastrointestinal: Negative for abdominal pain, blood in stool, constipation, diarrhea and vomiting.  Endocrine: Negative for cold intolerance, heat intolerance and polydipsia.  Genitourinary: Negative for decreased urine volume, dysuria and hematuria.  Musculoskeletal: Negative for arthralgias, back pain and gait problem.  Skin: Negative for rash.  Allergic/Immunologic: Negative for environmental allergies.  Neurological: Positive for headaches. Negative for seizures, syncope and light-headedness.  Hematological: Negative for adenopathy.  Psychiatric/Behavioral: Negative for agitation, dysphoric mood and suicidal ideas. The patient is not nervous/anxious.     Per HPI unless specifically indicated above     Objective:    BP 116/70 (BP Location: Left Arm, Patient Position: Sitting, Cuff Size: Normal)   Pulse 86   Temp 98.1 F (36.7 C)   Ht 5\' 5"  (1.651 m)   Wt 217 lb (98.4 kg)   SpO2 97%   BMI 36.11 kg/m   Wt Readings from Last 3 Encounters:  08/29/16 217 lb (98.4 kg)  05/30/16 223 lb (101.2 kg)  05/24/16 208 lb (94.3 kg)  Physical Exam  Constitutional: She is oriented to person, place, and time. She appears well-developed and well-nourished.  HENT:  Head: Normocephalic and atraumatic.  Neck: Neck supple.  Cardiovascular: Normal rate and regular rhythm.   Pulmonary/Chest: Effort normal and breath sounds normal.  Abdominal: Soft. Bowel sounds are normal. She exhibits no mass. There is no hepatosplenomegaly. There is no tenderness.  Genitourinary: There is no rash, tenderness or lesion on  the right labia. There is no rash, tenderness or lesion on the left labia.  Musculoskeletal: She exhibits no edema.  Lymphadenopathy:    She has no cervical adenopathy.  Neurological: She is alert and oriented to person, place, and time.  Skin: Skin is warm and dry.  Psychiatric: She has a normal mood and affect. Her behavior is normal.  Vitals reviewed.       Assessment & Plan:   Encounter Diagnoses  Name Primary?  . Chronic obstructive pulmonary disease, unspecified COPD type ([place]) Yes  . Cigarette nicotine dependence with nicotine-induced disorder   . Hyperlipidemia, unspecified hyperlipidemia type   . Gastroesophageal reflux disease, esophagitis presence not specified   . Itching in the vaginal area     -pt to continue current medications -counseled smoking cessation -F/u 3 months -pt counseled to use dove or lever soap. Wear cotton underwear. Avoid tight, rubbing pants/jeans.  Avoid overly hot shower.  Avoid bathtub. -pt counseled to RTO for recheck in 1 month if genital symptoms don't improve

## 2016-09-01 ENCOUNTER — Other Ambulatory Visit: Payer: Self-pay | Admitting: Physician Assistant

## 2016-09-06 ENCOUNTER — Other Ambulatory Visit: Payer: Self-pay | Admitting: Physician Assistant

## 2016-09-10 ENCOUNTER — Other Ambulatory Visit: Payer: Self-pay | Admitting: Physician Assistant

## 2016-09-10 MED ORDER — RIZATRIPTAN BENZOATE 10 MG PO TABS: 10.0000 mg | ORAL_TABLET | ORAL | 0 refills | Status: DC | PRN

## 2016-11-20 ENCOUNTER — Other Ambulatory Visit (HOSPITAL_COMMUNITY)
Admission: RE | Admit: 2016-11-20 | Discharge: 2016-11-20 | Disposition: A | Discharge status: Home / Self Care | Payer: Self-pay | Source: Ambulatory Visit | Attending: Physician Assistant | Admitting: Physician Assistant

## 2016-11-20 DIAGNOSIS — E785 Hyperlipidemia, unspecified: Secondary | ICD-10-CM | POA: Insufficient documentation

## 2016-11-20 LAB — COMPREHENSIVE METABOLIC PANEL
ALK PHOS: 74 U/L (ref 38–126)
ALT: 16 U/L (ref 14–54)
AST: 14 U/L — ABNORMAL LOW (ref 15–41)
Albumin: 3.7 g/dL (ref 3.5–5.0)
Anion gap: 7 (ref 5–15)
BILIRUBIN TOTAL: 0.4 mg/dL (ref 0.3–1.2)
BUN: 16 mg/dL (ref 6–20)
CHLORIDE: 103 mmol/L (ref 101–111)
CO2: 27 mmol/L (ref 22–32)
CREATININE: 0.61 mg/dL (ref 0.44–1.00)
Calcium: 8.8 mg/dL — ABNORMAL LOW (ref 8.9–10.3)
GFR calc Af Amer: >60 mL/min (ref >60)
Glucose, Bld: 115 mg/dL — ABNORMAL HIGH (ref 65–99)
Potassium: 4.4 mmol/L (ref 3.5–5.1)
Sodium: 137 mmol/L (ref 135–145)
TOTAL PROTEIN: 6.4 g/dL — AB (ref 6.5–8.1)

## 2016-11-20 LAB — LIPID PANEL
Cholesterol: 142 mg/dL (ref 0–200)
HDL: 43 mg/dL (ref >40)
LDL CALC: 83 mg/dL (ref 0–99)
TRIGLYCERIDES: 79 mg/dL (ref <150)
Total CHOL/HDL Ratio: 3.3 RATIO
VLDL: 16 mg/dL (ref 0–40)

## 2016-11-27 ENCOUNTER — Other Ambulatory Visit: Payer: Self-pay | Admitting: Physician Assistant

## 2016-11-28 ENCOUNTER — Encounter: Payer: Self-pay | Admitting: Physician Assistant

## 2016-11-28 ENCOUNTER — Ambulatory Visit: Payer: Self-pay | Admitting: Physician Assistant

## 2016-11-28 VITALS — BP 122/82 | HR 90 | Temp 97.9°F | Ht 65.0 in | Wt 215.2 lb

## 2016-11-28 DIAGNOSIS — K219 Gastro-esophageal reflux disease without esophagitis: Secondary | ICD-10-CM

## 2016-11-28 DIAGNOSIS — F17219 Nicotine dependence, cigarettes, with unspecified nicotine-induced disorders: Secondary | ICD-10-CM

## 2016-11-28 DIAGNOSIS — J449 Chronic obstructive pulmonary disease, unspecified: Secondary | ICD-10-CM

## 2016-11-28 DIAGNOSIS — Z1239 Encounter for other screening for malignant neoplasm of breast: Secondary | ICD-10-CM

## 2016-11-28 DIAGNOSIS — J019 Acute sinusitis, unspecified: Secondary | ICD-10-CM

## 2016-11-28 DIAGNOSIS — E785 Hyperlipidemia, unspecified: Secondary | ICD-10-CM

## 2016-11-28 DIAGNOSIS — E669 Obesity, unspecified: Secondary | ICD-10-CM

## 2016-11-28 MED ORDER — AMOXICILLIN 500 MG PO CAPS: 500.0000 mg | ORAL_CAPSULE | Freq: Three times a day (TID) | ORAL | 0 refills | Status: DC

## 2016-11-28 NOTE — Patient Instructions (Signed)
Varenicline oral tablets What is this medicine? VARENICLINE (var EN i kleen) is used to help people quit smoking. It can reduce the symptoms caused by stopping smoking. It is used with a patient support program recommended by your physician. This medicine may be used for other purposes; ask your health care provider or pharmacist if you have questions. COMMON BRAND NAME(S): Chantix What should I tell my health care provider before I take this medicine? They need to know if you have any of these conditions: -bipolar disorder, depression, schizophrenia or other mental illness -heart disease -if you often drink alcohol -kidney disease -peripheral vascular disease -seizures -stroke -suicidal thoughts, plans, or attempt; a previous suicide attempt by you or a family member -an unusual or allergic reaction to varenicline, other medicines, foods, dyes, or preservatives -pregnant or trying to get pregnant -breast-feeding How should I use this medicine? Take this medicine by mouth after eating. Take with a full glass of water. Follow the directions on the prescription label. Take your doses at regular intervals. Do not take your medicine more often than directed. There are 3 ways you can use this medicine to help you quit smoking; talk to your health care professional to decide which plan is right for you: 1) you can choose a quit date and start this medicine 1 week before the quit date, or, 2) you can start taking this medicine before you choose a quit date, and then pick a quit date between day 8 and 35 days of treatment, or, 3) if you are not sure that you are able or willing to quit smoking right away, start taking this medicine and slowly decrease the amount you smoke as directed by your health care professional with the goal of being cigarette-free by week 12 of treatment. Stick to your plan; ask about support groups or other ways to help you remain cigarette-free. If you are motivated to quit  smoking and did not succeed during a previous attempt with this medicine for reasons other than side effects, or if you returned to smoking after this treatment, speak with your health care professional about whether another course of this medicine may be right for you. A special MedGuide will be given to you by the pharmacist with each prescription and refill. Be sure to read this information carefully each time. Talk to your pediatrician regarding the use of this medicine in children. This medicine is not approved for use in children. Overdosage: If you think you have taken too much of this medicine contact a poison control center or emergency room at once. NOTE: This medicine is only for you. Do not share this medicine with others. What if I miss a dose? If you miss a dose, take it as soon as you can. If it is almost time for your next dose, take only that dose. Do not take double or extra doses. What may interact with this medicine? -alcohol or any product that contains alcohol -insulin -other stop smoking aids -theophylline -warfarin This list may not describe all possible interactions. Give your health care provider a list of all the medicines, herbs, non-prescription drugs, or dietary supplements you use. Also tell them if you smoke, drink alcohol, or use illegal drugs. Some items may interact with your medicine. What should I watch for while using this medicine? Visit your doctor or health care professional for regular check ups. Ask for ongoing advice and encouragement from your doctor or healthcare professional, friends, and family to help you quit. If   you smoke while on this medication, quit again Your mouth may get dry. Chewing sugarless gum or sucking hard candy, and drinking plenty of water may help. Contact your doctor if the problem does not go away or is severe. You may get drowsy or dizzy. Do not drive, use machinery, or do anything that needs mental alertness until you know how  this medicine affects you. Do not stand or sit up quickly, especially if you are an older patient. This reduces the risk of dizzy or fainting spells. Sleepwalking can happen during treatment with this medicine, and can sometimes lead to behavior that is harmful to you, other people, or property. Stop taking this medicine and tell your doctor if you start sleepwalking or have other unusual sleep-related activity. Decrease the amount of alcoholic beverages that you drink during treatment with this medicine until you know if this medicine affects your ability to tolerate alcohol. Some people have experienced increased drunkenness (intoxication), unusual or sometimes aggressive behavior, or no memory of things that have happened (amnesia) during treatment with this medicine. The use of this medicine may increase the chance of suicidal thoughts or actions. Pay special attention to how you are responding while on this medicine. Any worsening of mood, or thoughts of suicide or dying should be reported to your health care professional right away. What side effects may I notice from receiving this medicine? Side effects that you should report to your doctor or health care professional as soon as possible: -allergic reactions like skin rash, itching or hives, swelling of the face, lips, tongue, or throat -acting aggressive, being angry or violent, or acting on dangerous impulses -breathing problems -changes in vision -chest pain or chest tightness -confusion, trouble speaking or understanding -new or worsening depression, anxiety, or panic attacks -extreme increase in activity and talking (mania) -fast, irregular heartbeat -feeling faint or lightheaded, falls -fever -pain in legs when walking -problems with balance, talking, walking -redness, blistering, peeling or loosening of the skin, including inside the mouth -ringing in ears -seeing or hearing things that aren't there  (hallucinations) -seizures -sleepwalking -sudden numbness or weakness of the face, arm or leg -thoughts about suicide or dying, or attempts to commit suicide -trouble passing urine or change in the amount of urine -unusual bleeding or bruising -unusually weak or tired Side effects that usually do not require medical attention (report to your doctor or health care professional if they continue or are bothersome): -constipation -headache -nausea, vomiting -strange dreams -stomach gas -trouble sleeping This list may not describe all possible side effects. Call your doctor for medical advice about side effects. You may report side effects to FDA at 1-800-FDA-1088. Where should I keep my medicine? Keep out of the reach of children. Store at room temperature between 15 and 30 degrees C (59 and 86 degrees F). Throw away any unused medicine after the expiration date. NOTE: This sheet is a summary. It may not cover all possible information. If you have questions about this medicine, talk to your doctor, pharmacist, or health care provider.  2018 Elsevier/Gold Standard (2014-11-10 16:14:23)  

## 2016-11-28 NOTE — Progress Notes (Signed)
BP 122/82 (BP Location: Left Arm, Patient Position: Sitting, Cuff Size: Normal)   Pulse 90   Temp 97.9 F (36.6 C)   Ht 5\' 5"  (1.651 m)   Wt 215 lb 4 oz (97.6 kg)   SpO2 97%   BMI 35.82 kg/m    Subjective:    Patient ID: [name], female    DOB: [birth year], 46 y.o.   MRN: [id number]  HPI: JENAVEVE FENSTERMAKER is a 46 y.o. female presenting on 11/28/2016 for Follow-up   HPI   Pt tired- mother has breast cancer and lung cancer.  Pt thinks she has sinus infection.  Needing sudafed daily.  Also congrestion.    Relevant past medical, surgical, family and social history reviewed and updated as indicated. Interim medical history since our last visit reviewed. Allergies and medications reviewed and updated.   Current Outpatient Prescriptions:  .  albuterol (PROVENTIL HFA;VENTOLIN HFA) 108 (90 Base) MCG/ACT inhaler, Inhale 2 puffs into the lungs every 6 (six) hours as needed for wheezing or shortness of breath., Disp: 1 Inhaler, Rfl: 1 .  atorvastatin (LIPITOR) 20 MG tablet, TAKE 1 Tablet BY MOUTH ONCE DAILY, Disp: 90 tablet, Rfl: 2 .  loratadine-pseudoephedrine (CLARITIN-D 12 HOUR) 5-120 MG tablet, Take 1 tablet by mouth 2 (two) times daily., Disp: 20 tablet, Rfl: 0 .  MAXALT 10 MG tablet, TAKE 1 TABLET BY MOUTH AT ONSET OF HEADACHE, MAY REPEAT IN 2 HOURS. MAX 2 TABLETS IN 24 HOURS., Disp: 15 tablet, Rfl: 0 .  omeprazole (PRILOSEC) 20 MG capsule, TAKE 2 Capsules BY MOUTH ONCE DAILY FOR GERD, Disp: 180 capsule, Rfl: 3 .  piroxicam (FELDENE) 20 MG capsule, Take 1 capsule (20 mg total) by mouth daily as needed., Disp: 30 capsule, Rfl: 3  Review of Systems  Constitutional: Negative for appetite change, chills, diaphoresis, fatigue, fever and unexpected weight change.  HENT: Negative for congestion, dental problem, drooling, ear pain, facial swelling, hearing loss, mouth sores, sneezing, sore throat, trouble swallowing and voice change.   Eyes: Negative for pain, discharge, redness, itching  and visual disturbance.  Respiratory: Negative for cough, choking, shortness of breath and wheezing.   Cardiovascular: Negative for chest pain, palpitations and leg swelling.  Gastrointestinal: Negative for abdominal pain, blood in stool, constipation, diarrhea and vomiting.  Endocrine: Negative for cold intolerance, heat intolerance and polydipsia.  Genitourinary: Negative for decreased urine volume, dysuria and hematuria.  Musculoskeletal: Negative for arthralgias, back pain and gait problem.  Skin: Negative for rash.  Allergic/Immunologic: Negative for environmental allergies.  Neurological: Positive for headaches. Negative for seizures, syncope and light-headedness.  Hematological: Negative for adenopathy.  Psychiatric/Behavioral: Negative for agitation, dysphoric mood and suicidal ideas. The patient is nervous/anxious.     Per HPI unless specifically indicated above     Objective:    BP 122/82 (BP Location: Left Arm, Patient Position: Sitting, Cuff Size: Normal)   Pulse 90   Temp 97.9 F (36.6 C)   Ht 5\' 5"  (1.651 m)   Wt 215 lb 4 oz (97.6 kg)   SpO2 97%   BMI 35.82 kg/m   Wt Readings from Last 3 Encounters:  11/28/16 215 lb 4 oz (97.6 kg)  08/29/16 217 lb (98.4 kg)  05/30/16 223 lb (101.2 kg)    Physical Exam  Constitutional: She is oriented to person, place, and time. She appears well-developed and well-nourished.  HENT:  Head: Normocephalic and atraumatic.  Right Ear: Hearing, tympanic membrane, external ear and ear canal normal.  Left Ear:  Hearing, tympanic membrane, external ear and ear canal normal.  Nose: Mucosal edema and rhinorrhea present. Right sinus exhibits maxillary sinus tenderness. Left sinus exhibits maxillary sinus tenderness.  Mouth/Throat: Uvula is midline and oropharynx is clear and moist. No oropharyngeal exudate.  Neck: Neck supple.  Cardiovascular: Normal rate and regular rhythm.   Pulmonary/Chest: Effort normal and breath sounds normal. She has  no wheezes.  Abdominal: Soft. Bowel sounds are normal. She exhibits no mass. There is no hepatosplenomegaly. There is no tenderness.  Musculoskeletal: She exhibits no edema.  Lymphadenopathy:    She has no cervical adenopathy.  Neurological: She is alert and oriented to person, place, and time.  Skin: Skin is warm and dry.  Psychiatric: She has a normal mood and affect. Her behavior is normal.  Vitals reviewed.   Results for orders placed or performed during the hospital encounter of 11/20/16  Comprehensive metabolic panel  Result Value Ref Range   Sodium 137 135 - 145 mmol/L   Potassium 4.4 3.5 - 5.1 mmol/L   Chloride 103 101 - 111 mmol/L   CO2 27 22 - 32 mmol/L   Glucose, Bld 115 (H) 65 - 99 mg/dL   BUN 16 6 - 20 mg/dL   Creatinine, Ser 0.61 0.44 - 1.00 mg/dL   Calcium 8.8 (L) 8.9 - 10.3 mg/dL   Total Protein 6.4 (L) 6.5 - 8.1 g/dL   Albumin 3.7 3.5 - 5.0 g/dL   AST 14 (L) 15 - 41 U/L   ALT 16 14 - 54 U/L   Alkaline Phosphatase 74 38 - 126 U/L   Total Bilirubin 0.4 0.3 - 1.2 mg/dL   GFR calc non Af Amer >60 >60 mL/min   GFR calc Af Amer >60 >60 mL/min   Anion gap 7 5 - 15  Lipid panel  Result Value Ref Range   Cholesterol 142 0 - 200 mg/dL   Triglycerides 79 <150 mg/dL   HDL 43 >40 mg/dL   Total CHOL/HDL Ratio 3.3 RATIO   VLDL 16 0 - 40 mg/dL   LDL Cholesterol 83 0 - 99 mg/dL      Assessment & Plan:   Encounter Diagnoses  Name Primary?  . Chronic obstructive pulmonary disease, unspecified COPD type ([place]) Yes  . Cigarette nicotine dependence with nicotine-induced disorder   . Hyperlipidemia, unspecified hyperlipidemia type   . Gastroesophageal reflux disease, esophagitis presence not specified   . Acute sinusitis, recurrence not specified, unspecified location   . Obesity, unspecified classification, unspecified obesity type, unspecified whether serious comorbidity present   . Screening for breast cancer     -reviewed labs with pt -will order screening  Mammogram -Discussed chantix. Risks and benefits.  Pt given handout.  She will call next week if she desires to proceed with medication -rx amoxil for sinus -F/u 4 months.  RTO sooner prn

## 2016-12-05 ENCOUNTER — Other Ambulatory Visit: Payer: Self-pay | Admitting: Physician Assistant

## 2016-12-05 MED ORDER — VARENICLINE TARTRATE 0.5 MG X 11 & 1 MG X 42 PO MISC: ORAL | 0 refills | Status: DC

## 2016-12-05 MED ORDER — VARENICLINE TARTRATE 1 MG PO TABS: 1.0000 mg | ORAL_TABLET | Freq: Two times a day (BID) | ORAL | 0 refills | Status: DC

## 2016-12-20 ENCOUNTER — Other Ambulatory Visit: Payer: Self-pay | Admitting: Physician Assistant

## 2016-12-20 DIAGNOSIS — Z1231 Encounter for screening mammogram for malignant neoplasm of breast: Secondary | ICD-10-CM

## 2016-12-24 ENCOUNTER — Ambulatory Visit: Payer: Self-pay | Admitting: Physician Assistant

## 2017-01-02 ENCOUNTER — Ambulatory Visit: Payer: Self-pay | Admitting: Physician Assistant

## 2017-01-02 DIAGNOSIS — Z23 Encounter for immunization: Secondary | ICD-10-CM

## 2017-01-02 NOTE — Patient Instructions (Signed)

## 2017-02-24 ENCOUNTER — Other Ambulatory Visit: Payer: Self-pay | Admitting: Physician Assistant

## 2017-02-26 ENCOUNTER — Other Ambulatory Visit: Payer: Self-pay | Admitting: Physician Assistant

## 2017-02-27 ENCOUNTER — Ambulatory Visit
Admission: RE | Admit: 2017-02-27 | Discharge: 2017-02-27 | Disposition: A | Discharge status: Home / Self Care | Payer: No Typology Code available for payment source | Source: Ambulatory Visit | Attending: Physician Assistant | Admitting: Physician Assistant

## 2017-02-27 DIAGNOSIS — Z1231 Encounter for screening mammogram for malignant neoplasm of breast: Secondary | ICD-10-CM

## 2017-02-28 ENCOUNTER — Other Ambulatory Visit: Payer: Self-pay | Admitting: Physician Assistant

## 2017-03-15 ENCOUNTER — Emergency Department (HOSPITAL_COMMUNITY): Payer: Self-pay

## 2017-03-15 ENCOUNTER — Other Ambulatory Visit: Payer: Self-pay

## 2017-03-15 ENCOUNTER — Encounter (HOSPITAL_COMMUNITY): Payer: Self-pay

## 2017-03-15 ENCOUNTER — Emergency Department (HOSPITAL_COMMUNITY)
Admission: EM | Admit: 2017-03-15 | Discharge: 2017-03-15 | Disposition: A | Discharge status: Home / Self Care | Payer: Self-pay | Attending: Emergency Medicine | Admitting: Emergency Medicine

## 2017-03-15 DIAGNOSIS — Z79899 Other long term (current) drug therapy: Secondary | ICD-10-CM | POA: Insufficient documentation

## 2017-03-15 DIAGNOSIS — T39395A Adverse effect of other nonsteroidal anti-inflammatory drugs [NSAID], initial encounter: Secondary | ICD-10-CM | POA: Insufficient documentation

## 2017-03-15 DIAGNOSIS — F17210 Nicotine dependence, cigarettes, uncomplicated: Secondary | ICD-10-CM | POA: Insufficient documentation

## 2017-03-15 DIAGNOSIS — K2960 Other gastritis without bleeding: Secondary | ICD-10-CM | POA: Insufficient documentation

## 2017-03-15 DIAGNOSIS — R140 Abdominal distension (gaseous): Secondary | ICD-10-CM | POA: Insufficient documentation

## 2017-03-15 DIAGNOSIS — R110 Nausea: Secondary | ICD-10-CM | POA: Insufficient documentation

## 2017-03-15 LAB — URINALYSIS, ROUTINE W REFLEX MICROSCOPIC
BILIRUBIN URINE: NEGATIVE
Glucose, UA: NEGATIVE mg/dL
Hgb urine dipstick: NEGATIVE
Ketones, ur: NEGATIVE mg/dL
LEUKOCYTES UA: NEGATIVE
NITRITE: NEGATIVE
Protein, ur: NEGATIVE mg/dL
SPECIFIC GRAVITY, URINE: 1.02 (ref 1.005–1.030)
pH: 5 (ref 5.0–8.0)

## 2017-03-15 LAB — CBC
HEMATOCRIT: 39.4 % (ref 36.0–46.0)
HEMOGLOBIN: 12.4 g/dL (ref 12.0–15.0)
MCH: 25.9 pg — ABNORMAL LOW (ref 26.0–34.0)
MCHC: 31.5 g/dL (ref 30.0–36.0)
MCV: 82.3 fL (ref 78.0–100.0)
Platelets: 288 10*3/uL (ref 150–400)
RBC: 4.79 MIL/uL (ref 3.87–5.11)
RDW: 15.7 % — ABNORMAL HIGH (ref 11.5–15.5)
WBC: 8.6 10*3/uL (ref 4.0–10.5)

## 2017-03-15 LAB — COMPREHENSIVE METABOLIC PANEL
ALK PHOS: 86 U/L (ref 38–126)
ALT: 18 U/L (ref 14–54)
ANION GAP: 7 (ref 5–15)
AST: 16 U/L (ref 15–41)
Albumin: 3.7 g/dL (ref 3.5–5.0)
BILIRUBIN TOTAL: 0.1 mg/dL — AB (ref 0.3–1.2)
BUN: 12 mg/dL (ref 6–20)
CALCIUM: 9.2 mg/dL (ref 8.9–10.3)
CO2: 25 mmol/L (ref 22–32)
Chloride: 107 mmol/L (ref 101–111)
Creatinine, Ser: 0.53 mg/dL (ref 0.44–1.00)
GFR calc non Af Amer: >60 mL/min (ref >60)
Glucose, Bld: 120 mg/dL — ABNORMAL HIGH (ref 65–99)
Potassium: 3.6 mmol/L (ref 3.5–5.1)
Sodium: 139 mmol/L (ref 135–145)
TOTAL PROTEIN: 6.8 g/dL (ref 6.5–8.1)

## 2017-03-15 LAB — POC URINE PREG, ED: Preg Test, Ur: NEGATIVE

## 2017-03-15 LAB — LIPASE, BLOOD: Lipase: 28 U/L (ref 11–51)

## 2017-03-15 MED ORDER — DICYCLOMINE HCL 20 MG PO TABS: 20.0000 mg | ORAL_TABLET | Freq: Two times a day (BID) | ORAL | 0 refills | Status: DC | PRN

## 2017-03-15 MED ORDER — SUCRALFATE 1 G PO TABS: 1.0000 g | ORAL_TABLET | Freq: Three times a day (TID) | ORAL | 0 refills | Status: DC

## 2017-03-15 MED ORDER — GI COCKTAIL ~~LOC~~
30.0000 mL | Freq: Once | ORAL | Status: AC
  Administered 2017-03-15: 30 mL via ORAL
  Filled 2017-03-15: qty 30

## 2017-03-15 NOTE — ED Provider Notes (Signed)
[facility] EMERGENCY DEPARTMENT Provider Note   CSN: [id number] Arrival date & time: 03/15/17  1203     History   Chief Complaint Chief Complaint  Patient presents with  . Abdominal Pain    HPI Lorraine Peters is a 47 y.o. female.  HPI Patient states she is been taking naproxen regularly for the past 3 weeks for sciatica related pain.  Over the last week she is developed upper abdominal pain and distention.  She is taking multiple days of MiraLAX and has had multiple loose stools with no resolution of her symptoms.  She has had nausea but no vomiting.  Denies any melanotic or grossly bloody stool.  No fever or chills.  Denies any urinary symptoms. Past Medical History:  Diagnosis Date  . Hypercholesterolemia   . Kidney stones   . Ovarian cyst   . Reflux     Patient Active Problem List   Diagnosis Date Noted  . Pelvic pain in female 03/22/2015  . History of ovarian cyst 03/22/2015  . Abnormal vaginal bleeding 03/22/2015  . Hyperlipidemia 02/21/2015  . Esophageal reflux 02/21/2015  . Cigarette nicotine dependence, uncomplicated 53/61/4431  . Obesity, unspecified 02/21/2015  . Excessive bleeding in premenopausal period 02/21/2015    Past Surgical History:  Procedure Laterality Date  . TUBAL LIGATION      OB History    Gravida Para Term Preterm AB Living             1   SAB TAB Ectopic Multiple Live Births                   Home Medications    Prior to Admission medications   Medication Sig Start Date End Date Taking? Authorizing Provider  albuterol (PROVENTIL HFA;VENTOLIN HFA) 108 (90 Base) MCG/ACT inhaler Inhale 2 puffs into the lungs every 6 (six) hours as needed for wheezing or shortness of breath. 05/30/16  Yes Soyla Dryer, PA-C  atorvastatin (LIPITOR) 20 MG tablet TAKE 1 Tablet BY MOUTH ONCE DAILY 09/02/16  Yes McElroy, Shannon, PA-C  MAXALT 10 MG tablet TAKE 1 TABLET BY MOUTH AT ONSET OF HEADACHE, MAY REPEAT IN 2 HOURS. MAX 2 TABLETS IN 24 HOURS.  02/24/17  Yes Soyla Dryer, PA-C  omeprazole (PRILOSEC) 20 MG capsule TAKE 2 Capsules BY MOUTH ONCE DAILY FOR GERD 03/02/17  Yes Soyla Dryer, PA-C  amoxicillin (AMOXIL) 500 MG capsule Take 1 capsule (500 mg total) by mouth 3 (three) times daily. Patient not taking: Reported on 03/15/2017 11/28/16   Soyla Dryer, PA-C  dicyclomine (BENTYL) 20 MG tablet Take 1 tablet (20 mg total) by mouth 2 (two) times daily as needed for spasms. 03/15/17   Julianne Rice, MD  sucralfate (CARAFATE) 1 g tablet Take 1 tablet (1 g total) by mouth 4 (four) times daily -  with meals and at bedtime. 03/15/17   Julianne Rice, MD  varenicline (CHANTIX CONTINUING MONTH PAK) 1 MG tablet Take 1 tablet (1 mg total) by mouth 2 (two) times daily. Patient not taking: Reported on 03/15/2017 12/05/16   Soyla Dryer, PA-C  varenicline (CHANTIX STARTING MONTH PAK) 0.5 MG X 11 & 1 MG X 42 tablet Take one 0.5 mg tablet PO once daily for 3 days, then increase to one 0.5 mg tablet bid for 4 days, then increase to one 1 mg tablet bid. Patient not taking: Reported on 03/15/2017 12/05/16   Soyla Dryer, PA-C    Family History Family History  Problem Relation Age of Onset  .  Arthritis Mother   . Cancer Mother        breast  . Breast cancer Mother 61  . COPD Father   . Heart disease Father   . Hyperlipidemia Father   . Hypertension Father   . Cancer Sister        anal  . Stroke Maternal Grandfather   . Cancer Maternal Aunt        breast  . Breast cancer Maternal Aunt   . Cancer Maternal Uncle        breast  . Breast cancer Maternal Uncle     Social History Social History   Tobacco Use  . Smoking status: Current Every Day Smoker    Packs/day: 0.50    Years: 28.00    Pack years: 14.00    Types: Cigarettes  . Smokeless tobacco: Never Used  Substance Use Topics  . Alcohol use: No    Alcohol/week: 0.0 oz  . Drug use: No     Allergies   Bee venom   Review of Systems Review of Systems    Constitutional: Negative for chills and fever.  Respiratory: Negative for cough and shortness of breath.   Cardiovascular: Negative for chest pain.  Gastrointestinal: Positive for abdominal pain, diarrhea and nausea. Negative for constipation and vomiting.  Genitourinary: Negative for dysuria, flank pain and frequency.  Musculoskeletal: Negative for back pain, myalgias, neck pain and neck stiffness.  Skin: Negative for rash and wound.  Neurological: Negative for dizziness, weakness, light-headedness and numbness.  All other systems reviewed and are negative.    Physical Exam Updated Vital Signs BP (!) 169/89 (BP Location: Right Arm)   Pulse 100   Temp 98.4 F (36.9 C) (Oral)   Resp 18   Ht 5\' 5"  (1.651 m)   Wt 98.4 kg (217 lb)   LMP 02/27/2017   SpO2 98%   BMI 36.11 kg/m   Physical Exam  Constitutional: She is oriented to person, place, and time. She appears well-developed and well-nourished.  HENT:  Head: Normocephalic and atraumatic.  Mouth/Throat: Oropharynx is clear and moist.  Eyes: EOM are normal. Pupils are equal, round, and reactive to light.  Neck: Normal range of motion. Neck supple.  Cardiovascular: Normal rate and regular rhythm.  Pulmonary/Chest: Effort normal and breath sounds normal.  Abdominal: Soft. Bowel sounds are normal. There is tenderness. There is no rebound and no guarding.  Patient with epigastric tenderness to palpation.  No rebound or guarding.  Musculoskeletal: Normal range of motion. She exhibits no edema or tenderness.  No CVA tenderness bilaterally.  Neurological: She is alert and oriented to person, place, and time.  Skin: Skin is warm and dry. No rash noted. No erythema.  Psychiatric: She has a normal mood and affect. Her behavior is normal.  Nursing note and vitals reviewed.    ED Treatments / Results  Labs (all labs ordered are listed, but only abnormal results are displayed) Labs Reviewed  COMPREHENSIVE METABOLIC PANEL -  Abnormal; Notable for the following components:      Result Value   Glucose, Bld 120 (*)    Total Bilirubin 0.1 (*)    All other components within normal limits  CBC - Abnormal; Notable for the following components:   MCH 25.9 (*)    RDW 15.7 (*)    All other components within normal limits  URINALYSIS, ROUTINE W REFLEX MICROSCOPIC - Abnormal; Notable for the following components:   APPearance HAZY (*)    All other components within  normal limits  LIPASE, BLOOD  POC URINE PREG, ED    EKG  EKG Interpretation None       Radiology Dg Abd Acute W/chest  Result Date: 03/15/2017 CLINICAL DATA:  Abdominal pain and distention EXAM: DG ABDOMEN ACUTE W/ 1V CHEST COMPARISON:  Chest x-ray 05/24/2016 FINDINGS: The lungs are clear without focal pneumonia, edema, pneumothorax or pleural effusion. The cardiopericardial silhouette is within normal limits for size. The visualized bony structures of the thorax are intact. Upright film shows no evidence for intraperitoneal free air. There is no evidence for gaseous bowel dilation to suggest obstruction. No unexpected abdominopelvic calcification. Visualized bony anatomy is unremarkable. IMPRESSION: 1. No acute cardiopulmonary findings. 2. No evidence for bowel perforation or obstruction. Electronically Signed   By: Misty Stanley M.D.   On: 03/15/2017 18:08    Procedures Procedures (including critical care time)  Medications Ordered in ED Medications  gi cocktail (Maalox,Lidocaine,Donnatal) (30 mLs Oral Given 03/15/17 1610)     Initial Impression / Assessment and Plan / ED Course  I have reviewed the triage vital signs and the nursing notes.  Pertinent labs & imaging results that were available during my care of the patient were reviewed by me and considered in my medical decision making (see chart for details).    Patient states abdominal pain is improved after GI cocktail.  Abdominal exam is benign.  Symptoms likely due to NSAID induced  gastritis.  Advised to avoid all NSAIDs and continue PPI.  Will start on Carafate.  Patient's been advised to follow-up with gastroenterology and return precautions have been given.   Final Clinical Impressions(s) / ED Diagnoses   Final diagnoses:  NSAID induced gastritis    ED Discharge Orders        Ordered    sucralfate (CARAFATE) 1 g tablet  3 times daily with meals & bedtime     03/15/17 1820    dicyclomine (BENTYL) 20 MG tablet  2 times daily PRN     03/15/17 1820       Julianne Rice, MD 03/15/17 Vernelle Emerald

## 2017-03-15 NOTE — ED Triage Notes (Addendum)
Patient reports of abdominal pain x 1 week with nausea. Patient reports of 1 bowel movement within 3 days. Has been taking Miralax.

## 2017-03-15 NOTE — Discharge Instructions (Signed)
Avoid all NSAIDs including naproxen and ibuprofen.  Follow-up with gastroenterology.

## 2017-03-20 ENCOUNTER — Encounter: Payer: Self-pay | Admitting: Physician Assistant

## 2017-03-20 ENCOUNTER — Ambulatory Visit: Payer: Self-pay | Admitting: Physician Assistant

## 2017-03-20 VITALS — BP 124/78 | HR 97 | Temp 98.1°F | Ht 65.0 in | Wt 219.5 lb

## 2017-03-20 DIAGNOSIS — Z1211 Encounter for screening for malignant neoplasm of colon: Secondary | ICD-10-CM

## 2017-03-20 DIAGNOSIS — T39395A Adverse effect of other nonsteroidal anti-inflammatory drugs [NSAID], initial encounter: Secondary | ICD-10-CM

## 2017-03-20 DIAGNOSIS — R1084 Generalized abdominal pain: Secondary | ICD-10-CM

## 2017-03-20 DIAGNOSIS — K2960 Other gastritis without bleeding: Secondary | ICD-10-CM

## 2017-03-20 NOTE — Progress Notes (Signed)
BP 124/78 (BP Location: Left Arm, Patient Position: Sitting, Cuff Size: Normal)   Pulse 97   Temp 98.1 F (36.7 C) (Other (Comment))   Ht 5\' 5"  (1.651 m)   Wt 219 lb 8 oz (99.6 kg)   LMP 02/27/2017 (Exact Date)   SpO2 95%   BMI 36.53 kg/m    Subjective:    Patient ID: Lorraine Peters, female    DOB: 05-26-1970, 47 y.o.   MRN: [id number]  HPI: Lorraine Peters is a 47 y.o. female presenting on 03/20/2017 for Follow-up (from ER)   HPI  Pt states abdominal pain improved since seen in ER on Saturday 03/15/17.  Pt is having regular BMs.   Pt was dx with NSAID induced gastritis- she was taking double the naproxen recommended.   She says she feels full frequently.   Reviewed notes and labs from recent ER visit  Relevant past medical, surgical, family and social history reviewed and updated as indicated. Interim medical history since our last visit reviewed. Allergies and medications reviewed and updated.   Current Outpatient Medications:  .  albuterol (PROVENTIL HFA;VENTOLIN HFA) 108 (90 Base) MCG/ACT inhaler, Inhale 2 puffs into the lungs every 6 (six) hours as needed for wheezing or shortness of breath., Disp: 1 Inhaler, Rfl: 1 .  atorvastatin (LIPITOR) 20 MG tablet, TAKE 1 Tablet BY MOUTH ONCE DAILY, Disp: 90 tablet, Rfl: 2 .  dicyclomine (BENTYL) 20 MG tablet, Take 1 tablet (20 mg total) by mouth 2 (two) times daily as needed for spasms., Disp: 20 tablet, Rfl: 0 .  MAXALT 10 MG tablet, TAKE 1 TABLET BY MOUTH AT ONSET OF HEADACHE, MAY REPEAT IN 2 HOURS. MAX 2 TABLETS IN 24 HOURS., Disp: 15 tablet, Rfl: 0 .  omeprazole (PRILOSEC) 20 MG capsule, TAKE 2 Capsules BY MOUTH ONCE DAILY FOR GERD, Disp: 180 capsule, Rfl: 3 .  sucralfate (CARAFATE) 1 g tablet, Take 1 tablet (1 g total) by mouth 4 (four) times daily -  with meals and at bedtime., Disp: 40 tablet, Rfl: 0   Review of Systems  Constitutional: Positive for appetite change. Negative for chills, diaphoresis, fatigue, fever and unexpected  weight change.  HENT: Negative for congestion, dental problem, drooling, ear pain, facial swelling, hearing loss, mouth sores, sneezing, sore throat, trouble swallowing and voice change.   Eyes: Negative for pain, discharge, redness, itching and visual disturbance.  Respiratory: Negative for cough, choking, shortness of breath and wheezing.   Cardiovascular: Negative for chest pain, palpitations and leg swelling.  Gastrointestinal: Positive for abdominal pain. Negative for blood in stool, constipation, diarrhea and vomiting.  Endocrine: Negative for cold intolerance, heat intolerance and polydipsia.  Genitourinary: Negative for decreased urine volume, dysuria and hematuria.  Musculoskeletal: Negative for arthralgias, back pain and gait problem.  Skin: Negative for rash.  Allergic/Immunologic: Negative for environmental allergies.  Neurological: Negative for seizures, syncope, light-headedness and headaches.  Hematological: Negative for adenopathy.  Psychiatric/Behavioral: Negative for agitation, dysphoric mood and suicidal ideas. The patient is not nervous/anxious.     Per HPI unless specifically indicated above     Objective:    BP 124/78 (BP Location: Left Arm, Patient Position: Sitting, Cuff Size: Normal)   Pulse 97   Temp 98.1 F (36.7 C) (Other (Comment))   Ht 5\' 5"  (1.651 m)   Wt 219 lb 8 oz (99.6 kg)   LMP 02/27/2017 (Exact Date)   SpO2 95%   BMI 36.53 kg/m   Wt Readings from Last 3 Encounters:  03/20/17 219 lb 8 oz (99.6 kg)  03/15/17 217 lb (98.4 kg)  11/28/16 215 lb 4 oz (97.6 kg)    Physical Exam  Constitutional: She is oriented to person, place, and time. She appears well-developed and well-nourished.  HENT:  Head: Normocephalic and atraumatic.  Neck: Neck supple.  Cardiovascular: Normal rate and regular rhythm.  Pulmonary/Chest: Effort normal and breath sounds normal.  Abdominal: Soft. Bowel sounds are normal. She exhibits no distension, no fluid wave, no  ascites and no mass. There is no hepatosplenomegaly. There is generalized tenderness. There is no rigidity, no rebound, no guarding and no CVA tenderness.  Musculoskeletal: She exhibits no edema.  Lymphadenopathy:    She has no cervical adenopathy.  Neurological: She is alert and oriented to person, place, and time.  Skin: Skin is warm and dry.  Psychiatric: She has a normal mood and affect. Her behavior is normal.  Vitals reviewed.   Results for orders placed or performed during the hospital encounter of 03/15/17  Lipase, blood  Result Value Ref Range   Lipase 28 11 - 51 U/L  Comprehensive metabolic panel  Result Value Ref Range   Sodium 139 135 - 145 mmol/L   Potassium 3.6 3.5 - 5.1 mmol/L   Chloride 107 101 - 111 mmol/L   CO2 25 22 - 32 mmol/L   Glucose, Bld 120 (H) 65 - 99 mg/dL   BUN 12 6 - 20 mg/dL   Creatinine, Ser 0.53 0.44 - 1.00 mg/dL   Calcium 9.2 8.9 - 10.3 mg/dL   Total Protein 6.8 6.5 - 8.1 g/dL   Albumin 3.7 3.5 - 5.0 g/dL   AST 16 15 - 41 U/L   ALT 18 14 - 54 U/L   Alkaline Phosphatase 86 38 - 126 U/L   Total Bilirubin 0.1 (L) 0.3 - 1.2 mg/dL   GFR calc non Af Amer >60 >60 mL/min   GFR calc Af Amer >60 >60 mL/min   Anion gap 7 5 - 15  CBC  Result Value Ref Range   WBC 8.6 4.0 - 10.5 K/uL   RBC 4.79 3.87 - 5.11 MIL/uL   Hemoglobin 12.4 12.0 - 15.0 g/dL   HCT 39.4 36.0 - 46.0 %   MCV 82.3 78.0 - 100.0 fL   MCH 25.9 (L) 26.0 - 34.0 pg   MCHC 31.5 30.0 - 36.0 g/dL   RDW 15.7 (H) 11.5 - 15.5 %   Platelets 288 150 - 400 K/uL  Urinalysis, Routine w reflex microscopic  Result Value Ref Range   Color, Urine YELLOW YELLOW   APPearance HAZY (A) CLEAR   Specific Gravity, Urine 1.020 1.005 - 1.030   pH 5.0 5.0 - 8.0   Glucose, UA NEGATIVE NEGATIVE mg/dL   Hgb urine dipstick NEGATIVE NEGATIVE   Bilirubin Urine NEGATIVE NEGATIVE   Ketones, ur NEGATIVE NEGATIVE mg/dL   Protein, ur NEGATIVE NEGATIVE mg/dL   Nitrite NEGATIVE NEGATIVE   Leukocytes, UA NEGATIVE  NEGATIVE  POC Urine Pregnancy, ED (do NOT order at [facility])  Result Value Ref Range   Preg Test, Ur NEGATIVE NEGATIVE      Assessment & Plan:   Encounter Diagnoses  Name Primary?  . Generalized abdominal pain Yes  . NSAID induced gastritis   . Screening for colon cancer     -pt to continue to avoid NSAIDS -pt to continue meds given in ER -iFOBT given for pt to return to office -follow up 1 month  For recheck.  Pt to RTO  sooner for worsening or new symptoms

## 2017-03-24 ENCOUNTER — Other Ambulatory Visit: Payer: Self-pay | Admitting: Physician Assistant

## 2017-03-24 DIAGNOSIS — K2960 Other gastritis without bleeding: Secondary | ICD-10-CM

## 2017-03-24 DIAGNOSIS — Z1211 Encounter for screening for malignant neoplasm of colon: Secondary | ICD-10-CM

## 2017-03-24 DIAGNOSIS — T39395A Adverse effect of other nonsteroidal anti-inflammatory drugs [NSAID], initial encounter: Principal | ICD-10-CM

## 2017-03-24 DIAGNOSIS — R1084 Generalized abdominal pain: Secondary | ICD-10-CM

## 2017-03-24 LAB — IFOBT (OCCULT BLOOD): IFOBT: NEGATIVE

## 2017-04-01 ENCOUNTER — Ambulatory Visit: Payer: Self-pay | Admitting: Physician Assistant

## 2017-04-06 ENCOUNTER — Other Ambulatory Visit: Payer: Self-pay | Admitting: Physician Assistant

## 2017-04-07 ENCOUNTER — Other Ambulatory Visit: Payer: Self-pay | Admitting: Physician Assistant

## 2017-04-21 ENCOUNTER — Ambulatory Visit: Payer: Self-pay | Admitting: Physician Assistant

## 2017-05-13 ENCOUNTER — Ambulatory Visit: Payer: Self-pay | Admitting: Physician Assistant

## 2017-05-13 ENCOUNTER — Encounter: Payer: Self-pay | Admitting: Physician Assistant

## 2017-05-13 ENCOUNTER — Ambulatory Visit (HOSPITAL_COMMUNITY)
Admission: RE | Admit: 2017-05-13 | Discharge: 2017-05-13 | Disposition: A | Discharge status: Home / Self Care | Payer: Self-pay | Source: Ambulatory Visit | Attending: Physician Assistant | Admitting: Physician Assistant

## 2017-05-13 VITALS — BP 134/75 | HR 95 | Temp 97.0°F | Ht 65.0 in | Wt 216.0 lb

## 2017-05-13 DIAGNOSIS — M25531 Pain in right wrist: Secondary | ICD-10-CM | POA: Insufficient documentation

## 2017-05-13 NOTE — Progress Notes (Signed)
BP 134/75 (BP Location: Left Arm, Patient Position: Sitting, Cuff Size: Large)   Pulse 95   Temp (!) 97 F (36.1 C) (Oral)   Ht 5\' 5"  (1.651 m)   Wt 216 lb (98 kg)   SpO2 97%   BMI 35.94 kg/m    Subjective:    Patient ID: Lorraine Peters, female    DOB: [birth year], 47 y.o.   MRN: [id number]  HPI: [name] is a 47 y.o. female presenting on 05/13/2017 for Wrist Pain (c/o right wrist pain for the last few 2 days, denies injury, has had similar pain in the past and felt it pop several times)   HPI   Chief Complaint  Patient presents with  . Wrist Pain    c/o right wrist pain for the last few 2 days, denies injury, has had similar pain in the past and felt it pop several times    Pt states some history of R wrist pain but it has never been bad like this.  She says she heard and felt it pop 2 or 3 days ago while digging around in her purse.  She states severe pain persistent since that time.  Pt has worked as a Educational psychologist for many years.  She is right hand dominant.   Relevant past medical, surgical, family and social history reviewed and updated as indicated. Interim medical history since our last visit reviewed. Allergies and medications reviewed and updated.   Current Outpatient Medications:  .  acetaminophen (TYLENOL) 325 MG tablet, Take 325 mg by mouth once., Disp: , Rfl:  .  albuterol (PROVENTIL HFA;VENTOLIN HFA) 108 (90 Base) MCG/ACT inhaler, Inhale 2 puffs into the lungs every 6 (six) hours as needed for wheezing or shortness of breath., Disp: 1 Inhaler, Rfl: 1 .  atorvastatin (LIPITOR) 20 MG tablet, TAKE 1 Tablet BY MOUTH ONCE DAILY, Disp: 90 tablet, Rfl: 2 .  MAXALT 10 MG tablet, TAKE 1 TABLET BY MOUTH AT ONSET OF HEADACHE, MAY REPEAT IN 2 HOURS. MAX 2 TABLETS IN 24 HOURS., Disp: 15 tablet, Rfl: 0 .  omeprazole (PRILOSEC) 20 MG capsule, TAKE 2 Capsules BY MOUTH ONCE DAILY FOR GERD, Disp: 180 capsule, Rfl: 3 .  sucralfate (CARAFATE) 1 g tablet, Take 1 tablet (1 g total) by  mouth 4 (four) times daily -  with meals and at bedtime., Disp: 40 tablet, Rfl: 0 .  dicyclomine (BENTYL) 20 MG tablet, Take 1 tablet (20 mg total) by mouth 2 (two) times daily as needed for spasms., Disp: 20 tablet, Rfl: 0   Review of Systems  Constitutional: Negative for appetite change, chills, diaphoresis, fatigue, fever and unexpected weight change.  HENT: Negative for congestion, dental problem, drooling, ear pain, facial swelling, hearing loss, mouth sores, sneezing, sore throat, trouble swallowing and voice change.   Eyes: Negative for pain, discharge, redness, itching and visual disturbance.  Respiratory: Negative for cough, choking, shortness of breath and wheezing.   Cardiovascular: Negative for chest pain, palpitations and leg swelling.  Gastrointestinal: Negative for abdominal pain, blood in stool, constipation, diarrhea and vomiting.  Endocrine: Negative for cold intolerance, heat intolerance and polydipsia.  Genitourinary: Negative for decreased urine volume, dysuria and hematuria.  Musculoskeletal: Positive for arthralgias. Negative for back pain and gait problem.  Skin: Negative for rash.  Allergic/Immunologic: Negative for environmental allergies.  Neurological: Negative for seizures, syncope, light-headedness and headaches.  Hematological: Negative for adenopathy.  Psychiatric/Behavioral: Negative for agitation, dysphoric mood and suicidal ideas. The patient is not nervous/anxious.  Per HPI unless specifically indicated above     Objective:    BP 134/75 (BP Location: Left Arm, Patient Position: Sitting, Cuff Size: Large)   Pulse 95   Temp (!) 97 F (36.1 C) (Oral)   Ht 5\' 5"  (1.651 m)   Wt 216 lb (98 kg)   SpO2 97%   BMI 35.94 kg/m   Wt Readings from Last 3 Encounters:  05/13/17 216 lb (98 kg)  03/20/17 219 lb 8 oz (99.6 kg)  03/15/17 217 lb (98.4 kg)     PE: NAD. A&O.  Skin w&d Head normocephalic and atraumatic. No respiratory distress. Mood and  affect normal Gait normal R wrist with normal radial pulse.  There is swelling volar surface over distal radius.  Swelled area very localized.   No bruising.  Hand without swelling.  Pt moves fingers normally with increased pain of the wrist area.  The thumb has decreased movement.   Elbow exam normal      Assessment & Plan:   Encounter Diagnosis  Name Primary?  . Right wrist pain Yes     -pt will go for xray when leaves office today -encouraged pt to use velcro wrist splint and elevate the wrist -she is to ice the area 10-20 minutes several times daily -discussed with pt concern for torn tendon or ligament which will require MRI and orthopedic referral.  Will enter those orders if xray shows nothing.  Pt in agreement -pt is given [facility]

## 2017-05-15 ENCOUNTER — Other Ambulatory Visit: Payer: Self-pay | Admitting: Physician Assistant

## 2017-05-15 DIAGNOSIS — M25531 Pain in right wrist: Secondary | ICD-10-CM

## 2017-05-15 DIAGNOSIS — R9389 Abnormal findings on diagnostic imaging of other specified body structures: Secondary | ICD-10-CM

## 2017-05-20 ENCOUNTER — Other Ambulatory Visit: Payer: Self-pay | Admitting: Physician Assistant

## 2017-06-02 ENCOUNTER — Encounter (INDEPENDENT_AMBULATORY_CARE_PROVIDER_SITE_OTHER): Payer: Self-pay | Admitting: Orthopaedic Surgery

## 2017-06-02 ENCOUNTER — Ambulatory Visit (INDEPENDENT_AMBULATORY_CARE_PROVIDER_SITE_OTHER): Payer: Self-pay | Admitting: Orthopaedic Surgery

## 2017-06-02 DIAGNOSIS — M25531 Pain in right wrist: Secondary | ICD-10-CM | POA: Insufficient documentation

## 2017-06-02 NOTE — Progress Notes (Signed)
Office Visit Note   Patient: Lorraine Peters           Date of Birth: 1971/01/06           MRN: [id number] Visit Date: 06/02/2017              Requested by: Soyla Dryer, PA-C [address] [place], [place] [address] PCP: Soyla Dryer, PA-C   Assessment & Plan: Visit Diagnoses:  1. Pain in right wrist     Plan: Impression is a probable partial tear to the flexor carpi radialis at the scaphoid.  At this point, we will have Lorraine Peters continue wearing her wrist splint until the pain subsides.  She will then DC the wrist splint and increase activity as tolerated.  She will follow-up with [place] on an as-needed basis.  She will call with concerns or questions in the meantime.  Follow-Up Instructions: Return if symptoms worsen or fail to improve.   Orders:  No orders of the defined types were placed in this encounter.  No orders of the defined types were placed in this encounter.     Procedures: No procedures performed   Clinical Data: No additional findings.   Subjective: Chief Complaint  Patient presents with  . Right Wrist - Pain, Injury, Edema    HPI Lorraine Peters is a pleasant 47 year old right-hand-dominant female who presents to our clinic today with right wrist pain.  Approximately 2 weeks ago she was helping pull her mother up in her bed when she noticed increased pain to the volar aspect of her wrist.  This got a little better over the next few days but then she felt an immediate pop and pain to the same place when digging around in her pocket but a few days later.  She was seen by her primary care doctor where x-rays were obtained.  These showed a small old avulsion or calcification to the ligament at the scaphoid.  She was placed in a wrist splint.  She has been doing well and her pain is significantly improved over the past few days.  Her motion and strength is dramatically improved.  No numbness tingling burning.  She is no longer taking over-the-counter medication for  this.  Review of Systems as detailed in HPI.  All others reviewed and are negative.   Objective: Vital Signs: LMP 05/13/2017   Physical Exam well-developed well-nourished female in no acute distress.  Alert and oriented x3.  Ortho Exam examination of her right wrist reveals moderate tenderness to deep palpation over the flexor carpi radialis at the scaphoid.  Full range of motion and strength of all 5 fingers.  She does have increased pain with wrist flexion.  She is neurovascular intact distally.  Specialty Comments:  No specialty comments available.  Imaging: No new imaging today   PMFS History: Patient Active Problem List   Diagnosis Date Noted  . Pain in right wrist 06/02/2017  . Pelvic pain in female 03/22/2015  . History of ovarian cyst 03/22/2015  . Abnormal vaginal bleeding 03/22/2015  . Hyperlipidemia 02/21/2015  . Esophageal reflux 02/21/2015  . Cigarette nicotine dependence, uncomplicated 01/05/2535  . Obesity, unspecified 02/21/2015  . Excessive bleeding in premenopausal period 02/21/2015   Past Medical History:  Diagnosis Date  . Hypercholesterolemia   . Kidney stones   . Ovarian cyst   . Reflux     Family History  Problem Relation Age of Onset  . Arthritis Mother   . Cancer Mother  breast  . Breast cancer Mother 84  . COPD Father   . Heart disease Father   . Hyperlipidemia Father   . Hypertension Father   . Cancer Sister        anal  . Stroke Maternal Grandfather   . Cancer Maternal Aunt        breast  . Breast cancer Maternal Aunt   . Cancer Maternal Uncle        breast  . Breast cancer Maternal Uncle     Past Surgical History:  Procedure Laterality Date  . TUBAL LIGATION     Social History   Occupational History  . Not on file  Tobacco Use  . Smoking status: Current Every Day Smoker    Packs/day: 0.50    Years: 28.00    Pack years: 14.00    Types: Cigarettes  . Smokeless tobacco: Never Used  Substance and Sexual Activity   . Alcohol use: No    Alcohol/week: 0.0 oz  . Drug use: No  . Sexual activity: Yes    Birth control/protection: Surgical

## 2017-06-03 ENCOUNTER — Other Ambulatory Visit: Payer: Self-pay | Admitting: Physician Assistant

## 2017-06-03 DIAGNOSIS — E785 Hyperlipidemia, unspecified: Secondary | ICD-10-CM

## 2017-06-11 ENCOUNTER — Other Ambulatory Visit (HOSPITAL_COMMUNITY)
Admission: RE | Admit: 2017-06-11 | Discharge: 2017-06-11 | Disposition: A | Discharge status: Home / Self Care | Payer: Self-pay | Source: Ambulatory Visit | Attending: Physician Assistant | Admitting: Physician Assistant

## 2017-06-11 DIAGNOSIS — E785 Hyperlipidemia, unspecified: Secondary | ICD-10-CM | POA: Insufficient documentation

## 2017-06-11 LAB — COMPREHENSIVE METABOLIC PANEL
ALBUMIN: 3.4 g/dL — AB (ref 3.5–5.0)
ALT: 19 U/L (ref 14–54)
AST: 14 U/L — ABNORMAL LOW (ref 15–41)
Alkaline Phosphatase: 76 U/L (ref 38–126)
Anion gap: 10 (ref 5–15)
BUN: 17 mg/dL (ref 6–20)
CHLORIDE: 104 mmol/L (ref 101–111)
CO2: 24 mmol/L (ref 22–32)
CREATININE: 0.59 mg/dL (ref 0.44–1.00)
Calcium: 9 mg/dL (ref 8.9–10.3)
GFR calc Af Amer: >60 mL/min (ref >60)
GFR calc non Af Amer: >60 mL/min (ref >60)
Glucose, Bld: 117 mg/dL — ABNORMAL HIGH (ref 65–99)
POTASSIUM: 4.1 mmol/L (ref 3.5–5.1)
SODIUM: 138 mmol/L (ref 135–145)
Total Bilirubin: 0.5 mg/dL (ref 0.3–1.2)
Total Protein: 6.6 g/dL (ref 6.5–8.1)

## 2017-06-11 LAB — LIPID PANEL
Cholesterol: 134 mg/dL (ref 0–200)
HDL: 39 mg/dL — ABNORMAL LOW (ref >40)
LDL CALC: 79 mg/dL (ref 0–99)
Total CHOL/HDL Ratio: 3.4 RATIO
Triglycerides: 78 mg/dL (ref <150)
VLDL: 16 mg/dL (ref 0–40)

## 2017-06-16 ENCOUNTER — Encounter: Payer: Self-pay | Admitting: Physician Assistant

## 2017-06-16 ENCOUNTER — Ambulatory Visit: Payer: Self-pay | Admitting: Physician Assistant

## 2017-06-16 VITALS — BP 128/80 | HR 110 | Temp 97.9°F | Ht 65.0 in | Wt 221.2 lb

## 2017-06-16 DIAGNOSIS — J449 Chronic obstructive pulmonary disease, unspecified: Secondary | ICD-10-CM

## 2017-06-16 DIAGNOSIS — E669 Obesity, unspecified: Secondary | ICD-10-CM

## 2017-06-16 DIAGNOSIS — K2960 Other gastritis without bleeding: Secondary | ICD-10-CM

## 2017-06-16 DIAGNOSIS — E785 Hyperlipidemia, unspecified: Secondary | ICD-10-CM

## 2017-06-16 DIAGNOSIS — F17219 Nicotine dependence, cigarettes, with unspecified nicotine-induced disorders: Secondary | ICD-10-CM

## 2017-06-16 DIAGNOSIS — T39395A Adverse effect of other nonsteroidal anti-inflammatory drugs [NSAID], initial encounter: Principal | ICD-10-CM

## 2017-06-16 NOTE — Progress Notes (Signed)
BP 128/80 (BP Location: Left Arm, Patient Position: Sitting, Cuff Size: Normal)   Pulse (!) 110   Temp 97.9 F (36.6 C)   Ht 5\' 5"  (1.651 m)   Wt 221 lb 4 oz (100.4 kg)   SpO2 96%   BMI 36.82 kg/m    Subjective:    Patient ID: Lorraine Peters, female    DOB: October 11, 1970, 47 y.o.   MRN: [id number]  HPI: Lorraine Peters is a 47 y.o. female presenting on 06/16/2017 for Abdominal Pain and Hyperlipidemia   HPI   Chief Complaint  Patient presents with  . Abdominal Pain  . Hyperlipidemia   Pt says her stomach is so-so.   She is taking advil.  She didn't realize that was bad for her stomach like the aleve was,  but she needed something since she stopped the aleve.   Relevant past medical, surgical, family and social history reviewed and updated as indicated. Interim medical history since our last visit reviewed. Allergies and medications reviewed and updated.   Current Outpatient Medications:  .  acetaminophen (TYLENOL) 325 MG tablet, Take 325 mg by mouth once., Disp: , Rfl:  .  albuterol (PROVENTIL HFA;VENTOLIN HFA) 108 (90 Base) MCG/ACT inhaler, Inhale 2 puffs into the lungs every 6 (six) hours as needed for wheezing or shortness of breath., Disp: 1 Inhaler, Rfl: 1 .  atorvastatin (LIPITOR) 20 MG tablet, TAKE 1 Tablet BY MOUTH ONCE DAILY, Disp: 90 tablet, Rfl: 2 .  dicyclomine (BENTYL) 20 MG tablet, Take 1 tablet (20 mg total) by mouth 2 (two) times daily as needed for spasms., Disp: 20 tablet, Rfl: 0 .  ibuprofen (ADVIL,MOTRIN) 200 MG tablet, Take 200 mg by mouth every 6 (six) hours as needed., Disp: , Rfl:  .  omeprazole (PRILOSEC) 20 MG capsule, TAKE 2 Capsules BY MOUTH ONCE DAILY FOR GERD, Disp: 180 capsule, Rfl: 3 .  rizatriptan (MAXALT) 10 MG tablet, TAKE 1 TABLET BY MOUTH AT ONSET OF HEADACHE, MAY REPEAT IN 2 HOURS. MAX 2 TABLETS IN 24 HOURS., Disp: 15 tablet, Rfl: 0 .  sucralfate (CARAFATE) 1 g tablet, Take 1 tablet (1 g total) by mouth 4 (four) times daily -  with meals and at  bedtime. (Patient taking differently: Take 1 g by mouth 3 times/day as needed-between meals & bedtime. ), Disp: 40 tablet, Rfl: 0  Review of Systems  Constitutional: Negative for appetite change, chills, diaphoresis, fatigue, fever and unexpected weight change.  HENT: Positive for sneezing. Negative for congestion, dental problem, drooling, ear pain, facial swelling, hearing loss, mouth sores, sore throat, trouble swallowing and voice change.   Eyes: Negative for pain, discharge, redness, itching and visual disturbance.  Respiratory: Positive for cough. Negative for choking, shortness of breath and wheezing.   Cardiovascular: Negative for chest pain, palpitations and leg swelling.  Gastrointestinal: Negative for abdominal pain, blood in stool, constipation, diarrhea and vomiting.  Endocrine: Negative for cold intolerance, heat intolerance and polydipsia.  Genitourinary: Negative for decreased urine volume, dysuria and hematuria.  Musculoskeletal: Negative for arthralgias, back pain and gait problem.  Skin: Negative for rash.  Allergic/Immunologic: Negative for environmental allergies.  Neurological: Positive for headaches. Negative for seizures, syncope and light-headedness.  Hematological: Negative for adenopathy.  Psychiatric/Behavioral: Negative for agitation, dysphoric mood and suicidal ideas. The patient is not nervous/anxious.     Per HPI unless specifically indicated above     Objective:    BP 128/80 (BP Location: Left Arm, Patient Position: Sitting, Cuff Size: Normal)  Pulse (!) 110   Temp 97.9 F (36.6 C)   Ht 5\' 5"  (1.651 m)   Wt 221 lb 4 oz (100.4 kg)   SpO2 96%   BMI 36.82 kg/m   Wt Readings from Last 3 Encounters:  06/16/17 221 lb 4 oz (100.4 kg)  05/13/17 216 lb (98 kg)  03/20/17 219 lb 8 oz (99.6 kg)    Physical Exam  Constitutional: She is oriented to person, place, and time. She appears well-developed and well-nourished.  HENT:  Head: Normocephalic and  atraumatic.  Neck: Neck supple.  Cardiovascular: Normal rate and regular rhythm.  Pulmonary/Chest: Effort normal and breath sounds normal.  Abdominal: Soft. Bowel sounds are normal. She exhibits no abdominal bruit and no mass. There is no hepatosplenomegaly. There is tenderness in the epigastric area. There is no rigidity, no rebound and no guarding.  Musculoskeletal: She exhibits no edema.  Lymphadenopathy:    She has no cervical adenopathy.  Neurological: She is alert and oriented to person, place, and time.  Skin: Skin is warm and dry.  Psychiatric: She has a normal mood and affect. Her behavior is normal.  Vitals reviewed.   Results for orders placed or performed during the hospital encounter of 06/11/17  Lipid panel  Result Value Ref Range   Cholesterol 134 0 - 200 mg/dL   Triglycerides 78 <150 mg/dL   HDL 39 (L) >40 mg/dL   Total CHOL/HDL Ratio 3.4 RATIO   VLDL 16 0 - 40 mg/dL   LDL Cholesterol 79 0 - 99 mg/dL  Comprehensive metabolic panel  Result Value Ref Range   Sodium 138 135 - 145 mmol/L   Potassium 4.1 3.5 - 5.1 mmol/L   Chloride 104 101 - 111 mmol/L   CO2 24 22 - 32 mmol/L   Glucose, Bld 117 (H) 65 - 99 mg/dL   BUN 17 6 - 20 mg/dL   Creatinine, Ser 0.59 0.44 - 1.00 mg/dL   Calcium 9.0 8.9 - 10.3 mg/dL   Total Protein 6.6 6.5 - 8.1 g/dL   Albumin 3.4 (L) 3.5 - 5.0 g/dL   AST 14 (L) 15 - 41 U/L   ALT 19 14 - 54 U/L   Alkaline Phosphatase 76 38 - 126 U/L   Total Bilirubin 0.5 0.3 - 1.2 mg/dL   GFR calc non Af Amer >60 >60 mL/min   GFR calc Af Amer >60 >60 mL/min   Anion gap 10 5 - 15      Assessment & Plan:   Encounter Diagnoses  Name Primary?  . NSAID induced gastritis Yes  . Hyperlipidemia, unspecified hyperlipidemia type   . Cigarette nicotine dependence with nicotine-induced disorder   . Chronic obstructive pulmonary disease, unspecified COPD type ([place])   . Obesity, unspecified classification, unspecified obesity type, unspecified whether serious  comorbidity present     -reviewed labs with pt -pt counseled to Stop ALL NSAIDS.  Continue omeprazole.  Counseled pt on smoking cessation.  Also encouraged pt to avoid peppermints.  -pt to continue current medications -follow up 1 month to recheck gastritis.  RTO sooner prn

## 2017-06-30 ENCOUNTER — Other Ambulatory Visit: Payer: Self-pay | Admitting: Physician Assistant

## 2017-07-17 ENCOUNTER — Encounter: Payer: Self-pay | Admitting: Physician Assistant

## 2017-07-17 ENCOUNTER — Ambulatory Visit: Payer: Self-pay | Admitting: Physician Assistant

## 2017-07-17 VITALS — BP 114/64 | HR 91 | Temp 98.1°F | Ht 65.0 in | Wt 221.0 lb

## 2017-07-17 DIAGNOSIS — J449 Chronic obstructive pulmonary disease, unspecified: Secondary | ICD-10-CM

## 2017-07-17 DIAGNOSIS — T39395A Adverse effect of other nonsteroidal anti-inflammatory drugs [NSAID], initial encounter: Principal | ICD-10-CM

## 2017-07-17 DIAGNOSIS — F17219 Nicotine dependence, cigarettes, with unspecified nicotine-induced disorders: Secondary | ICD-10-CM

## 2017-07-17 DIAGNOSIS — E785 Hyperlipidemia, unspecified: Secondary | ICD-10-CM

## 2017-07-17 DIAGNOSIS — E669 Obesity, unspecified: Secondary | ICD-10-CM

## 2017-07-17 DIAGNOSIS — K2960 Other gastritis without bleeding: Secondary | ICD-10-CM

## 2017-07-17 DIAGNOSIS — R1013 Epigastric pain: Secondary | ICD-10-CM
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
## 2017-07-30 ENCOUNTER — Encounter: Payer: Self-pay | Admitting: Gastroenterology

## 2017-09-01 ENCOUNTER — Other Ambulatory Visit: Payer: Self-pay

## 2017-09-01 ENCOUNTER — Encounter: Payer: Self-pay | Admitting: Gastroenterology

## 2017-09-01 ENCOUNTER — Ambulatory Visit (INDEPENDENT_AMBULATORY_CARE_PROVIDER_SITE_OTHER): Payer: Self-pay | Admitting: Gastroenterology

## 2017-09-01 VITALS — BP 135/83 | HR 83 | Temp 97.6°F | Ht 66.0 in | Wt 226.6 lb

## 2017-09-01 DIAGNOSIS — K219 Gastro-esophageal reflux disease without esophagitis: Secondary | ICD-10-CM

## 2017-09-01 DIAGNOSIS — Z8489 Family history of other specified conditions: Secondary | ICD-10-CM | POA: Insufficient documentation

## 2017-09-01 DIAGNOSIS — R110 Nausea: Secondary | ICD-10-CM | POA: Insufficient documentation

## 2017-09-01 DIAGNOSIS — R1013 Epigastric pain: Secondary | ICD-10-CM

## 2017-09-01 HISTORY — DX: Family history of other specified conditions: Z84.89

## 2017-09-01 NOTE — Assessment & Plan Note (Signed)
Patient reports that her sister was diagnosed with anal cancer in her 3s.  Further details not available.  Her sister apparently had advanced disease but subsequently died from a MVA.  Patient has never had a colonoscopy.  Consider colonoscopy later this year once her current symptoms are addressed.

## 2017-09-01 NOTE — Patient Instructions (Addendum)
1. Continue omeprazole once daily, trying to take on empty stomach followed by food 30-60 minutes later.  2. Upper endoscopy as scheduled. See separate instructions.  3. Will consider colonoscopy later this year given family history of anal cancer.

## 2017-09-01 NOTE — Progress Notes (Signed)
Primary Care Physician:  Soyla Dryer, PA-C  Primary Gastroenterologist:  Garfield Cornea, MD   Chief Complaint  Patient presents with  . Abdominal Pain    comes/goes, "occas flare", tenderness/soreness upper abd  . Nausea    w/ abd pain at times    HPI:  Lorraine Peters is a 47 y.o. female here as a new patient referral at the request of Soyla Dryer, PA-C for further evaluation of abdominal pain and nausea.  She was seen in the ED back in January 2019.  She had been on naproxen for several weeks for sciatic related pain.  Also h/o of regular naprosyn use for plantar fasciitis.  Notes that she was also taking a lot of "sinus pills" for sinusitis at the time.  Developed upper abdominal pain.  Plain abdominal films unremarkable.  Abdominal pain initially improved with GI cocktail.  She was felt to have NSAID induced gastritis.  Asked to avoid all NSAIDs and continue PPI.  Started carafate. Labs including CBC, CMET, lipase unremarkable while in ED in 03/2017. Heme negative stool.   She has been on PPI for years.  Currently receiving through [facility] and states that sometimes her medication gets changed depending once available. She reports being on omeprazole 20 mg twice daily but eventually was switched to 40 mg daily which worked better for her.  Her reflux is fairly well controlled with no heartburn.  Continues to have frequent nausea and epigastric pain with palpation. BM regular. No melena, brbpr. No heartburn as long as takes PPI. Stops eating after six PM. Has to go to bed earlier due to schedule. No dysphagia. No prior colonoscopy.  H/O episodes of vomiting bile from early childhood until graduated high school. Would be hospitalized at least once per year.  Eventually "grew out of it".  Describes being told that her small intestines were dilated.  Sister diagnosed with anal cancer in her 69s, had advanced disease which would have probably killed her up her patient.  She subsequently died  in MVA however.  Current Outpatient Medications  Medication Sig Dispense Refill  . acetaminophen (TYLENOL) 325 MG tablet Take 325 mg by mouth every 6 (six) hours as needed.     Marland Kitchen albuterol (PROVENTIL HFA;VENTOLIN HFA) 108 (90 Base) MCG/ACT inhaler Inhale 2 puffs into the lungs every 6 (six) hours as needed for wheezing or shortness of breath. 1 Inhaler 1  . atorvastatin (LIPITOR) 20 MG tablet TAKE 1 Tablet BY MOUTH ONCE DAILY 90 tablet 2  . dicyclomine (BENTYL) 20 MG tablet Take 1 tablet (20 mg total) by mouth 2 (two) times daily as needed for spasms. 20 tablet 0  . MAXALT 10 MG tablet TAKE 1 TABLET BY MOUTH AT ONSET OF HEADACHE, MAY REPEAT IN 2 HOURS. MAX 2 TABLETS IN 24 HOURS. 15 tablet 0  . omeprazole (PRILOSEC) 20 MG capsule TAKE 2 Capsules BY MOUTH ONCE DAILY FOR GERD 180 capsule 3  . sucralfate (CARAFATE) 1 g tablet Take 1 tablet (1 g total) by mouth 4 (four) times daily -  with meals and at bedtime. (Patient taking differently: Take 1 g by mouth 3 times/day as needed-between meals & bedtime. ) 40 tablet 0   No current facility-administered medications for this visit.     Allergies as of 09/01/2017 - Review Complete 09/01/2017  Allergen Reaction Noted  . Bee venom Swelling 04/29/2011    Past Medical History:  Diagnosis Date  . Hypercholesterolemia   . Kidney stones   . Migraine   .  Ovarian cyst   . Reflux     Past Surgical History:  Procedure Laterality Date  . TUBAL LIGATION      Family History  Problem Relation Age of Onset  . Arthritis Mother   . Cancer Mother        breast  . Breast cancer Mother 45  . COPD Father   . Heart disease Father   . Hyperlipidemia Father   . Hypertension Father   . Cancer Sister 55       anal, deceased due to mva but had advanced disease  . Stroke Maternal Grandfather   . Cancer Maternal Aunt        breast  . Breast cancer Maternal Aunt   . Cancer Maternal Uncle        breast  . Breast cancer Maternal Uncle     Social History    Socioeconomic History  . Marital status: Divorced    Spouse name: Not on file  . Number of children: Not on file  . Years of education: Not on file  . Highest education level: Not on file  Occupational History  . Not on file  Social Needs  . Financial resource strain: Not on file  . Food insecurity:    Worry: Not on file    Inability: Not on file  . Transportation needs:    Medical: Not on file    Non-medical: Not on file  Tobacco Use  . Smoking status: Current Every Day Smoker    Packs/day: 0.50    Years: 28.00    Pack years: 14.00    Types: Cigarettes  . Smokeless tobacco: Never Used  Substance and Sexual Activity  . Alcohol use: No    Alcohol/week: 0.0 oz  . Drug use: No  . Sexual activity: Yes    Birth control/protection: Surgical  Lifestyle  . Physical activity:    Days per week: Not on file    Minutes per session: Not on file  . Stress: Not on file  Relationships  . Social connections:    Talks on phone: Not on file    Gets together: Not on file    Attends religious service: Not on file    Active member of club or organization: Not on file    Attends meetings of clubs or organizations: Not on file    Relationship status: Not on file  . Intimate partner violence:    Fear of current or ex partner: Not on file    Emotionally abused: Not on file    Physically abused: Not on file    Forced sexual activity: Not on file  Other Topics Concern  . Not on file  Social History Narrative  . Not on file      ROS:  General: Negative for anorexia, weight loss, fever, chills, fatigue, weakness. Eyes: Negative for vision changes.  ENT: Negative for hoarseness, difficulty swallowing , nasal congestion. CV: Negative for chest pain, angina, palpitations, dyspnea on exertion, peripheral edema.  Respiratory: Negative for dyspnea at rest, dyspnea on exertion, cough, sputum, wheezing.  GI: See history of present illness. GU:  Negative for dysuria, hematuria, urinary  incontinence, urinary frequency, nocturnal urination.  MS: Negative for joint pain, low back pain.  Plantar fasciitis with intermittent flares Derm: Negative for rash or itching.  Neuro: Negative for weakness, abnormal sensation, seizure, frequent headaches, memory loss, confusion.  Psych: Negative for anxiety, depression, suicidal ideation, hallucinations.  Endo: Negative for unusual weight change.  Heme: Negative  for bruising or bleeding. Allergy: Negative for rash or hives.    Physical Examination:  BP 135/83   Pulse 83   Temp 97.6 F (36.4 C) (Oral)   Ht 5\' 6"  (1.676 m)   Wt 226 lb 9.6 oz (102.8 kg)   LMP 08/28/2017 (Approximate)   BMI 36.57 kg/m    General: Well-nourished, well-developed in no acute distress.  Head: Normocephalic, atraumatic.   Eyes: Conjunctiva pink, no icterus. Mouth: Oropharyngeal mucosa moist and pink , no lesions erythema or exudate. Neck: Supple without thyromegaly, masses, or lymphadenopathy.  Lungs: Clear to auscultation bilaterally.  Heart: Regular rate and rhythm, no murmurs rubs or gallops.  Abdomen: Bowel sounds are normal, mild rectus diastases, mild to moderate epigastric tenderness, nondistended, no hepatosplenomegaly or masses, no abdominal bruits or    hernia , no rebound or guarding.   Rectal: Not performed Extremities: No lower extremity edema. No clubbing or deformities.  Neuro: Alert and oriented x 4 , grossly normal neurologically.  Skin: Warm and dry, no rash or jaundice.   Psych: Alert and cooperative, normal mood and affect.  Labs: Lab Results  Component Value Date   CREATININE 0.59 06/11/2017   BUN 17 06/11/2017   NA 138 06/11/2017   K 4.1 06/11/2017   CL 104 06/11/2017   CO2 24 06/11/2017   Lab Results  Component Value Date   ALT 19 06/11/2017   AST 14 (L) 06/11/2017   ALKPHOS 76 06/11/2017   BILITOT 0.5 06/11/2017   Lab Results  Component Value Date   WBC 8.6 03/15/2017   HGB 12.4 03/15/2017   HCT 39.4  03/15/2017   MCV 82.3 03/15/2017   PLT 288 03/15/2017     Imaging Studies: No results found.

## 2017-09-01 NOTE — Progress Notes (Signed)
cc'ed to pcp °

## 2017-09-01 NOTE — H&P (View-Only) (Signed)
Primary Care Physician:  Soyla Dryer, PA-C  Primary Gastroenterologist:  Garfield Cornea, MD   Chief Complaint  Patient presents with  . Abdominal Pain    comes/goes, "occas flare", tenderness/soreness upper abd  . Nausea    w/ abd pain at times    HPI:  Lorraine Peters is a 47 y.o. female here as a new patient referral at the request of Soyla Dryer, PA-C for further evaluation of abdominal pain and nausea.  She was seen in the ED back in January 2019.  She had been on naproxen for several weeks for sciatic related pain.  Also h/o of regular naprosyn use for plantar fasciitis.  Notes that she was also taking a lot of "sinus pills" for sinusitis at the time.  Developed upper abdominal pain.  Plain abdominal films unremarkable.  Abdominal pain initially improved with GI cocktail.  She was felt to have NSAID induced gastritis.  Asked to avoid all NSAIDs and continue PPI.  Started carafate. Labs including CBC, CMET, lipase unremarkable while in ED in 03/2017. Heme negative stool.   She has been on PPI for years.  Currently receiving through [facility] and states that sometimes her medication gets changed depending once available. She reports being on omeprazole 20 mg twice daily but eventually was switched to 40 mg daily which worked better for her.  Her reflux is fairly well controlled with no heartburn.  Continues to have frequent nausea and epigastric pain with palpation. BM regular. No melena, brbpr. No heartburn as long as takes PPI. Stops eating after six PM. Has to go to bed earlier due to schedule. No dysphagia. No prior colonoscopy.  H/O episodes of vomiting bile from early childhood until graduated high school. Would be hospitalized at least once per year.  Eventually "grew out of it".  Describes being told that her small intestines were dilated.  Sister diagnosed with anal cancer in her 40s, had advanced disease which would have probably killed her up her patient.  She subsequently died  in MVA however.  Current Outpatient Medications  Medication Sig Dispense Refill  . acetaminophen (TYLENOL) 325 MG tablet Take 325 mg by mouth every 6 (six) hours as needed.     Marland Kitchen albuterol (PROVENTIL HFA;VENTOLIN HFA) 108 (90 Base) MCG/ACT inhaler Inhale 2 puffs into the lungs every 6 (six) hours as needed for wheezing or shortness of breath. 1 Inhaler 1  . atorvastatin (LIPITOR) 20 MG tablet TAKE 1 Tablet BY MOUTH ONCE DAILY 90 tablet 2  . dicyclomine (BENTYL) 20 MG tablet Take 1 tablet (20 mg total) by mouth 2 (two) times daily as needed for spasms. 20 tablet 0  . MAXALT 10 MG tablet TAKE 1 TABLET BY MOUTH AT ONSET OF HEADACHE, MAY REPEAT IN 2 HOURS. MAX 2 TABLETS IN 24 HOURS. 15 tablet 0  . omeprazole (PRILOSEC) 20 MG capsule TAKE 2 Capsules BY MOUTH ONCE DAILY FOR GERD 180 capsule 3  . sucralfate (CARAFATE) 1 g tablet Take 1 tablet (1 g total) by mouth 4 (four) times daily -  with meals and at bedtime. (Patient taking differently: Take 1 g by mouth 3 times/day as needed-between meals & bedtime. ) 40 tablet 0   No current facility-administered medications for this visit.     Allergies as of 09/01/2017 - Review Complete 09/01/2017  Allergen Reaction Noted  . Bee venom Swelling 04/29/2011    Past Medical History:  Diagnosis Date  . Hypercholesterolemia   . Kidney stones   . Migraine   .  Ovarian cyst   . Reflux     Past Surgical History:  Procedure Laterality Date  . TUBAL LIGATION      Family History  Problem Relation Age of Onset  . Arthritis Mother   . Cancer Mother        breast  . Breast cancer Mother 47  . COPD Father   . Heart disease Father   . Hyperlipidemia Father   . Hypertension Father   . Cancer Sister 8       anal, deceased due to mva but had advanced disease  . Stroke Maternal Grandfather   . Cancer Maternal Aunt        breast  . Breast cancer Maternal Aunt   . Cancer Maternal Uncle        breast  . Breast cancer Maternal Uncle     Social History    Socioeconomic History  . Marital status: Divorced    Spouse name: Not on file  . Number of children: Not on file  . Years of education: Not on file  . Highest education level: Not on file  Occupational History  . Not on file  Social Needs  . Financial resource strain: Not on file  . Food insecurity:    Worry: Not on file    Inability: Not on file  . Transportation needs:    Medical: Not on file    Non-medical: Not on file  Tobacco Use  . Smoking status: Current Every Day Smoker    Packs/day: 0.50    Years: 28.00    Pack years: 14.00    Types: Cigarettes  . Smokeless tobacco: Never Used  Substance and Sexual Activity  . Alcohol use: No    Alcohol/week: 0.0 oz  . Drug use: No  . Sexual activity: Yes    Birth control/protection: Surgical  Lifestyle  . Physical activity:    Days per week: Not on file    Minutes per session: Not on file  . Stress: Not on file  Relationships  . Social connections:    Talks on phone: Not on file    Gets together: Not on file    Attends religious service: Not on file    Active member of club or organization: Not on file    Attends meetings of clubs or organizations: Not on file    Relationship status: Not on file  . Intimate partner violence:    Fear of current or ex partner: Not on file    Emotionally abused: Not on file    Physically abused: Not on file    Forced sexual activity: Not on file  Other Topics Concern  . Not on file  Social History Narrative  . Not on file      ROS:  General: Negative for anorexia, weight loss, fever, chills, fatigue, weakness. Eyes: Negative for vision changes.  ENT: Negative for hoarseness, difficulty swallowing , nasal congestion. CV: Negative for chest pain, angina, palpitations, dyspnea on exertion, peripheral edema.  Respiratory: Negative for dyspnea at rest, dyspnea on exertion, cough, sputum, wheezing.  GI: See history of present illness. GU:  Negative for dysuria, hematuria, urinary  incontinence, urinary frequency, nocturnal urination.  MS: Negative for joint pain, low back pain.  Plantar fasciitis with intermittent flares Derm: Negative for rash or itching.  Neuro: Negative for weakness, abnormal sensation, seizure, frequent headaches, memory loss, confusion.  Psych: Negative for anxiety, depression, suicidal ideation, hallucinations.  Endo: Negative for unusual weight change.  Heme: Negative  for bruising or bleeding. Allergy: Negative for rash or hives.    Physical Examination:  BP 135/83   Pulse 83   Temp 97.6 F (36.4 C) (Oral)   Ht 5\' 6"  (1.676 m)   Wt 226 lb 9.6 oz (102.8 kg)   LMP 08/28/2017 (Approximate)   BMI 36.57 kg/m    General: Well-nourished, well-developed in no acute distress.  Head: Normocephalic, atraumatic.   Eyes: Conjunctiva pink, no icterus. Mouth: Oropharyngeal mucosa moist and pink , no lesions erythema or exudate. Neck: Supple without thyromegaly, masses, or lymphadenopathy.  Lungs: Clear to auscultation bilaterally.  Heart: Regular rate and rhythm, no murmurs rubs or gallops.  Abdomen: Bowel sounds are normal, mild rectus diastases, mild to moderate epigastric tenderness, nondistended, no hepatosplenomegaly or masses, no abdominal bruits or    hernia , no rebound or guarding.   Rectal: Not performed Extremities: No lower extremity edema. No clubbing or deformities.  Neuro: Alert and oriented x 4 , grossly normal neurologically.  Skin: Warm and dry, no rash or jaundice.   Psych: Alert and cooperative, normal mood and affect.  Labs: Lab Results  Component Value Date   CREATININE 0.59 06/11/2017   BUN 17 06/11/2017   NA 138 06/11/2017   K 4.1 06/11/2017   CL 104 06/11/2017   CO2 24 06/11/2017   Lab Results  Component Value Date   ALT 19 06/11/2017   AST 14 (L) 06/11/2017   ALKPHOS 76 06/11/2017   BILITOT 0.5 06/11/2017   Lab Results  Component Value Date   WBC 8.6 03/15/2017   HGB 12.4 03/15/2017   HCT 39.4  03/15/2017   MCV 82.3 03/15/2017   PLT 288 03/15/2017     Imaging Studies: No results found.

## 2017-09-01 NOTE — Assessment & Plan Note (Signed)
47 year old female with chronic GERD on PPI for years, typically well controlled, who presents with several month history of epigastric pain associated with nausea but no vomiting.  She was seen in the ED initially for her symptoms back in January.  It was noted that she had been on Naprosyn regularly as well as "sinus pills" and it was suspected that she had NSAID related gastritis.  Carafate was added to her PPI regimen.  Given persistent symptoms, she was referred for further evaluation.  Patient states her PPI was increased, currently on omeprazole 40 mg daily.  She has [facility] and therefore gets whichever PPIs available to her.    She has moderate epigastric tenderness on exam.  Has been off NSAIDs for 3 months.  Denies dysphagia.  For persistent abdominal pain, would recommend upper endoscopy.  I have discussed the risks, alternatives, benefits with regards to but not limited to the risk of reaction to medication, bleeding, infection, perforation and the patient is agreeable to proceed. Written consent to be obtained.

## 2017-09-12 ENCOUNTER — Ambulatory Visit (HOSPITAL_COMMUNITY)
Admission: RE | Admit: 2017-09-12 | Discharge: 2017-09-12 | Disposition: A | Discharge status: Home / Self Care | Payer: Self-pay | Source: Ambulatory Visit | Attending: Internal Medicine | Admitting: Internal Medicine

## 2017-09-12 ENCOUNTER — Encounter (HOSPITAL_COMMUNITY)
Admission: RE | Disposition: A | Discharge status: Home / Self Care | Payer: Self-pay | Source: Ambulatory Visit | Attending: Internal Medicine

## 2017-09-12 ENCOUNTER — Other Ambulatory Visit: Payer: Self-pay

## 2017-09-12 ENCOUNTER — Telehealth: Payer: Self-pay | Admitting: Gastroenterology

## 2017-09-12 DIAGNOSIS — K449 Diaphragmatic hernia without obstruction or gangrene: Secondary | ICD-10-CM | POA: Insufficient documentation

## 2017-09-12 DIAGNOSIS — R110 Nausea: Secondary | ICD-10-CM

## 2017-09-12 DIAGNOSIS — M722 Plantar fascial fibromatosis: Secondary | ICD-10-CM | POA: Insufficient documentation

## 2017-09-12 DIAGNOSIS — Q858 Other phakomatoses, not elsewhere classified: Secondary | ICD-10-CM | POA: Insufficient documentation

## 2017-09-12 DIAGNOSIS — Z800 Family history of malignant neoplasm of digestive organs: Secondary | ICD-10-CM | POA: Insufficient documentation

## 2017-09-12 DIAGNOSIS — F17210 Nicotine dependence, cigarettes, uncomplicated: Secondary | ICD-10-CM | POA: Insufficient documentation

## 2017-09-12 DIAGNOSIS — E7800 Pure hypercholesterolemia, unspecified: Secondary | ICD-10-CM | POA: Insufficient documentation

## 2017-09-12 DIAGNOSIS — K219 Gastro-esophageal reflux disease without esophagitis: Secondary | ICD-10-CM | POA: Insufficient documentation

## 2017-09-12 DIAGNOSIS — Z79899 Other long term (current) drug therapy: Secondary | ICD-10-CM | POA: Insufficient documentation

## 2017-09-12 DIAGNOSIS — K317 Polyp of stomach and duodenum: Secondary | ICD-10-CM

## 2017-09-12 DIAGNOSIS — R1013 Epigastric pain: Secondary | ICD-10-CM

## 2017-09-12 DIAGNOSIS — Z91030 Bee allergy status: Secondary | ICD-10-CM | POA: Insufficient documentation

## 2017-09-12 DIAGNOSIS — Z791 Long term (current) use of non-steroidal anti-inflammatories (NSAID): Secondary | ICD-10-CM | POA: Insufficient documentation

## 2017-09-12 HISTORY — PX: ESOPHAGOGASTRODUODENOSCOPY: SHX5428

## 2017-09-12 SURGERY — EGD (ESOPHAGOGASTRODUODENOSCOPY)
Anesthesia: Moderate Sedation

## 2017-09-12 MED ORDER — MIDAZOLAM HCL 5 MG/5ML IJ SOLN
INTRAMUSCULAR | Status: DC | PRN
  Administered 2017-09-12: 1 mg via INTRAVENOUS
  Administered 2017-09-12: 2 mg via INTRAVENOUS
  Administered 2017-09-12 (×4): 1 mg via INTRAVENOUS

## 2017-09-12 MED ORDER — MEPERIDINE HCL 100 MG/ML IJ SOLN
INTRAMUSCULAR | Status: DC | PRN
  Administered 2017-09-12: 25 mg via INTRAVENOUS
  Administered 2017-09-12: 50 mg via INTRAVENOUS
  Administered 2017-09-12: 25 mg via INTRAVENOUS

## 2017-09-12 MED ORDER — SIMETHICONE 40 MG/0.6ML PO SUSP
ORAL | Status: DC | PRN
  Administered 2017-09-12: 14:00:00

## 2017-09-12 MED ORDER — MIDAZOLAM HCL 5 MG/5ML IJ SOLN
INTRAMUSCULAR | Status: AC
  Filled 2017-09-12: qty 10

## 2017-09-12 MED ORDER — ONDANSETRON HCL 4 MG/2ML IJ SOLN
INTRAMUSCULAR | Status: AC
  Filled 2017-09-12: qty 2

## 2017-09-12 MED ORDER — MEPERIDINE HCL 100 MG/ML IJ SOLN
INTRAMUSCULAR | Status: AC
  Filled 2017-09-12: qty 2

## 2017-09-12 MED ORDER — LIDOCAINE VISCOUS HCL 2 % MT SOLN
OROMUCOSAL | Status: DC | PRN
  Administered 2017-09-12: 6 mL via OROMUCOSAL

## 2017-09-12 MED ORDER — SODIUM CHLORIDE 0.9 % IV SOLN
INTRAVENOUS | Status: DC
  Administered 2017-09-12: 14:00:00 via INTRAVENOUS

## 2017-09-12 MED ORDER — LIDOCAINE VISCOUS HCL 2 % MT SOLN
OROMUCOSAL | Status: AC
  Filled 2017-09-12: qty 15

## 2017-09-12 MED ORDER — ONDANSETRON HCL 4 MG/2ML IJ SOLN
INTRAMUSCULAR | Status: DC | PRN
  Administered 2017-09-12: 4 mg via INTRAVENOUS

## 2017-09-12 NOTE — Interval H&P Note (Signed)
History and Physical Interval Note:  09/12/2017 1:57 PM  Lorraine Peters  has presented today for surgery, with the diagnosis of epigastric pain, GERD, nausea  The various methods of treatment have been discussed with the patient and family. After consideration of risks, benefits and other options for treatment, the patient has consented to  Procedure(s) with comments: ESOPHAGOGASTRODUODENOSCOPY (EGD) (N/A) - 2:30pm as a surgical intervention .  The patient's history has been reviewed, patient examined, no change in status, stable for surgery.  I have reviewed the patient's chart and labs.  Questions were answered to the patient's satisfaction.     [name]  No dysphagia. EGD to further evaluate her symptoms.No change. The risks, benefits, limitations, alternatives and imponderables have been reviewed with the patient. Potential for esophageal dilation, biopsy, etc. have also been reviewed.  Questions have been answered. All parties agreeable.

## 2017-09-12 NOTE — Op Note (Addendum)
[facility] Patient Name: Lorraine Peters Procedure Date: 09/12/2017 1:49 PM MRN: [id number] Date of Birth: [birth year] Attending MD: Norvel Richards , MD CSN: [id number] Age: 47 Admit Type: Outpatient Procedure:                Upper GI endoscopy Indications:              Dyspepsia Providers:                Norvel Richards, MD, Hinton Rao, RN,                            Randa Spike, Technician Referring MD:              Medicines:                Midazolam 7 mg IV, Meperidine 100 mg IV,                            Ondansetron 4 mg IV Complications:            No immediate complications. Estimated Blood Loss:     Estimated blood loss was minimal. Procedure:                Pre-Anesthesia Assessment:                           - Prior to the procedure, a History and Physical                            was performed, and patient medications and                            allergies were reviewed. The patient's tolerance of                            previous anesthesia was also reviewed. The risks                            and benefits of the procedure and the sedation                            options and risks were discussed with the patient.                            All questions were answered, and informed consent                            was obtained. Prior Anticoagulants: The patient has                            taken no previous anticoagulant or antiplatelet                            agents. ASA Grade Assessment: II - A patient with  mild systemic disease. After reviewing the risks                            and benefits, the patient was deemed in                            satisfactory condition to undergo the procedure.                           After obtaining informed consent, the endoscope was                            passed under direct vision. Throughout the                            procedure, the patient's blood pressure,  pulse, and                            oxygen saturations were monitored continuously. The                            [id number] ([id number]) scope was introduced through the                            mouth, and advanced to the second part of duodenum.                            The upper GI endoscopy was accomplished without                            difficulty. The patient tolerated the procedure                            well. Scope In: 2:11:52 PM Scope Out: 2:35:09 PM Total Procedure Duration: 0 hours 23 minutes 17 seconds  Findings:      The examined esophagus was normal.      A small hiatal hernia was present.      No other significant abnormalities were identified in a careful       examination of the stomach. 1.5 cm pedunculated polyp arising out of the       duodenal bulb - "ball valving" stalk was thick and long, approximate 6       cm. Please see photos. The polyp into the antrum. I placed 2 hemostasis       clips at the base of the polyp. I resect this polyp with 2 passes of a       hot snare cautery. 2 large pieces were recovered. Good hemostasis was       maintained. The remainder of the duodenum to the second portion appeared       normal. Impression:               - Normal esophagus.                           - Small hiatal hernia.                           -  Large pedunculated duodenal polyp. Status post                            snare resection and hemostasis clip placement. It                            remains to be seen if this lesion has anything to                            do with her presenting pain. Moderate Sedation:      Moderate (conscious) sedation was administered by the endoscopy nurse       and supervised by the endoscopist. The following parameters were       monitored: oxygen saturation, heart rate, blood pressure, respiratory       rate, EKG, adequacy of pulmonary ventilation, and response to care.       Total physician intraservice time was 33  minutes. Recommendation:           - Patient has a contact number available for                            emergencies. The signs and symptoms of potential                            delayed complications were discussed with the                            patient. Return to normal activities tomorrow.                            Written discharge instructions were provided to the                            patient.                           - Resume previous diet.                           - Continue present medications.                           - Await pathology results. no MRI until clips gone.                            Proceed with a gallbladder ultrasound the first of                            next week. Procedure Code(s):        --- Professional ---                           [phone], Moderate sedation services provided by the                            same physician or other  qualified health care                            professional performing the diagnostic or                            therapeutic service that the sedation supports,                            requiring the presence of an independent trained                            observer to assist in the monitoring of the                            patient's level of consciousness and physiological                            status; initial 15 minutes of intraservice time,                            patient age 7 years or older                           [phone], Moderate sedation services provided by the                            same physician or other qualified health care                            professional performing the diagnostic or                            therapeutic service that the sedation supports,                            requiring the presence of an independent trained                            observer to assist in the monitoring of the                            patient's level of consciousness and  physiological                            status; each additional 15 minutes intraservice                            time (List separately in addition to code for                            primary service) Diagnosis Code(s):        --- Professional ---  K44.9, Diaphragmatic hernia without obstruction or                            gangrene                           R10.13, Epigastric pain CPT copyright 2017 [employer]. All rights reserved. The codes documented in this report are preliminary and upon coder review may  be revised to meet current compliance requirements. [name]. Carol Loftin, MD Norvel Richards, MD 09/12/2017 2:48:44 PM This report has been signed electronically. Number of Addenda: 0

## 2017-09-12 NOTE — Discharge Instructions (Signed)
EGD Discharge instructions Please read the instructions outlined below and refer to this sheet in the next few weeks. These discharge instructions provide you with general information on caring for yourself after you leave the hospital. Your doctor may also give you specific instructions. While your treatment has been planned according to the most current medical practices available, unavoidable complications occasionally occur. If you have any problems or questions after discharge, please call your doctor. ACTIVITY  You may resume your regular activity but move at a slower pace for the next 24 hours.   Take frequent rest periods for the next 24 hours.   Walking will help expel (get rid of) the air and reduce the bloated feeling in your abdomen.   No driving for 24 hours (because of the anesthesia (medicine) used during the test).   You may shower.   Do not sign any important legal documents or operate any machinery for 24 hours (because of the anesthesia used during the test).  NUTRITION  Drink plenty of fluids.   You may resume your normal diet.   Begin with a light meal and progress to your normal diet.   Avoid alcoholic beverages for 24 hours or as instructed by your caregiver.  MEDICATIONS  You may resume your normal medications unless your caregiver tells you otherwise.  WHAT YOU CAN EXPECT TODAY  You may experience abdominal discomfort such as a feeling of fullness or gas pains.  FOLLOW-UP  Your doctor will discuss the results of your test with you.  SEEK IMMEDIATE MEDICAL ATTENTION IF ANY OF THE FOLLOWING OCCUR:  Excessive nausea (feeling sick to your stomach) and/or vomiting.   Severe abdominal pain and distention (swelling).   Trouble swallowing.   Temperature over 101 F (37.8 C).   Rectal bleeding or vomiting of blood.    No future MRI until clips gone.  Gallbladder ultrasound first of the week  Further recommendations to follow pending review of  pathology report

## 2017-09-12 NOTE — Telephone Encounter (Signed)
Received a call from endoscopy: patient needs RUQ ultrasound first of next week. Last saw Magda Paganini in office.

## 2017-09-15 NOTE — Telephone Encounter (Signed)
[place] RUQ scheduled for 09/17/17 at 8:30am, arrive at 8:15am. NPO after midnight before test. Called and informed pt. She is unable to have [place] this week d/t her work. Requested [place] 09/22/17 at 11:30am. [facility] Scheduling, [place] rescheduled to 09/22/17 at 11:30am, arrive at 11:15am. NPO after midnight. Called pt back and informed her of appt. Letter mailed.

## 2017-09-17 ENCOUNTER — Other Ambulatory Visit (HOSPITAL_COMMUNITY): Payer: Self-pay

## 2017-09-17 ENCOUNTER — Encounter (HOSPITAL_COMMUNITY): Payer: Self-pay | Admitting: Internal Medicine

## 2017-09-17 ENCOUNTER — Encounter: Payer: Self-pay | Admitting: Internal Medicine

## 2017-09-17 ENCOUNTER — Telehealth: Payer: Self-pay

## 2017-09-17 NOTE — Telephone Encounter (Signed)
Noted. Letter mailed. Pt states that she really doesn't understand why she feels better since the polyp was removed since it wasn't in her stomach, but she does. There is just a little tenderness where the poly was removed.

## 2017-09-17 NOTE — Telephone Encounter (Signed)
OV made and letter mailed °

## 2017-09-17 NOTE — Telephone Encounter (Signed)
Per RMR- Send letter to patient.  Send copy of letter with path to referring provider and PCP.   Ov 6 weeks;   How is abd pain?

## 2017-09-18 NOTE — Telephone Encounter (Signed)
Polyp was on a very long stalk. I suspect with stomach movement and peristalsis the polyp was being pulled down into the small intestine with tension on the wall of the intestines and that was while causing her pain. Glad she is better. She should keep her follow-up appointment.

## 2017-09-18 NOTE — Telephone Encounter (Signed)
Noted  

## 2017-09-22 ENCOUNTER — Ambulatory Visit (HOSPITAL_COMMUNITY): Payer: Self-pay

## 2017-09-24 ENCOUNTER — Ambulatory Visit (HOSPITAL_COMMUNITY)
Admission: RE | Admit: 2017-09-24 | Discharge: 2017-09-24 | Disposition: A | Discharge status: Home / Self Care | Payer: Self-pay | Source: Ambulatory Visit | Attending: Internal Medicine | Admitting: Internal Medicine

## 2017-09-24 DIAGNOSIS — R1013 Epigastric pain: Secondary | ICD-10-CM | POA: Insufficient documentation

## 2017-09-24 DIAGNOSIS — R110 Nausea: Secondary | ICD-10-CM | POA: Insufficient documentation

## 2017-09-24 DIAGNOSIS — K219 Gastro-esophageal reflux disease without esophagitis: Secondary | ICD-10-CM | POA: Insufficient documentation

## 2017-09-24 DIAGNOSIS — K760 Fatty (change of) liver, not elsewhere classified: Secondary | ICD-10-CM | POA: Insufficient documentation

## 2017-10-08 ENCOUNTER — Other Ambulatory Visit (HOSPITAL_COMMUNITY)
Admission: RE | Admit: 2017-10-08 | Discharge: 2017-10-08 | Disposition: A | Discharge status: Home / Self Care | Payer: Self-pay | Source: Ambulatory Visit | Attending: Physician Assistant | Admitting: Physician Assistant

## 2017-10-08 DIAGNOSIS — E785 Hyperlipidemia, unspecified: Secondary | ICD-10-CM | POA: Insufficient documentation

## 2017-10-08 LAB — COMPREHENSIVE METABOLIC PANEL
ALT: 19 U/L (ref 0–44)
ANION GAP: 7 (ref 5–15)
AST: 13 U/L — ABNORMAL LOW (ref 15–41)
Albumin: 3.7 g/dL (ref 3.5–5.0)
Alkaline Phosphatase: 78 U/L (ref 38–126)
BUN: 12 mg/dL (ref 6–20)
CHLORIDE: 106 mmol/L (ref 98–111)
CO2: 27 mmol/L (ref 22–32)
Calcium: 9 mg/dL (ref 8.9–10.3)
Creatinine, Ser: 0.57 mg/dL (ref 0.44–1.00)
GFR calc non Af Amer: >60 mL/min (ref >60)
Glucose, Bld: 120 mg/dL — ABNORMAL HIGH (ref 70–99)
POTASSIUM: 4.6 mmol/L (ref 3.5–5.1)
SODIUM: 140 mmol/L (ref 135–145)
Total Bilirubin: 0.3 mg/dL (ref 0.3–1.2)
Total Protein: 6.9 g/dL (ref 6.5–8.1)

## 2017-10-08 LAB — LIPID PANEL
CHOL/HDL RATIO: 3.9 ratio
Cholesterol: 156 mg/dL (ref 0–200)
HDL: 40 mg/dL — AB (ref >40)
LDL CALC: 92 mg/dL (ref 0–99)
Triglycerides: 118 mg/dL (ref <150)
VLDL: 24 mg/dL (ref 0–40)

## 2017-10-16 ENCOUNTER — Ambulatory Visit: Payer: Self-pay | Admitting: Physician Assistant

## 2017-10-20 ENCOUNTER — Ambulatory Visit: Payer: Self-pay | Admitting: Nurse Practitioner

## 2017-11-04 ENCOUNTER — Ambulatory Visit: Payer: Self-pay | Admitting: Physician Assistant

## 2017-11-04 ENCOUNTER — Encounter: Payer: Self-pay | Admitting: Physician Assistant

## 2017-11-04 VITALS — BP 120/86 | HR 93 | Temp 97.5°F | Ht 66.0 in | Wt 233.0 lb

## 2017-11-04 DIAGNOSIS — T39395A Adverse effect of other nonsteroidal anti-inflammatory drugs [NSAID], initial encounter: Secondary | ICD-10-CM

## 2017-11-04 DIAGNOSIS — J449 Chronic obstructive pulmonary disease, unspecified: Secondary | ICD-10-CM

## 2017-11-04 DIAGNOSIS — K2960 Other gastritis without bleeding: Secondary | ICD-10-CM

## 2017-11-04 DIAGNOSIS — J00 Acute nasopharyngitis [common cold]: Secondary | ICD-10-CM

## 2017-11-04 DIAGNOSIS — E785 Hyperlipidemia, unspecified: Secondary | ICD-10-CM

## 2017-11-04 DIAGNOSIS — F17219 Nicotine dependence, cigarettes, with unspecified nicotine-induced disorders: Secondary | ICD-10-CM

## 2017-11-04 NOTE — Progress Notes (Signed)
BP 120/86 (BP Location: Left Arm, Patient Position: Sitting, Cuff Size: Normal)   Pulse 93   Temp (!) 97.5 F (36.4 C)   Ht 5\' 6"  (1.676 m)   Wt 233 lb (105.7 kg)   SpO2 96%   BMI 37.61 kg/m    Subjective:    Patient ID: Lorraine Peters, female    DOB: 01/28/1971, 46 y.o.   MRN: [id number]  HPI: ALLYN BARTELSON is a 47 y.o. female presenting on 11/04/2017 for Follow-up   HPI Pt starts new job in [place] on [place] [employer].    Pt has had some congrestion past few days and drainage feeling in the ears.  No fevers   She is Still smoking but has cut back.   Relevant past medical, surgical, family and social history reviewed and updated as indicated. Interim medical history since our last visit reviewed. Allergies and medications reviewed and updated.   Current Outpatient Medications:  .  acetaminophen (TYLENOL) 500 MG tablet, Take 1,000 mg by mouth every 6 (six) hours as needed. , Disp: , Rfl:  .  albuterol (PROVENTIL HFA;VENTOLIN HFA) 108 (90 Base) MCG/ACT inhaler, Inhale 2 puffs into the lungs every 6 (six) hours as needed for wheezing or shortness of breath., Disp: 1 Inhaler, Rfl: 1 .  atorvastatin (LIPITOR) 20 MG tablet, TAKE 1 Tablet BY MOUTH ONCE DAILY, Disp: 90 tablet, Rfl: 2 .  dicyclomine (BENTYL) 20 MG tablet, Take 1 tablet (20 mg total) by mouth 2 (two) times daily as needed for spasms., Disp: 20 tablet, Rfl: 0 .  MAXALT 10 MG tablet, TAKE 1 TABLET BY MOUTH AT ONSET OF HEADACHE, MAY REPEAT IN 2 HOURS. MAX 2 TABLETS IN 24 HOURS., Disp: 15 tablet, Rfl: 0 .  omeprazole (PRILOSEC) 40 MG capsule, Take 40 mg by mouth daily., Disp: , Rfl:  .  sucralfate (CARAFATE) 1 g tablet, Take 1 tablet (1 g total) by mouth 4 (four) times daily -  with meals and at bedtime. (Patient taking differently: Take 1 g by mouth 3 times/day as needed-between meals & bedtime. ), Disp: 40 tablet, Rfl: 0   Review of Systems  Constitutional: Negative for appetite change, chills, diaphoresis, fatigue, fever  and unexpected weight change.  HENT: Positive for congestion and ear pain. Negative for dental problem, drooling, facial swelling, hearing loss, mouth sores, sneezing, sore throat, trouble swallowing and voice change.   Eyes: Negative for pain, discharge, redness, itching and visual disturbance.  Respiratory: Negative for cough, choking, shortness of breath and wheezing.   Cardiovascular: Negative for chest pain, palpitations and leg swelling.  Gastrointestinal: Negative for abdominal pain, blood in stool, constipation, diarrhea and vomiting.  Endocrine: Negative for cold intolerance, heat intolerance and polydipsia.  Genitourinary: Negative for decreased urine volume, dysuria and hematuria.  Musculoskeletal: Negative for arthralgias, back pain and gait problem.  Skin: Negative for rash.  Allergic/Immunologic: Negative for environmental allergies.  Neurological: Negative for seizures, syncope, light-headedness and headaches.  Hematological: Negative for adenopathy.  Psychiatric/Behavioral: Negative for agitation, dysphoric mood and suicidal ideas. The patient is not nervous/anxious.     Per HPI unless specifically indicated above     Objective:    BP 120/86 (BP Location: Left Arm, Patient Position: Sitting, Cuff Size: Normal)   Pulse 93   Temp (!) 97.5 F (36.4 C)   Ht 5\' 6"  (1.676 m)   Wt 233 lb (105.7 kg)   SpO2 96%   BMI 37.61 kg/m   Wt Readings from Last 3  Encounters:  11/04/17 233 lb (105.7 kg)  09/01/17 226 lb 9.6 oz (102.8 kg)  07/17/17 221 lb (100.2 kg)    Physical Exam  Constitutional: She is oriented to person, place, and time. She appears well-developed and well-nourished.  HENT:  Head: Normocephalic and atraumatic.  Right Ear: Hearing, tympanic membrane, external ear and ear canal normal.  Left Ear: Hearing, tympanic membrane, external ear and ear canal normal.  Nose: Nose normal.  Mouth/Throat: Uvula is midline and oropharynx is clear and moist. No oropharyngeal  exudate.  Neck: Neck supple.  Cardiovascular: Normal rate and regular rhythm.  Pulmonary/Chest: Effort normal and breath sounds normal. She has no wheezes.  Abdominal: Soft. Bowel sounds are normal. She exhibits no mass. There is no hepatosplenomegaly. There is no tenderness.  Musculoskeletal: She exhibits no edema.  Lymphadenopathy:    She has no cervical adenopathy.  Neurological: She is alert and oriented to person, place, and time.  Skin: Skin is warm and dry.  Psychiatric: She has a normal mood and affect. Her behavior is normal.  Vitals reviewed.   Results for orders placed or performed during the hospital encounter of 10/08/17  Lipid panel  Result Value Ref Range   Cholesterol 156 0 - 200 mg/dL   Triglycerides 118 <150 mg/dL   HDL 40 (L) >40 mg/dL   Total CHOL/HDL Ratio 3.9 RATIO   VLDL 24 0 - 40 mg/dL   LDL Cholesterol 92 0 - 99 mg/dL  Comprehensive metabolic panel  Result Value Ref Range   Sodium 140 135 - 145 mmol/L   Potassium 4.6 3.5 - 5.1 mmol/L   Chloride 106 98 - 111 mmol/L   CO2 27 22 - 32 mmol/L   Glucose, Bld 120 (H) 70 - 99 mg/dL   BUN 12 6 - 20 mg/dL   Creatinine, Ser 0.57 0.44 - 1.00 mg/dL   Calcium 9.0 8.9 - 10.3 mg/dL   Total Protein 6.9 6.5 - 8.1 g/dL   Albumin 3.7 3.5 - 5.0 g/dL   AST 13 (L) 15 - 41 U/L   ALT 19 0 - 44 U/L   Alkaline Phosphatase 78 38 - 126 U/L   Total Bilirubin 0.3 0.3 - 1.2 mg/dL   GFR calc non Af Amer >60 >60 mL/min   GFR calc Af Amer >60 >60 mL/min   Anion gap 7 5 - 15      Assessment & Plan:    Encounter Diagnoses  Name Primary?  . Hyperlipidemia, unspecified hyperlipidemia type Yes  . Chronic obstructive pulmonary disease, unspecified COPD type ([place])   . Cigarette nicotine dependence with nicotine-induced disorder   . Acute nasopharyngitis     -reviewed labs with pt -recommended pt use some otc mucinex and/or claritin -counseled smoking cessation -pt to continue current medications -pt would like to get PAP  updated before starting her new job.  She will RTO in 2-3 weeks for this.  RTO sooner prn

## 2017-11-18 ENCOUNTER — Ambulatory Visit: Payer: Self-pay | Admitting: Physician Assistant

## 2017-12-10 ENCOUNTER — Ambulatory Visit: Payer: Self-pay | Admitting: Physician Assistant

## 2017-12-10 ENCOUNTER — Other Ambulatory Visit (HOSPITAL_COMMUNITY)
Admission: RE | Admit: 2017-12-10 | Discharge: 2017-12-10 | Disposition: A | Discharge status: Home / Self Care | Payer: Self-pay | Source: Ambulatory Visit | Attending: Physician Assistant | Admitting: Physician Assistant

## 2017-12-10 ENCOUNTER — Encounter: Payer: Self-pay | Admitting: Physician Assistant

## 2017-12-10 VITALS — BP 118/68 | HR 112 | Temp 97.9°F | Ht 66.0 in | Wt 275.0 lb

## 2017-12-10 DIAGNOSIS — Z124 Encounter for screening for malignant neoplasm of cervix: Secondary | ICD-10-CM | POA: Insufficient documentation

## 2017-12-10 NOTE — Progress Notes (Signed)
BP 118/68 (BP Location: Left Arm, Patient Position: Sitting, Cuff Size: Normal)   Pulse (!) 112   Temp 97.9 F (36.6 C)   Wt 275 lb (124.7 kg)   LMP 11/10/2017 (Approximate)   SpO2 96%   BMI 44.39 kg/m    Subjective:    Patient ID: Lorraine Peters, female    DOB: [birth year], 47 y.o.   MRN: [id number]  HPI: Lorraine Peters is a 47 y.o. 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  Relevant past medical, surgical, family and social history reviewed and updated as indicated. Interim medical history since our last visit reviewed. Allergies and medications reviewed and updated.   Current Outpatient Medications:  .  acetaminophen (TYLENOL) 500 MG tablet, Take 1,000 mg by mouth every 6 (six) hours as needed. , Disp: , Rfl:  .  albuterol (PROVENTIL HFA;VENTOLIN HFA) 108 (90 Base) MCG/ACT inhaler, Inhale 2 puffs into the lungs every 6 (six) hours as needed for wheezing or shortness of breath., Disp: 1 Inhaler, Rfl: 1 .  atorvastatin (LIPITOR) 20 MG tablet, TAKE 1 Tablet BY MOUTH ONCE DAILY, Disp: 90 tablet, Rfl: 2 .  dicyclomine (BENTYL) 20 MG tablet, Take 1 tablet (20 mg total) by mouth 2 (two) times daily as needed for spasms., Disp: 20 tablet, Rfl: 0 .  MAXALT 10 MG tablet, TAKE 1 TABLET BY MOUTH AT ONSET OF HEADACHE, MAY REPEAT IN 2 HOURS. MAX 2 TABLETS IN 24 HOURS., Disp: 15 tablet, Rfl: 0 .  omeprazole (PRILOSEC) 40 MG capsule, Take 40 mg by mouth daily., Disp: , Rfl:  .  sucralfate (CARAFATE) 1 g tablet, Take 1 tablet (1 g total) by mouth 4 (four) times daily -  with meals and at bedtime. (Patient taking differently: Take 1 g by mouth 3 times/day as needed-between meals & bedtime. ), Disp: 40 tablet, Rfl: 0   Review of Systems  Constitutional: Positive for fatigue. Negative for appetite change, chills, diaphoresis, fever and unexpected weight change.  HENT: Positive for  congestion, sneezing and sore throat. Negative for dental problem, drooling, ear pain, facial swelling, hearing loss, mouth sores, trouble swallowing and voice change.   Eyes: Negative for pain, discharge, redness, itching and visual disturbance.  Respiratory: Positive for cough. Negative for choking, shortness of breath and wheezing.   Cardiovascular: Negative for chest pain, palpitations and leg swelling.  Gastrointestinal: Positive for diarrhea. Negative for abdominal pain, blood in stool, constipation and vomiting.  Endocrine: Negative for cold intolerance, heat intolerance and polydipsia.  Genitourinary: Negative for decreased urine volume, dysuria and hematuria.  Musculoskeletal: Negative for arthralgias, back pain and gait problem.  Skin: Negative for rash.  Allergic/Immunologic: Negative for environmental allergies.  Neurological: Negative for seizures, syncope, light-headedness and headaches.  Hematological: Negative for adenopathy.  Psychiatric/Behavioral: Negative for agitation, dysphoric mood and suicidal ideas. The patient is not nervous/anxious.     Per HPI unless specifically indicated above     Objective:    BP 118/68 (BP Location: Left Arm, Patient Position: Sitting, Cuff Size: Normal)   Pulse (!) 112   Temp 97.9 F (36.6 C)   Wt 275 lb (124.7 kg)   LMP 11/10/2017 (Approximate)   SpO2 96%   BMI 44.39 kg/m   Wt Readings from Last 3 Encounters:  12/10/17 275 lb (124.7 kg)  11/04/17 233 lb (105.7 kg)  09/01/17 226 lb 9.6 oz (  102.8 kg)    Physical Exam  Constitutional: She is oriented to person, place, and time. She appears well-developed and well-nourished.  Pulmonary/Chest: Effort normal. No breast tenderness, discharge or bleeding.  Breast exam normal  Abdominal: Soft. She exhibits no mass. There is no tenderness. There is no rebound and no guarding.  Genitourinary: Vagina normal and uterus normal. No breast tenderness, discharge or bleeding. There is no rash,  tenderness or lesion on the right labia. There is no rash, tenderness or lesion on the left labia. Cervix exhibits no motion tenderness, no discharge and no friability. Right adnexum displays no mass, no tenderness and no fullness. Left adnexum displays no mass, no tenderness and no fullness.  Genitourinary Comments: (nurse Berenice assisted)  Neurological: She is alert and oriented to person, place, and time.  Skin: Skin is warm and dry.  Psychiatric: She has a normal mood and affect. Her behavior is normal.  Nursing note and vitals reviewed.       Assessment & Plan:    Encounter Diagnosis  Name Primary?  . Routine Papanicolaou smear Yes    -pt counseled to establish with new PCP with her new insurance.  She is to RTO here if she has any problems before the insurance takes effect

## 2017-12-17 ENCOUNTER — Ambulatory Visit: Payer: Self-pay | Admitting: Nurse Practitioner

## 2017-12-18 ENCOUNTER — Ambulatory Visit: Payer: Self-pay | Admitting: Physician Assistant

## 2017-12-18 ENCOUNTER — Encounter: Payer: Self-pay | Admitting: Physician Assistant

## 2017-12-18 VITALS — BP 144/90 | HR 87 | Temp 97.9°F | Ht 66.0 in | Wt 224.2 lb

## 2017-12-18 DIAGNOSIS — J440 Chronic obstructive pulmonary disease with acute lower respiratory infection: Secondary | ICD-10-CM

## 2017-12-18 DIAGNOSIS — F17200 Nicotine dependence, unspecified, uncomplicated: Secondary | ICD-10-CM

## 2017-12-18 DIAGNOSIS — J209 Acute bronchitis, unspecified: Secondary | ICD-10-CM

## 2017-12-18 MED ORDER — ALBUTEROL SULFATE HFA 108 (90 BASE) MCG/ACT IN AERS: 2.0000 | INHALATION_SPRAY | Freq: Four times a day (QID) | RESPIRATORY_TRACT | 0 refills | Status: DC | PRN

## 2017-12-18 MED ORDER — AZITHROMYCIN 250 MG PO TABS: ORAL_TABLET | ORAL | 0 refills | Status: AC

## 2017-12-18 NOTE — Progress Notes (Signed)
BP (!) 144/90 (BP Location: Right Arm, Patient Position: Sitting, Cuff Size: Normal)   Pulse 87   Temp 97.9 F (36.6 C)   Ht 5\' 6"  (1.676 m)   Wt 224 lb 4 oz (101.7 kg)   SpO2 96%   BMI 36.19 kg/m    Subjective:    Patient ID: Lorraine Peters, female    DOB: 1970-05-27, 47 y.o.   MRN: [id number]  HPI: Lorraine Peters is a 47 y.o. female presenting on 12/18/2017 for Nasal Congestion (cough, HA, dark brown mucous. sx began last week. pt has taken mucinex and alka- seltzers which help when first taken)   HPI  Chief Complaint  Patient presents with  . Nasal Congestion    cough, HA, dark brown mucous. sx began last week. pt has taken mucinex and alka- seltzers which help when first taken    Her inhaler is out. she is still smoking  Relevant past medical, surgical, family and social history reviewed and updated as indicated. Interim medical history since our last visit reviewed. Allergies and medications reviewed and updated.   Current Outpatient Medications:  .  acetaminophen (TYLENOL) 500 MG tablet, Take 1,000 mg by mouth every 6 (six) hours as needed. , Disp: , Rfl:  .  atorvastatin (LIPITOR) 20 MG tablet, TAKE 1 Tablet BY MOUTH ONCE DAILY, Disp: 90 tablet, Rfl: 2 .  dicyclomine (BENTYL) 20 MG tablet, Take 1 tablet (20 mg total) by mouth 2 (two) times daily as needed for spasms., Disp: 20 tablet, Rfl: 0 .  MAXALT 10 MG tablet, TAKE 1 TABLET BY MOUTH AT ONSET OF HEADACHE, MAY REPEAT IN 2 HOURS. MAX 2 TABLETS IN 24 HOURS., Disp: 15 tablet, Rfl: 0 .  omeprazole (PRILOSEC) 40 MG capsule, Take 40 mg by mouth daily., Disp: , Rfl:  .  sucralfate (CARAFATE) 1 g tablet, Take 1 tablet (1 g total) by mouth 4 (four) times daily -  with meals and at bedtime. (Patient taking differently: Take 1 g by mouth 3 times/day as needed-between meals & bedtime. ), Disp: 40 tablet, Rfl: 0 .  albuterol (PROVENTIL HFA;VENTOLIN HFA) 108 (90 Base) MCG/ACT inhaler, Inhale 2 puffs into the lungs every 6 (six) hours  as needed for wheezing or shortness of breath. (Patient not taking: Reported on 12/18/2017), Disp: 1 Inhaler, Rfl: 1  Review of Systems  Constitutional: Positive for fatigue. Negative for appetite change, chills, diaphoresis, fever and unexpected weight change.  HENT: Positive for congestion. Negative for dental problem, drooling, ear pain, facial swelling, hearing loss, mouth sores, sneezing, sore throat, trouble swallowing and voice change.   Eyes: Negative for pain, discharge, redness, itching and visual disturbance.  Respiratory: Positive for cough, shortness of breath and wheezing. Negative for choking.   Cardiovascular: Negative for chest pain, palpitations and leg swelling.  Gastrointestinal: Negative for abdominal pain, blood in stool, constipation, diarrhea and vomiting.  Endocrine: Negative for cold intolerance, heat intolerance and polydipsia.  Genitourinary: Negative for decreased urine volume, dysuria and hematuria.  Musculoskeletal: Negative for arthralgias, back pain and gait problem.  Skin: Negative for rash.  Allergic/Immunologic: Negative for environmental allergies.  Neurological: Positive for headaches. Negative for seizures, syncope and light-headedness.  Hematological: Positive for adenopathy.  Psychiatric/Behavioral: Negative for agitation, dysphoric mood and suicidal ideas. The patient is not nervous/anxious.     Per HPI unless specifically indicated above     Objective:    BP (!) 144/90 (BP Location: Right Arm, Patient Position: Sitting, Cuff Size: Normal)  Pulse 87   Temp 97.9 F (36.6 C)   Ht 5\' 6"  (1.676 m)   Wt 224 lb 4 oz (101.7 kg)   SpO2 96%   BMI 36.19 kg/m   Wt Readings from Last 3 Encounters:  12/18/17 224 lb 4 oz (101.7 kg)  12/10/17 275 lb (124.7 kg)  11/04/17 233 lb (105.7 kg)    Physical Exam  Constitutional: She is oriented to person, place, and time. She appears well-developed and well-nourished.  HENT:  Head: Normocephalic and  atraumatic.  Right Ear: Hearing, tympanic membrane, external ear and ear canal normal.  Left Ear: Hearing, tympanic membrane, external ear and ear canal normal.  Nose: Rhinorrhea present.  Mouth/Throat: Uvula is midline and oropharynx is clear and moist. No oropharyngeal exudate.  Neck: Neck supple.  Cardiovascular: Normal rate and regular rhythm.  Pulmonary/Chest: Effort normal and breath sounds normal. She has no wheezes.  Lymphadenopathy:    She has no cervical adenopathy.  Neurological: She is alert and oriented to person, place, and time.  Skin: Skin is warm and dry.  Psychiatric: She has a normal mood and affect. Her behavior is normal.  Vitals reviewed.       Assessment & Plan:    Encounter Diagnoses  Name Primary?  . Acute bronchitis with COPD ([place]) Yes  . Tobacco use disorder      -Plan- zpack  inhaler to [facility] -[facility] says she has insurance -OTCs as needed -Avoid smoking

## 2018-02-02 ENCOUNTER — Other Ambulatory Visit (HOSPITAL_COMMUNITY): Payer: Self-pay | Admitting: Internal Medicine

## 2018-02-02 DIAGNOSIS — Z1231 Encounter for screening mammogram for malignant neoplasm of breast: Secondary | ICD-10-CM

## 2018-02-06 ENCOUNTER — Other Ambulatory Visit: Payer: Self-pay

## 2018-02-06 ENCOUNTER — Emergency Department (HOSPITAL_COMMUNITY): Payer: 59

## 2018-02-06 ENCOUNTER — Emergency Department (HOSPITAL_COMMUNITY)
Admission: EM | Admit: 2018-02-06 | Discharge: 2018-02-06 | Disposition: A | Discharge status: Home / Self Care | Payer: 59 | Attending: Emergency Medicine | Admitting: Emergency Medicine

## 2018-02-06 ENCOUNTER — Encounter (HOSPITAL_COMMUNITY): Payer: Self-pay

## 2018-02-06 DIAGNOSIS — N201 Calculus of ureter: Secondary | ICD-10-CM | POA: Insufficient documentation

## 2018-02-06 DIAGNOSIS — N132 Hydronephrosis with renal and ureteral calculous obstruction: Secondary | ICD-10-CM | POA: Diagnosis not present

## 2018-02-06 DIAGNOSIS — R109 Unspecified abdominal pain: Secondary | ICD-10-CM | POA: Diagnosis present

## 2018-02-06 DIAGNOSIS — F17210 Nicotine dependence, cigarettes, uncomplicated: Secondary | ICD-10-CM | POA: Diagnosis not present

## 2018-02-06 DIAGNOSIS — Z79899 Other long term (current) drug therapy: Secondary | ICD-10-CM | POA: Insufficient documentation

## 2018-02-06 DIAGNOSIS — N3001 Acute cystitis with hematuria: Secondary | ICD-10-CM | POA: Insufficient documentation

## 2018-02-06 DIAGNOSIS — N2889 Other specified disorders of kidney and ureter: Secondary | ICD-10-CM | POA: Diagnosis not present

## 2018-02-06 LAB — URINALYSIS, ROUTINE W REFLEX MICROSCOPIC
Bilirubin Urine: NEGATIVE
GLUCOSE, UA: NEGATIVE mg/dL
Ketones, ur: NEGATIVE mg/dL
LEUKOCYTES UA: NEGATIVE
NITRITE: NEGATIVE
PH: 5 (ref 5.0–8.0)
Protein, ur: NEGATIVE mg/dL
Specific Gravity, Urine: 1.015 (ref 1.005–1.030)

## 2018-02-06 MED ORDER — CEPHALEXIN 500 MG PO CAPS: 500.0000 mg | ORAL_CAPSULE | Freq: Three times a day (TID) | ORAL | 0 refills | Status: DC

## 2018-02-06 MED ORDER — ONDANSETRON HCL 4 MG/2ML IJ SOLN
4.0000 mg | Freq: Once | INTRAMUSCULAR | Status: AC
  Administered 2018-02-06: 4 mg via INTRAVENOUS
  Filled 2018-02-06: qty 2

## 2018-02-06 MED ORDER — OXYCODONE-ACETAMINOPHEN 5-325 MG PO TABS
1.0000 | ORAL_TABLET | Freq: Once | ORAL | Status: AC
  Administered 2018-02-06: 1 via ORAL
  Filled 2018-02-06: qty 1

## 2018-02-06 MED ORDER — TAMSULOSIN HCL 0.4 MG PO CAPS: ORAL_CAPSULE | ORAL | 0 refills | Status: DC

## 2018-02-06 MED ORDER — FENTANYL CITRATE (PF) 100 MCG/2ML IJ SOLN
50.0000 ug | Freq: Once | INTRAMUSCULAR | Status: AC
  Administered 2018-02-06: 50 ug via INTRAVENOUS
  Filled 2018-02-06: qty 2

## 2018-02-06 MED ORDER — KETOROLAC TROMETHAMINE 30 MG/ML IJ SOLN
30.0000 mg | Freq: Once | INTRAMUSCULAR | Status: AC
  Administered 2018-02-06: 30 mg via INTRAVENOUS
  Filled 2018-02-06: qty 1

## 2018-02-06 MED ORDER — OXYCODONE-ACETAMINOPHEN 5-325 MG PO TABS: 1.0000 | ORAL_TABLET | Freq: Four times a day (QID) | ORAL | 0 refills | Status: DC | PRN

## 2018-02-06 MED ORDER — ONDANSETRON HCL 4 MG PO TABS: 4.0000 mg | ORAL_TABLET | Freq: Three times a day (TID) | ORAL | 0 refills | Status: DC | PRN

## 2018-02-06 MED ORDER — CEPHALEXIN 500 MG PO CAPS
500.0000 mg | ORAL_CAPSULE | Freq: Once | ORAL | Status: AC
  Administered 2018-02-06: 500 mg via ORAL
  Filled 2018-02-06: qty 1

## 2018-02-06 NOTE — ED Triage Notes (Signed)
Pt complaining of left sided flank pain that started around 11pm. Pt has history of kidney stones and said this feels very similar.

## 2018-02-06 NOTE — ED Provider Notes (Signed)
[facility] EMERGENCY DEPARTMENT Provider Note   CSN: [id number] Arrival date & time: 02/06/18  0150  Time seen 2:10 AM   History   Chief Complaint Chief Complaint  Patient presents with  . Flank Pain    Left side    HPI Lorraine Peters is a 47 y.o. female.  HPI patient states she has had some lower back pain off and on for a few days, she states at 11:30 PM she started getting left lower quadrant pain that is now radiated into her left flank.  She describes it as sharp and constant with nausea but no vomiting.  She denies hematuria but states she is just finishing her menses.  She has had kidney stones before and states she is passed 36.  She does relate she drinks 4 cups of coffee a day but she is not a big milk drinker.  She does not know what kind of stone she has had.  She does not have a urologist.  She states she ovarian cysts and  sometimes has a hard time telling them apart.  PCP Celene Squibb, MD first appointment on Dec 3   Past Medical History:  Diagnosis Date  . Hypercholesterolemia   . Kidney stones   . Migraine   . Ovarian cyst   . Reflux     Patient Active Problem List   Diagnosis Date Noted  . Abdominal pain, epigastric 09/01/2017  . Nausea without vomiting 09/01/2017  . Family history of carcinoma in situ of anal canal 09/01/2017  . Pain in right wrist 06/02/2017  . Pelvic pain in female 03/22/2015  . History of ovarian cyst 03/22/2015  . Abnormal vaginal bleeding 03/22/2015  . Hyperlipidemia 02/21/2015  . Esophageal reflux 02/21/2015  . Cigarette nicotine dependence, uncomplicated 32/44/0102  . Obesity, unspecified 02/21/2015  . Excessive bleeding in premenopausal period 02/21/2015    Past Surgical History:  Procedure Laterality Date  . ESOPHAGOGASTRODUODENOSCOPY N/A 09/12/2017   Procedure: ESOPHAGOGASTRODUODENOSCOPY (EGD);  Surgeon: Daneil Dolin, MD;  Location: [facility];  Service: Endoscopy;  Laterality: N/A;  2:30pm  . TUBAL LIGATION        OB History    Gravida      Para      Term      Preterm      AB      Living  1     SAB      TAB      Ectopic      Multiple      Live Births               Home Medications    Prior to Admission medications   Medication Sig Start Date End Date Taking? Authorizing Provider  acetaminophen (TYLENOL) 500 MG tablet Take 1,000 mg by mouth every 6 (six) hours as needed.     [provider]  albuterol (PROVENTIL HFA;VENTOLIN HFA) 108 (90 Base) MCG/ACT inhaler Inhale 2 puffs into the lungs every 6 (six) hours as needed for wheezing or shortness of breath. Patient not taking: Reported on 12/18/2017 05/30/16   Soyla Dryer, PA-C  albuterol (PROVENTIL HFA;VENTOLIN HFA) 108 (90 Base) MCG/ACT inhaler Inhale 2 puffs into the lungs every 6 (six) hours as needed for wheezing or shortness of breath. 12/18/17   Soyla Dryer, PA-C  atorvastatin (LIPITOR) 20 MG tablet TAKE 1 Tablet BY MOUTH ONCE DAILY 04/07/17   Soyla Dryer, PA-C  cephALEXin (KEFLEX) 500 MG capsule Take 1 capsule (500 mg  total) by mouth 3 (three) times daily. 02/06/18   Rolland Porter, MD  dicyclomine (BENTYL) 20 MG tablet Take 1 tablet (20 mg total) by mouth 2 (two) times daily as needed for spasms. 03/15/17   Julianne Rice, MD  MAXALT 10 MG tablet TAKE 1 TABLET BY MOUTH AT ONSET OF HEADACHE, MAY REPEAT IN 2 HOURS. MAX 2 TABLETS IN 24 HOURS. 07/01/17   Soyla Dryer, PA-C  omeprazole (PRILOSEC) 40 MG capsule Take 40 mg by mouth daily.    [provider]  ondansetron (ZOFRAN) 4 MG tablet Take 1 tablet (4 mg total) by mouth every 8 (eight) hours as needed for nausea or vomiting. 02/06/18   Rolland Porter, MD  oxyCODONE-acetaminophen (PERCOCET/ROXICET) 5-325 MG tablet Take 1 tablet by mouth every 6 (six) hours as needed for severe pain. 02/06/18   Rolland Porter, MD  sucralfate (CARAFATE) 1 g tablet Take 1 tablet (1 g total) by mouth 4 (four) times daily -  with meals and at bedtime. Patient taking  differently: Take 1 g by mouth 3 times/day as needed-between meals & bedtime.  03/15/17   Julianne Rice, MD  tamsulosin (FLOMAX) 0.4 MG CAPS capsule Take 1 po QD until you pass the stone. 02/06/18   Rolland Porter, MD    Family History Family History  Problem Relation Age of Onset  . Arthritis Mother   . Cancer Mother        breast  . Breast cancer Mother 35  . COPD Father   . Heart disease Father   . Hyperlipidemia Father   . Hypertension Father   . Cancer Sister 35       anal, deceased due to mva but had advanced disease  . Stroke Maternal Grandfather   . Cancer Maternal Aunt        breast  . Breast cancer Maternal Aunt   . Cancer Maternal Uncle        breast  . Breast cancer Maternal Uncle     Social History Social History   Tobacco Use  . Smoking status: Current Every Day Smoker    Packs/day: 0.50    Years: 28.00    Pack years: 14.00    Types: Cigarettes  . Smokeless tobacco: Never Used  Substance Use Topics  . Alcohol use: No    Alcohol/week: 0.0 standard drinks  . Drug use: No     Allergies   Bee venom   Review of Systems Review of Systems  All other systems reviewed and are negative.    Physical Exam Updated Vital Signs BP (!) 170/99   Pulse 89   Temp 98.1 F (36.7 C) (Oral)   Ht 5\' 7"  (1.702 m)   Wt 97.5 kg   SpO2 98%   BMI 33.67 kg/m   Vital signs normal except for hypertension   Physical Exam  Constitutional: She is oriented to person, place, and time. She appears well-developed and well-nourished.  Non-toxic appearance. She does not appear ill. She appears distressed.  HENT:  Head: Normocephalic and atraumatic.  Right Ear: External ear normal.  Left Ear: External ear normal.  Nose: Nose normal. No mucosal edema or rhinorrhea.  Mouth/Throat: Oropharynx is clear and moist and mucous membranes are normal. No dental abscesses or uvula swelling.  Eyes: Pupils are equal, round, and reactive to light. Conjunctivae and EOM are normal.    Neck: Normal range of motion and full passive range of motion without pain. Neck supple.  Cardiovascular: Normal rate, regular rhythm and normal  heart sounds. Exam reveals no gallop and no friction rub.  No murmur heard. Pulmonary/Chest: Effort normal and breath sounds normal. No respiratory distress. She has no wheezes. She has no rhonchi. She has no rales. She exhibits no tenderness and no crepitus.  Abdominal: Soft. Normal appearance and bowel sounds are normal. She exhibits no distension. There is no tenderness. There is no rebound and no guarding.  Negative CVA tenderness  Musculoskeletal: Normal range of motion. She exhibits no edema or tenderness.  Moves all extremities well.   Neurological: She is alert and oriented to person, place, and time. She has normal strength. No cranial nerve deficit.  Skin: Skin is warm, dry and intact. No rash noted. No erythema. No pallor.  Psychiatric: Her speech is normal and behavior is normal. Her mood appears anxious.  Nursing note and vitals reviewed.    ED Treatments / Results  Labs (all labs ordered are listed, but only abnormal results are displayed) Results for orders placed or performed during the hospital encounter of 02/06/18  Urinalysis, Routine w reflex microscopic  Result Value Ref Range   Color, Urine YELLOW YELLOW   APPearance HAZY (A) CLEAR   Specific Gravity, Urine 1.015 1.005 - 1.030   pH 5.0 5.0 - 8.0   Glucose, UA NEGATIVE NEGATIVE mg/dL   Hgb urine dipstick LARGE (A) NEGATIVE   Bilirubin Urine NEGATIVE NEGATIVE   Ketones, ur NEGATIVE NEGATIVE mg/dL   Protein, ur NEGATIVE NEGATIVE mg/dL   Nitrite NEGATIVE NEGATIVE   Leukocytes, UA NEGATIVE NEGATIVE   RBC / HPF 21-50 0 - 5 RBC/hpf   WBC, UA 6-10 0 - 5 WBC/hpf   Bacteria, UA RARE (A) NONE SEEN   Squamous Epithelial / LPF 0-5 0 - 5   Mucus PRESENT    Hyaline Casts, UA PRESENT    Uric Acid Crys, UA PRESENT    Laboratory interpretation all normal except possible  UTI    EKG None  Radiology Ct Renal Stone Study  Result Date: 02/06/2018 CLINICAL DATA:  Initial evaluation for acute left flank pain. EXAM: CT ABDOMEN AND PELVIS WITHOUT CONTRAST TECHNIQUE: Multidetector CT imaging of the abdomen and pelvis was performed following the standard protocol without IV contrast. COMPARISON:  Prior CT from 03/08/2014. FINDINGS: Lower chest: Visualized lung bases are clear. Hepatobiliary: Subcentimeter hypodensity noted at the hepatic dome, too small the characterize, but of doubtful clinical significance. Limited noncontrast evaluation of the liver is otherwise unremarkable. Gallbladder within normal limits. No biliary dilatation. Pancreas: Pancreas within normal limits. Spleen: Spleen within normal limits. Adrenals/Urinary Tract: Adrenal glands are normal. On the left, there is a 4 mm obstructive stone within the proximal left ureter just distal to the left UPJ (series 2, image 45). Secondary mild left-sided hydronephrosis. Possible additional 4 mm stone positioned at the level of the mid left ureter (series 2, image 62). No other radiopaque calculi seen along the course of the left ureter. Additional punctate 2-3 mm nonobstructive nephrolithiasis within the left kidney. On the right, a few nonobstructive calculi present within the lower pole, largest of which measures 4 mm. No radiopaque calculi seen along the course of the right renal collecting system. No right-sided hydronephrosis or hydroureter. There is suggestion of a 18 mm lesion within the upper pole of the left kidney (series 2, image 28). This demonstrates intermediate density of approximate 28 Hounsfield units. Cyst with seen within this region on prior CT. No other discernible renal lesions identified. Partially distended bladder within normal limits. No layering stones  within the bladder lumen. Stomach/Bowel: Stomach within normal limits. No evidence for bowel obstruction. No acute inflammatory changes seen about  the bowels. Normal appendix. Vascular/Lymphatic: Mild to moderate aorto bi-iliac atherosclerotic disease. No aneurysm. No adenopathy. Reproductive: Uterus within normal limits. Subcentimeter calcific densities noted within the ovaries bilaterally. Ovaries otherwise unremarkable. Other: No free air or fluid. Musculoskeletal: No acute osseus abnormality. No discrete lytic or blastic osseous lesions. Shallow posterior disc bulge noted at L5-S1. IMPRESSION: 1. 4 mm obstructive stone within the proximal left ureter with secondary mild left hydronephrosis. Possible additional 4 mm stone within the mid left ureter as above. 2. Additional bilateral nonobstructive nephrolithiasis as above. 3. 18 mm intermediate density left renal lesion, indeterminate. Follow-up examination with nonemergent renal mass protocol CT and/or MRI recommended. 4. No other acute intra-abdominal or pelvic process. Electronically Signed   By: Jeannine Boga M.D.   On: 02/06/2018 03:44    Procedures Procedures (including critical care time)  Medications Ordered in ED Medications  ondansetron (ZOFRAN) injection 4 mg (4 mg Intravenous Given 02/06/18 0226)  ketorolac (TORADOL) 30 MG/ML injection 30 mg (30 mg Intravenous Given 02/06/18 0226)  fentaNYL (SUBLIMAZE) injection 50 mcg (50 mcg Intravenous Given 02/06/18 0320)  cephALEXin (KEFLEX) capsule 500 mg (500 mg Oral Given 02/06/18 0439)  oxyCODONE-acetaminophen (PERCOCET/ROXICET) 5-325 MG per tablet 1 tablet (1 tablet Oral Given 02/06/18 0439)     Initial Impression / Assessment and Plan / ED Course  I have reviewed the triage vital signs and the nursing notes.  Pertinent labs & imaging results that were available during my care of the patient were reviewed by me and considered in my medical decision making (see chart for details).     When I review her prior labs on July 31 of this year her BUN was 12 and her creatinine was 0.57.  She was given IV Toradol for pain and Zofran  for nausea.  CT renal was done to look to see if she was passing left ureteral stone.  Recheck at 3:05 AM patient states her pain is better but still having significant pain, she was given fentanyl.  We are still waiting for her CT to result.  3:40 AM patient was updated we are still waiting for the radiologist to read her CT.  4:15 AM we discussed her CT results.  She states she has had stents in the past.  We are still waiting for her urinalysis to result to see if she needs to be on antibiotics.  We discussed [facility]up with [facility].  We also discussed the need to have an MRI to look at this area on her kidney closer.  At this time she did not feel like she needed something else for pain.  4:50 AM I discussed her urinalysis results.  It was borderline if there was a UTI present or not, urine culture was sent and she was started on oral Keflex.  We also discussed she had uric acid crystals in her urine suggested that she is having uric acid renal stones.  She denies a family history of gout.  This was something she can discuss with the urologist.  She states her pain is starting to return a little, she was given oral Percocet.  That will last longer until she get to the pharmacy.  Review of the [place] shows she has had one prescription in the past 2 years, in March 2018 she got a cough syrup that was controlled.  Final Clinical Impressions(s) /  ED Diagnoses   Final diagnoses:  Left ureteral stone  Renal mass, left  Acute cystitis with hematuria    ED Discharge Orders         Ordered    cephALEXin (KEFLEX) 500 MG capsule  3 times daily     02/06/18 0518    oxyCODONE-acetaminophen (PERCOCET/ROXICET) 5-325 MG tablet  Every 6 hours PRN,   Status:  Discontinued     02/06/18 0518    ondansetron (ZOFRAN) 4 MG tablet  Every 8 hours PRN     02/06/18 0518    tamsulosin (FLOMAX) 0.4 MG CAPS capsule     02/06/18 0518    oxyCODONE-acetaminophen (PERCOCET/ROXICET) 5-325 MG  tablet  Every 6 hours PRN     02/06/18 0520         Plan discharge  Rolland Porter, MD, Barbette Or, MD 02/06/18 0530

## 2018-02-06 NOTE — Discharge Instructions (Signed)
Drink plenty of fluids. Take the medications as prescribed. Return to the ED if you get fever or have uncontrolled vomiting or pain.  Call [facility] for follow-up.  You can either have Dr. Nevada Crane order the MRI of your abdomen to look at the abnormal area on your left kidney or you can discuss it with the urologist when you see them.

## 2018-02-07 LAB — URINE CULTURE
CULTURE: NO GROWTH
SPECIAL REQUESTS: NORMAL

## 2018-02-09 DIAGNOSIS — G43909 Migraine, unspecified, not intractable, without status migrainosus: Secondary | ICD-10-CM | POA: Diagnosis not present

## 2018-02-09 DIAGNOSIS — E782 Mixed hyperlipidemia: Secondary | ICD-10-CM | POA: Diagnosis not present

## 2018-02-09 DIAGNOSIS — N200 Calculus of kidney: Secondary | ICD-10-CM | POA: Diagnosis not present

## 2018-02-09 DIAGNOSIS — K219 Gastro-esophageal reflux disease without esophagitis: Secondary | ICD-10-CM | POA: Diagnosis not present

## 2018-02-12 MED FILL — OMEPRAZOLE 40 MG CPDR: 40 | 90 days supply | Qty: 90 | Fill #0

## 2018-03-02 ENCOUNTER — Other Ambulatory Visit (HOSPITAL_COMMUNITY): Payer: Self-pay | Admitting: Internal Medicine

## 2018-03-02 ENCOUNTER — Ambulatory Visit (HOSPITAL_COMMUNITY)
Admission: RE | Admit: 2018-03-02 | Discharge: 2018-03-02 | Disposition: A | Discharge status: Home / Self Care | Payer: 59 | Source: Ambulatory Visit | Attending: Internal Medicine | Admitting: Internal Medicine

## 2018-03-02 DIAGNOSIS — Z1231 Encounter for screening mammogram for malignant neoplasm of breast: Secondary | ICD-10-CM | POA: Insufficient documentation

## 2018-03-02 DIAGNOSIS — R921 Mammographic calcification found on diagnostic imaging of breast: Secondary | ICD-10-CM

## 2018-03-10 ENCOUNTER — Telehealth: Payer: 59 | Admitting: Physician Assistant

## 2018-03-10 DIAGNOSIS — R079 Chest pain, unspecified: Secondary | ICD-10-CM | POA: Diagnosis not present

## 2018-03-10 DIAGNOSIS — R0602 Shortness of breath: Secondary | ICD-10-CM | POA: Diagnosis not present

## 2018-03-10 DIAGNOSIS — R05 Cough: Secondary | ICD-10-CM

## 2018-03-10 DIAGNOSIS — R059 Cough, unspecified: Secondary | ICD-10-CM

## 2018-03-10 NOTE — Progress Notes (Signed)
Based on what you shared with me it looks like you have a serious condition that should be evaluated in a face to face office visit.  NOTE: If you entered your credit card information for this eVisit, you will not be charged. You may see a "hold" on your card for the $30 but that hold will drop off and you will not have a charge processed.  If you are having a true medical emergency please call 911.  If you need an urgent face to face visit, [facility] has four urgent care centers for your convenience.  If you need care fast and have a high deductible or no insurance consider: ?  [email or web address] to reserve your spot online an avoid wait times  [facility] [address] [place], [place] [address] 8 am to 8 pm Monday-Friday 10 am to 4 pm Saturday-Sunday *Across the street from [employer]  [place], [address] 8 am to 5 pm Monday-Friday * In the [facility] on the [facility]   The following sites will take your insurance:  [facility] a Provider at this Location  [place], [place] [address] 10 am to 8 pm Monday-Friday 12 pm to 8 pm [facility] [place] a Provider at this Location  [place], [place], [place] [address] 8 am to 8 pm Monday-Friday 9 am to 6 pm Saturday 11 am to 6 pm Sunday   [facility] at [facility]  [phone] Get Driving Directions  [address] [place], [place] [address] 8 am to 8 pm Monday-Friday 8 am to 4 pm Saturday-Sunday   Your e-visit answers were reviewed by a board certified advanced clinical practitioner to complete your personal care plan.  Thank you for using e-Visits.  ===View-only below this line===   ----- Message -----    From:  Lorraine Peters    Sent: 03/10/2018 12:20 PM EST      To: E-Visit Mailing List Subject: E-Visit Submission: Flu Like Symptoms  E-Visit Submission: Flu Like Symptoms --------------------------------  Question: How long have you had flu like symptoms? Answer:   less than 48 hours  Question: Do you have a cough or sore throat? Answer:   I have both a cough and a sore throat  Question: Are you in close contact with anyone who has similar symptoms ? Answer:   Yes  Question: Do you have a fever? Answer:   No, I do not have a fever  Question: Are you short of breath? Answer:   Yes  Question: Do you have constant pain in your chest or abdomen? Answer:   Yes  Question: Are you able to keep liquids down? Answer:   Yes  Question: Have you experienced a seizure or loss of consciousness? Answer:   No  Question: Do you have a headache? Answer:   Yes  Question: Is there a rash? Answer:   No  Question: Are you over the age of 58? Answer:   No  Question: Are you treated for any of the following conditions: Asthma, COPD, diabetes, renal failure on dialysis, AIDS, any neuromuscular disease that effects the clearing of secretions heart failure or heart disease? Answer:   No  Question: Describe your sore throat: Answer:   Just when i wake up. The  sore throat eases after im up for a little bit.  Question: How long have you had a sore throat? Answer:   2 days  Question: Do you have any tenderness or swelling in your neck? Answer:   No  Question: Have you received recent chemotherapy or are you taking a drug that may reduce your ability to fight infections for psoriasis, arthritis, or any other autoimmune condition? Answer:   No  Question: Do you have close contact with anyone who has been diagnosed with any of the conditions listed above? Answer:   No  Question: Are you pregnant? Answer:   I am confident that I am not pregnant  Question: Are you breastfeeding? Answer:    No  Question: Are your symptoms severe enough that you think or someone has told you that you need to see a physician urgently? Answer:   No  Question: Are you allergic to Zanamivir or Oseltamivir (Tamiflu)? Answer:   No  Question: Are there children in your family or household that are under the age of 11? Answer:   No  Question: Please list your medication allergies that you may have ? (If 'none' , please list as 'none') Answer:   None  Question: Please list any additional comments  Answer:   Stuffy head. Sneezing. Chest congestion. Head congestion. Headache. Short of breath. I work at [employer] so I think Ive picked it up.

## 2018-03-11 HISTORY — PX: BREAST LUMPECTOMY: SHX2

## 2018-03-14 ENCOUNTER — Ambulatory Visit (HOSPITAL_COMMUNITY)
Admission: EM | Admit: 2018-03-14 | Discharge: 2018-03-14 | Disposition: A | Discharge status: Home / Self Care | Payer: 59 | Attending: Family Medicine | Admitting: Family Medicine

## 2018-03-14 ENCOUNTER — Encounter (HOSPITAL_COMMUNITY): Payer: Self-pay | Admitting: Emergency Medicine

## 2018-03-14 DIAGNOSIS — R030 Elevated blood-pressure reading, without diagnosis of hypertension: Secondary | ICD-10-CM | POA: Diagnosis not present

## 2018-03-14 DIAGNOSIS — R05 Cough: Secondary | ICD-10-CM | POA: Insufficient documentation

## 2018-03-14 DIAGNOSIS — R69 Illness, unspecified: Secondary | ICD-10-CM | POA: Diagnosis not present

## 2018-03-14 DIAGNOSIS — R059 Cough, unspecified: Secondary | ICD-10-CM

## 2018-03-14 DIAGNOSIS — J111 Influenza due to unidentified influenza virus with other respiratory manifestations: Secondary | ICD-10-CM | POA: Diagnosis not present

## 2018-03-14 DIAGNOSIS — R062 Wheezing: Secondary | ICD-10-CM | POA: Insufficient documentation

## 2018-03-14 MED ORDER — PREDNISONE 10 MG (21) PO TBPK: ORAL_TABLET | Freq: Every day | ORAL | 0 refills | Status: DC

## 2018-03-14 MED ORDER — BENZONATATE 100 MG PO CAPS: 100.0000 mg | ORAL_CAPSULE | Freq: Three times a day (TID) | ORAL | 0 refills | Status: DC

## 2018-03-14 NOTE — Discharge Instructions (Signed)

## 2018-03-14 NOTE — ED Triage Notes (Signed)
Pt c/o coughing x4 days, wheezing, sob, sore ribs.

## 2018-03-14 NOTE — ED Provider Notes (Signed)
[place]   [id number] 03/14/18 Arrival Time: [place]:  1. Influenza-like illness   2. Cough   3. Wheezing   4. Elevated blood pressure reading in office without diagnosis of hypertension    Clinical Course as of Mar 14 1101  Sat Mar 14, 2018  1100 Discussed. Recommend f/u Dr Nevada Crane to discuss.  BP(!): 159/97 [BH]    Clinical Course User Index [BH] Vanessa Kick, MD   See AVS for d/c instructions.  Meds ordered this encounter  Medications  . predniSONE (STERAPRED UNI-PAK 21 TAB) 10 MG (21) TBPK tablet    Sig: Take by mouth daily. Take as directed.    Dispense:  21 tablet    Refill:  0  . benzonatate (TESSALON) 100 MG capsule    Sig: Take 1 capsule (100 mg total) by mouth every 8 (eight) hours.    Dispense:  21 capsule    Refill:  0   Work note given. Discussed typical duration of symptoms. OTC symptom care as needed. Ensure adequate fluid intake and rest. May f/u with PCP or here as needed.  Reviewed expectations re: course of current medical issues. Questions answered. Outlined signs and symptoms indicating need for more acute intervention. Patient verbalized understanding. After Visit Summary given.   SUBJECTIVE: History from: patient.  Lorraine Peters is a 48 y.o. female who presents with complaint of nasal congestion, post-nasal drainage, and a persistent dry cough; with mild sore throat. Onset abrupt, about 4 days ago. Overall with fatigue and with body aches. SOB: none. Wheezing: mild to moderate when present, esp at night with coughing fits. Sore ribs from coughing. Albuterol inhaler provides temporary relief. Fever: questions "low grade" without chills. Overall normal PO intake without n/v. Known sick contacts: yes, but works at [employer]. No specific or significant aggravating or alleviating factors reported. OTC treatment: none reported.  Received flu shot this year: no.  Social History   Tobacco Use  Smoking Status Current  Every Day Smoker  . Packs/day: 0.50  . Years: 28.00  . Pack years: 14.00  . Types: Cigarettes  Smokeless Tobacco Never Used   Also notes elevated BP. Mild elevation over the past two visits listed in Epic. No headaches/CP/LE edema/visual changes reported. No h/o HTN diagnosis.  ROS: As per HPI. All other systems negative.   OBJECTIVE:  Vitals:   03/14/18 1038  BP: (!) 159/97  Pulse: 94  Resp: 16  Temp: 97.9 F (36.6 C)  SpO2: 95%    General appearance: alert; appears fatigued HEENT: nasal congestion; clear runny nose; throat irritation secondary to post-nasal drainage Neck: supple without LAD CV: RRR without murmer Lungs: unlabored respirations, symmetrical air entry with mild bilateral expiratory wheezing; cough: moderate Abd: soft Ext: no LE edema Skin: warm and dry Psychological: alert and cooperative; normal mood and affect  Allergies  Allergen Reactions  . Bee Venom Swelling    Severe swelling   Past Medical History:  Diagnosis Date  . Hypercholesterolemia   . Kidney stones   . Migraine   . Ovarian cyst   . Reflux    Family History  Problem Relation Age of Onset  . Arthritis Mother   . Cancer Mother        breast  . Breast cancer Mother 31  . COPD Father   . Heart disease Father   . Hyperlipidemia Father   . Hypertension Father   . Cancer Sister 62  anal, deceased due to mva but had advanced disease  . Stroke Maternal Grandfather   . Cancer Maternal Aunt        breast  . Breast cancer Maternal Aunt   . Cancer Maternal Uncle        breast  . Breast cancer Maternal Uncle    Social History   Socioeconomic History  . Marital status: Divorced    Spouse name: Not on file  . Number of children: Not on file  . Years of education: Not on file  . Highest education level: Not on file  Occupational History  . Not on file  Social Needs  . Financial resource strain: Not on file  . Food insecurity:    Worry: Not on file    Inability: Not on  file  . Transportation needs:    Medical: Not on file    Non-medical: Not on file  Tobacco Use  . Smoking status: Current Every Day Smoker    Packs/day: 0.50    Years: 28.00    Pack years: 14.00    Types: Cigarettes  . Smokeless tobacco: Never Used  Substance and Sexual Activity  . Alcohol use: No    Alcohol/week: 0.0 standard drinks  . Drug use: No  . Sexual activity: Yes    Birth control/protection: Surgical  Lifestyle  . Physical activity:    Days per week: Not on file    Minutes per session: Not on file  . Stress: Not on file  Relationships  . Social connections:    Talks on phone: Not on file    Gets together: Not on file    Attends religious service: Not on file    Active member of club or organization: Not on file    Attends meetings of clubs or organizations: Not on file    Relationship status: Not on file  . Intimate partner violence:    Fear of current or ex partner: Not on file    Emotionally abused: Not on file    Physically abused: Not on file    Forced sexual activity: Not on file  Other Topics Concern  . Not on file  Social History Narrative  . Not on file           Vanessa Kick, MD 03/14/18 1112

## 2018-03-18 MED FILL — RIZATRIPTAN BENZOATE 10 MG: 10 | 25 days supply | Qty: 8 | Fill #0

## 2018-03-18 MED FILL — ATORVASTATIN CALCIUM 20 MG: 20 | 60 days supply | Qty: 60 | Fill #0

## 2018-03-26 ENCOUNTER — Other Ambulatory Visit (HOSPITAL_COMMUNITY): Payer: Self-pay | Admitting: Internal Medicine

## 2018-03-26 DIAGNOSIS — R0982 Postnasal drip: Secondary | ICD-10-CM | POA: Diagnosis not present

## 2018-03-26 DIAGNOSIS — H6990 Unspecified Eustachian tube disorder, unspecified ear: Secondary | ICD-10-CM | POA: Diagnosis not present

## 2018-03-26 DIAGNOSIS — R921 Mammographic calcification found on diagnostic imaging of breast: Secondary | ICD-10-CM

## 2018-03-31 ENCOUNTER — Ambulatory Visit (HOSPITAL_COMMUNITY)
Admission: RE | Admit: 2018-03-31 | Discharge: 2018-03-31 | Disposition: A | Discharge status: Home / Self Care | Payer: 59 | Source: Ambulatory Visit | Attending: Internal Medicine | Admitting: Internal Medicine

## 2018-03-31 DIAGNOSIS — R921 Mammographic calcification found on diagnostic imaging of breast: Secondary | ICD-10-CM | POA: Insufficient documentation

## 2018-04-01 ENCOUNTER — Ambulatory Visit: Payer: 59 | Admitting: Urology

## 2018-04-06 ENCOUNTER — Other Ambulatory Visit: Payer: Self-pay | Admitting: Adult Health Nurse Practitioner

## 2018-04-06 DIAGNOSIS — R921 Mammographic calcification found on diagnostic imaging of breast: Secondary | ICD-10-CM

## 2018-04-13 ENCOUNTER — Ambulatory Visit
Admission: RE | Admit: 2018-04-13 | Discharge: 2018-04-13 | Disposition: A | Discharge status: Home / Self Care | Payer: 59 | Source: Ambulatory Visit | Attending: Adult Health Nurse Practitioner | Admitting: Adult Health Nurse Practitioner

## 2018-04-13 DIAGNOSIS — R921 Mammographic calcification found on diagnostic imaging of breast: Secondary | ICD-10-CM

## 2018-04-13 DIAGNOSIS — D0512 Intraductal carcinoma in situ of left breast: Secondary | ICD-10-CM | POA: Diagnosis not present

## 2018-04-21 ENCOUNTER — Other Ambulatory Visit (HOSPITAL_COMMUNITY): Payer: Self-pay | Admitting: General Surgery

## 2018-04-21 ENCOUNTER — Ambulatory Visit (INDEPENDENT_AMBULATORY_CARE_PROVIDER_SITE_OTHER): Payer: 59 | Admitting: General Surgery

## 2018-04-21 ENCOUNTER — Encounter: Payer: Self-pay | Admitting: General Surgery

## 2018-04-21 VITALS — BP 164/98 | HR 86 | Temp 98.6°F | Resp 18 | Wt 213.0 lb

## 2018-04-21 DIAGNOSIS — D0512 Intraductal carcinoma in situ of left breast: Secondary | ICD-10-CM | POA: Diagnosis not present

## 2018-04-21 NOTE — Patient Instructions (Addendum)
Ductal Carcinoma In Situ  Ductal carcinoma in situ is the presence of abnormal cells in the breast. It is the earliest form of breast cancer. The abnormal cells are located only in the tubes that carry milk to the nipple (milk ducts) and have not spread to other areas. What are the causes? The exact cause of ductal carcinoma in situ is not known. What increases the risk? The following factors increase the risk of developing ductal carcinoma in situ:  Being older than 50 years of age.  Being female.  Having a family history of breast cancer.  Current or past hormone use, such as: ? Using birth control. ? Taking hormone therapy after menopause.  Starting menopause after age 42.  A personal history of: ? Breast cancer. ? Dense breasts. ? Radiation treatments to the breasts or chest area. ? Having the BRCA1 and BRCA2 genes.  Drinking more than 1 alcoholic beverage a day.  Starting your menstrual periods before age 66.  Having never been pregnant or having your first child after age 1.  Having never breastfed.  Having an inactive (sedentary) lifestyle.  Exposure to the drug DES, which was given to pregnant women from the 1940s to the 1970s. What are the signs or symptoms? Ductal carcinoma in situ does not cause any symptoms. How is this diagnosed? Ductal carcinoma in situ is usually discovered during a routine X-ray of the breasts to check for abnormal changes (mammogram). To diagnose the condition, your health care provider may do an ultrasound and remove a tissue sample from your breast so it can be examined under a microscope (breast biopsy). Your health care provider may also remove one or more lymph nodes from under your arm to check if the abnormal cells have spread to your lymph nodes (sentinel lymph node biopsy). Lymph nodes are part of the body's disease-fighting (immune) system. They are located throughout the body. The lymph nodes under the arms are usually the first  place where abnormal cells spread. How is this treated? Ductal carcinoma in situ treatment may include:  A lumpectomy. This is surgery to remove the area of abnormal cells, along with a ring of normal tissue. This may also be called breast-conserving surgery.  Simple mastectomy. This is surgery to remove breast tissue, the nipple, and the circle of colored tissue around the nipple (areola). Sometimes, one or more lymph nodes from under the arm are also removed and tested for cancer cells.  Preventive mastectomy. This is the removal of both breasts. This is usually done only if you have a very high risk of developing breast cancer.  Radiation. This is the use of high-energy rays to kill cancer cells.  Medicines (hormone therapy) to keep the abnormal cells from spreading. Follow these instructions at home:  Take over-the-counter and prescription medicines only as told by your health care provider.  Eat a healthy diet. A healthy diet includes lots of fruits and vegetables, low-fat dairy products, lean meats, and fiber. ? Make sure half your plate is filled with fruits or vegetables. ? Choose high-fiber foods such as whole-grain breads and cereals.  Limit alcohol intake to no more than 1 drink a day for women (no drinks if you are pregnant) and 2 drinks a day for men. One drink equals 12 oz of beer, 5 oz of wine, or 1 oz of hard liquor.  Do not use any products that contain nicotine or tobacco, such as cigarettes and e-cigarettes. If you need help quitting, ask your health  care provider.  Keep all follow-up visits as told by your health care provider. This is important. Where to find more information  American Cancer Society: www.cancer.[place]: www.cancer.gov Contact a health care provider if:  You have a fever.  You notice a new lump in either breast or under your arm.  You have any symptoms or changes that concern you. Get help right away if:  You have  chest pain or trouble breathing. Summary  Ductal carcinoma in situ is the presence of abnormal cells in the breast. It is the earliest form of breast cancer.  The exact cause of ductal carcinoma in situ is not known. The risk increases with age and with current or past hormone use.  To diagnose the condition, your health care provider will remove a tissue sample from your breast so it can be examined under a microscope (breast biopsy). This information is not intended to replace advice given to you by your health care provider. Make sure you discuss any questions you have with your health care provider. Document Released: 09/22/2013 Document Revised: 12/03/2016 Document Reviewed: 12/03/2016 Elsevier Interactive Patient Education  2019 [employer]  A lumpectomy, sometimes called a partial mastectomy, is surgery to remove a cancerous tumor or mass (the lump) from a breast. It is a form of "breast conserving" or "breast preservation" surgery. This means that the cancerous tissue is removed but the breast remains intact. During a lumpectomy, the portion of the breast that contains the tumor is removed. Some normal tissue around the lump may be taken out to make sure that all of the tumor has been removed. Lymph nodes under your arm may also be removed and tested to find out if the cancer has spread. Lymph nodes are part of the body's disease-fighting system (immune system) and are usually the first place where breast cancer spreads. Tell a health care provider about: Any allergies you have. All medicines you are taking, including vitamins, herbs, eye drops, creams, and over-the-counter medicines. Any problems you or family members have had with anesthetic medicines. Any blood disorders you have. Any surgeries you have had. Any medical conditions you have. Whether you are pregnant or may be pregnant. What are the risks? Generally, this is a safe procedure. However, problems may  occur, including: Bleeding. Infection. Allergic reaction to medicines. Pain, swelling, weakness, or numbness in the arm on the side of your surgery. Temporary swelling. Change in the shape of the breast, particularly if a large portion is removed. Scar tissue that forms at the surgical site and feels hard to the touch. Medicines Ask your health care provider about: Changing or stopping your regular medicines. This is especially important if you are taking diabetes medicines or blood thinners. Taking medicines such as aspirin and ibuprofen. These medicines can thin your blood. Do not take these medicines before your procedure if your health care provider instructs you not to. You may be given antibiotic medicine to help prevent infection. General instructions Prior to surgery, your health care provider may do a procedure to locate and mark the tumor area in your breast (localization). This procedure will help guide your surgeon to where the incision will be made. This may be done with: Imaging. This may include a mammogram, ultrasound, or MRI. Insertion of a small wire, clip, seed, or radar reflector implant. You may be screened for extra fluid around the lymph nodes (lymphedema). Ask your health care provider how your surgical site will be  marked or identified. Plan to have someone take you home from the hospital or clinic. What happens during the procedure?  To lower your risk of infection: Your health care team will wash or sanitize their hands. Your skin will be washed with soap. An IV will be inserted into one of your veins. You will be given one or more of the following: A medicine to help you relax (sedative). A medicine to numb the area (local anesthetic). A medicine to make you fall asleep (general anesthetic). Your health care provider will use a kind of electric scalpel that uses heat to minimize bleeding (electrocautery knife). A curved incision that follows the natural curve  of your breast will be made. This type of incision will allow for minimal scarring and better healing. The tumor will be removed along with some of the surrounding tissue. This will be sent to the lab for testing. Your health care provider may also remove lymph nodes at this time if needed. If the tumor is close to the muscles over your chest, some muscle tissue may also be removed. A small drain tube may be inserted into your breast area or armpit to collect fluid that may build up after surgery. This tube will be connected to a suction bulb on the outside of your body to remove the fluid. The incision will be closed with stitches (sutures). A bandage (dressing) may be placed over the incision. The procedure may vary among health care providers and hospitals. What happens after the procedure? Your blood pressure, heart rate, breathing rate, and blood oxygen level will be monitored until you leave the hospital or clinic. You will be given medicine for pain as needed. Your IV will be removed when you are able to eat and drink by mouth. You will be encouraged to get up and walk as soon as you can. This is important to improve blood flow and breathing. Ask for help if you feel weak or unsteady. You may have a drain tube in place for 2-3 days to prevent a collection of blood (hematoma) from developing in the breast. You will be given instructions about caring for the drain before you go home. A pressure bandage may be applied for 1-2 days to prevent bleeding or swelling. Your pressure bandage may look like a thick piece of fabric or an elastic wrap. Ask your health care provider how to care for your bandage at home. You may be given a tight sleeve to wear over your arm on the side of your surgery. You should wear this sleeve as told by your health care provider. Do not drive for 24 hours if you were given a sedative during your procedure. Summary A lumpectomy, sometimes called a partial mastectomy, is  surgery to remove a cancerous tumor or mass (the lump) from a breast. During a lumpectomy, the portion of the breast that contains the tumor is removed. Some normal tissue around the lump may be taken out to make sure that all of the tumor has been removed. Lymph nodes under your arm may also be removed and tested to find out if the cancer has spread. You may have a drain tube in place for 2-3 days to prevent a collection of blood (hematoma) from developing in the breast. You will be given instructions about caring for the drain before you go home. Plan to have someone take you home from the hospital or clinic. This information is not intended to replace advice given to you by your  health care provider. Make sure you discuss any questions you have with your health care provider. Document Released: 04/08/2006 Document Revised: 08/26/2017 Document Reviewed: 11/08/2015 Elsevier Interactive Patient Education  2019 [employer].

## 2018-04-21 NOTE — Progress Notes (Addendum)
[facility] History and Physical  Reason for Referral: Ductal Carcinoma In Situ, left breast  Referring Physician:  Dr. Nevada Crane (PCP), [facility]    Lorraine Peters is a 48 y.o. female.  HPI: Lorraine Peters is a 48 yo with a newly diagnosed DCIS on the left breast. Lorraine Peters had Lorraine Peters normal screening mammogram in December and there were some calcifications that were subsequently biopsied and demonstrated DCIS that was high grade with some central necrosis.    The patient has no history of any masses, lumps, bumps, nipple changes or discharge. Lorraine Peters had menarche at age 33. Lorraine Peters is G1P1. Lorraine Peters did not breastfeed Lorraine Peters children.  Lorraine Peters has a history of any family breast cancer in Lorraine Peters mother who developed breast cancer and then a recurrence. The specifics to this are unclear but it sounds like Lorraine Peters had a partial mastectomy, no radiation, and chemotherapy, and then ended up getting bilateral mastectomies during the recurrence.  Lorraine Peters also has a maternal aunt that had breast cancer. Lorraine Peters is currently peri-menopausal and has been having sporadic periods.  Lorraine Peters has never had any previous biopsies or concerning areas on mammogram.  Lorraine Peters has not had any chest radiation.  Lorraine Peters has done well since Lorraine Peters biopsy and reports no major issues.   Past Medical History:  Diagnosis Date  . Hypercholesterolemia   . Kidney stones   . Migraine   . Ovarian cyst   . Reflux     Past Surgical History:  Procedure Laterality Date  . ESOPHAGOGASTRODUODENOSCOPY N/A 09/12/2017   Procedure: ESOPHAGOGASTRODUODENOSCOPY (EGD);  Surgeon: Daneil Dolin, MD;  Location: [facility];  Service: Endoscopy;  Laterality: N/A;  2:30pm  . TUBAL LIGATION      Family History  Problem Relation Age of Onset  . Arthritis Mother   . Cancer Mother        breast  . Breast cancer Mother 15  . COPD Father   . Heart disease Father   . Hyperlipidemia Father   . Hypertension Father   . Cancer Sister 31       anal, deceased due to mva but had  advanced disease  . Stroke Maternal Grandfather   . Cancer Maternal Aunt        breast  . Breast cancer Maternal Aunt   . Cancer Maternal Uncle        breast  . Breast cancer Maternal Uncle     Social History   Tobacco Use  . Smoking status: Current Every Day Smoker    Packs/day: 0.50    Years: 28.00    Pack years: 14.00    Types: Cigarettes  . Smokeless tobacco: Never Used  Substance Use Topics  . Alcohol use: No    Alcohol/week: 0.0 standard drinks  . Drug use: No    Medications: Lorraine Peters have reviewed the patient's current medications. Allergies as of 04/21/2018      Reactions   Bee Venom Swelling   Severe swelling      Medication List       Accurate as of April 21, 2018 10:48 AM. Always use your most recent med list.        acetaminophen 500 MG tablet Commonly known as:  TYLENOL Take 1,000 mg by mouth every 6 (six) hours as needed.   albuterol 108 (90 Base) MCG/ACT inhaler Commonly known as:  PROVENTIL HFA;VENTOLIN HFA Inhale 2 puffs into the lungs every 6 (six) hours as needed for wheezing or shortness of breath.  albuterol 108 (90 Base) MCG/ACT inhaler Commonly known as:  PROVENTIL HFA;VENTOLIN HFA Inhale 2 puffs into the lungs every 6 (six) hours as needed for wheezing or shortness of breath.   atorvastatin 20 MG tablet Commonly known as:  LIPITOR TAKE 1 Tablet BY MOUTH ONCE DAILY   benzonatate 100 MG capsule Commonly known as:  TESSALON Take 1 capsule (100 mg total) by mouth every 8 (eight) hours.   dicyclomine 20 MG tablet Commonly known as:  BENTYL Take 1 tablet (20 mg total) by mouth 2 (two) times daily as needed for spasms.   MAXALT 10 MG tablet Generic drug:  rizatriptan TAKE 1 TABLET BY MOUTH AT ONSET OF HEADACHE, MAY REPEAT IN 2 HOURS. MAX 2 TABLETS IN 24 HOURS.   omeprazole 40 MG capsule Commonly known as:  PRILOSEC Take 40 mg by mouth daily.   sucralfate 1 g tablet Commonly known as:  CARAFATE Take 1 tablet (1 g total) by mouth 4  (four) times daily -  with meals and at bedtime.   tamsulosin 0.4 MG Caps capsule Commonly known as:  FLOMAX Take 1 po QD until you pass the stone.        ROS:  A comprehensive review of systems was negative except for: Constitutional: positive for headaches Ears, nose, mouth, throat, and face: positive for sinus problems Gastrointestinal: positive for reflux symptoms  Blood pressure (!) 164/98, pulse 86, temperature 98.6 F (37 C), temperature source Temporal, resp. rate 18, weight 213 lb (96.6 kg). Physical Exam Vitals signs reviewed.  Constitutional:      Appearance: Normal appearance.  HENT:     Head: Normocephalic.     Nose: Nose normal.  Eyes:     Extraocular Movements: Extraocular movements intact.     Pupils: Pupils are equal, round, and reactive to light.  Neck:     Musculoskeletal: Normal range of motion. No neck rigidity.  Cardiovascular:     Rate and Rhythm: Normal rate and regular rhythm.  Pulmonary:     Effort: Pulmonary effort is normal.     Breath sounds: Normal breath sounds.  Chest:     Breasts:        Right: Normal. No inverted nipple, mass, nipple discharge or skin change.        Left: No inverted nipple or nipple discharge.     Comments: Left breast biopsy site laterally with some bruising, minor induration and tenderness Abdominal:     General: There is no distension.     Palpations: Abdomen is soft.     Tenderness: There is no abdominal tenderness.  Musculoskeletal: Normal range of motion.        General: No swelling.  Lymphadenopathy:     Upper Body:     Right upper body: No supraclavicular, axillary or pectoral adenopathy.     Left upper body: No supraclavicular, axillary or pectoral adenopathy.  Skin:    General: Skin is warm and dry.  Neurological:     General: No focal deficit present.     Mental Status: Lorraine Peters is alert and oriented to person, place, and time.  Psychiatric:        Mood and Affect: Mood normal.        Behavior: Behavior  normal.        Thought Content: Thought content normal.        Judgment: Judgment normal.    Results: Screening Mammogram 02/2018 IMPRESSION: Further evaluation is suggested for calcifications in the left breast.  RECOMMENDATION: Diagnostic  mammogram of the left breast. (Code:FI-L-37M)  The patient will be contacted regarding the findings, and additional imaging will be scheduled.  BI-RADS CATEGORY  0: Incomplete. Need additional imaging evaluation and/or prior mammograms for comparison.  Diagnostic Mammogram 03/31/2018 RECOMMENDATION: Stereotactic guided biopsy of 7-8 mm group of calcifications in the upper-outer left breast.  Lorraine Peters have discussed the findings and recommendations with the patient. Results were also provided in writing at the conclusion of the visit. If applicable, a reminder letter will be sent to the patient regarding the next appointment.  BI-RADS CATEGORY  4: Suspicious.  Stereotactic Biopsy 04/13/2018: ADDENDUM REPORT: 04/15/2018 08:48  ADDENDUM: Pathology revealed HIGH GRADE DUCTAL CARCINOMA IN SITU with central necrosis of the Left breast, upper outer quadrant. This was found to be concordant by Dr. Abelardo Diesel.  Pathology results were discussed with the patient by telephone. The patient reported doing well after the biopsy with tenderness at the site. Post biopsy instructions and care were reviewed and questions were answered. The patient was encouraged to call [facility] for any additional concerns.  Surgical consultation has been arranged with Dr. Curlene Labrum at [facility] in [place], [place] on April 21, 2018.  Pathology results reported by Terie Purser, RN on 04/15/2018.  Electronically Signed   By: Abelardo Diesel M.D.   On: 04/15/2018 08:48  Post Biopsy Mammogram: EXAM: DIAGNOSTIC LEFT MAMMOGRAM POST STEREOTACTIC BIOPSY  COMPARISON:  Previous  exam(s).  FINDINGS: Mammographic images were obtained following left breast stereotactic guided biopsy of calcifications in the posterior upper-outer quadrant left breast. Cc and lateral view of the left breast demonstrate coil biopsy clip migrated 4.4 cm medial to the biopsy site. Residual calcification is identified at the biopsy site.  IMPRESSION: Post biopsy mammogram as described.  Pathology: Diagnosis Breast, left, needle core biopsy, UOQ - DUCTAL CARCINOMA IN SITU.  Assessment & Plan:  TARAE WOODEN is a 48 y.o. female with a newly diagnosed DCIS of the left breast with concerning features with the high grade and central necrosis. The imaging is reported to be concordant to the pathology. The area is also very lateral on the breast from the prior biopsy.  We have discussed carcinoma in situ and the fact that this is very early stage cancer and that a sentinel node biopsy does not need to be performed in most cases. We have discussed that given the location and given Lorraine Peters concerning features and possibility that an invasive cancer could be present that we could go ahead and perform the sentinel node since the drainage could change after we excise the area given the proximity to the axilla.   We have discussed the options for surgery for DCIS and the reasons behind these options. The options included the option of mastectomy with sentinel node without any radiation versus partial mastectomy (lumpectomy) with radiation. We discussed again that normally for DCIS a sentinel node is only done if there are concerning features or if you do a mastectomy, and Lorraine Peters does qualify for sentinel node for the concerning features and location. We have discussed that there is no difference in the prognosis or chance or recurrence or differences in survival between the two options. We have discussed the need for radiation with the lumpectomy, and we have discussed that Lorraine Peters will be referred to oncology after  our procedure to further discuss any need for additional treatments.   We have discussed that if Lorraine Peters decides to have a lumpectomy that we will need  to get a needle placed into the area where the biopsy was performed, since we cannot palpate a mass. We have also discussed the need for injection of radiotracer and blue dye to perform the sentinel node biopsy. We have discussed that the sentinel node biopsy tells [place] if the cancer has spread to the lymph nodes, and can help with plans for chemotherapy treatment and overall prognosis.  We discussed that with Lorraine Peters pathology we do not have evidence that it has the potential at this time since we only have DCIS but the sentinel node is more precautionary and to prevent additional procedures pending any invasive cancer being found.   We have discussed that if the lumpectomy does not remove the entire cancer that Lorraine Peters may have to have an additional procedure, and we have discussed that a positive sentinel node can require further removal of lymph nodes from the axilla but that recent research does not show any improvement in disease free survival and carries greater risk for lymphedema.    We have discussed that these are big discussions, and that the risk from the operations are similar including risk of bleeding, risk of infection, and risk of needing additional surgeries.   Lorraine Peters discussed the case with Dr. Thornton Papas given the report of the biopsy clip moving on the post biopsy mammogram. The mammography radiologist was there and Dr. Thornton Papas reviewed the images with them, and he feels that he will be able to locate the site of the DCIS via the residual calcifications. The biopsy clips will not be located and is not expected to be in the specimen.  Have informed the patient of the migration of the clip.   All questions were answered to the satisfaction of the patient and family.  Virl Cagey 04/21/2018, 10:48 AM

## 2018-04-22 DIAGNOSIS — D0512 Intraductal carcinoma in situ of left breast: Secondary | ICD-10-CM

## 2018-04-22 NOTE — H&P (Signed)
[facility] History and Physical  Reason for Referral: Ductal Carcinoma In Situ, left breast  Referring Physician:  Dr. Nevada Crane (PCP), [facility]   Lorraine Peters is a 48 y.o. female.  HPI: Lorraine Peters is a 48 yo with a newly diagnosed DCIS on the left breast. She had her normal screening mammogram in December and there were some calcifications that were subsequently biopsied and demonstrated DCIS that was high grade with some central necrosis.    The patient has no history of any masses, lumps, bumps, nipple changes or discharge. She had menarche at age 3. She is G1P1. She did not breastfeed her children.  She has a history of any family breast cancer in her mother who developed breast cancer and then a recurrence. The specifics to this are unclear but it sounds like she had a partial mastectomy, no radiation, and chemotherapy, and then ended up getting bilateral mastectomies during the recurrence.  She also has a maternal aunt that had breast cancer. She is currently peri-menopausal and has been having sporadic periods.  She has never had any previous biopsies or concerning areas on mammogram.  She has not had any chest radiation.  She has done well since her biopsy and reports no major issues.       Past Medical History:  Diagnosis Date  . Hypercholesterolemia   . Kidney stones   . Migraine   . Ovarian cyst   . Reflux          Past Surgical History:  Procedure Laterality Date  . ESOPHAGOGASTRODUODENOSCOPY N/A 09/12/2017   Procedure: ESOPHAGOGASTRODUODENOSCOPY (EGD);  Surgeon: Daneil Dolin, MD;  Location: [facility];  Service: Endoscopy;  Laterality: N/A;  2:30pm  . TUBAL LIGATION           Family History  Problem Relation Age of Onset  . Arthritis Mother   . Cancer Mother        breast  . Breast cancer Mother 64  . COPD Father   . Heart disease Father   . Hyperlipidemia Father   . Hypertension Father   . Cancer Sister 57        anal, deceased due to mva but had advanced disease  . Stroke Maternal Grandfather   . Cancer Maternal Aunt        breast  . Breast cancer Maternal Aunt   . Cancer Maternal Uncle        breast  . Breast cancer Maternal Uncle     Social History        Tobacco Use  . Smoking status: Current Every Day Smoker    Packs/day: 0.50    Years: 28.00    Pack years: 14.00    Types: Cigarettes  . Smokeless tobacco: Never Used  Substance Use Topics  . Alcohol use: No    Alcohol/week: 0.0 standard drinks  . Drug use: No    Medications: I have reviewed the patient's current medications.      Allergies as of 04/21/2018      Reactions   Bee Venom Swelling   Severe swelling         Medication List       Accurate as of April 21, 2018 10:48 AM. Always use your most recent med list.        acetaminophen 500 MG tablet Commonly known as:  TYLENOL Take 1,000 mg by mouth every 6 (six) hours as needed.   albuterol 108 (90 Base) MCG/ACT inhaler Commonly  known as:  PROVENTIL HFA;VENTOLIN HFA Inhale 2 puffs into the lungs every 6 (six) hours as needed for wheezing or shortness of breath.   albuterol 108 (90 Base) MCG/ACT inhaler Commonly known as:  PROVENTIL HFA;VENTOLIN HFA Inhale 2 puffs into the lungs every 6 (six) hours as needed for wheezing or shortness of breath.   atorvastatin 20 MG tablet Commonly known as:  LIPITOR TAKE 1 Tablet BY MOUTH ONCE DAILY   benzonatate 100 MG capsule Commonly known as:  TESSALON Take 1 capsule (100 mg total) by mouth every 8 (eight) hours.   dicyclomine 20 MG tablet Commonly known as:  BENTYL Take 1 tablet (20 mg total) by mouth 2 (two) times daily as needed for spasms.   MAXALT 10 MG tablet Generic drug:  rizatriptan TAKE 1 TABLET BY MOUTH AT ONSET OF HEADACHE, MAY REPEAT IN 2 HOURS. MAX 2 TABLETS IN 24 HOURS.   omeprazole 40 MG capsule Commonly known as:  PRILOSEC Take 40 mg by mouth daily.     sucralfate 1 g tablet Commonly known as:  CARAFATE Take 1 tablet (1 g total) by mouth 4 (four) times daily -  with meals and at bedtime.   tamsulosin 0.4 MG Caps capsule Commonly known as:  FLOMAX Take 1 po QD until you pass the stone.        ROS:  A comprehensive review of systems was negative except for: Constitutional: positive for headaches Ears, nose, mouth, throat, and face: positive for sinus problems Gastrointestinal: positive for reflux symptoms  Blood pressure (!) 164/98, pulse 86, temperature 98.6 F (37 C), temperature source Temporal, resp. rate 18, weight 213 lb (96.6 kg). Physical Exam Vitals signs reviewed.  Constitutional:      Appearance: Normal appearance.  HENT:     Head: Normocephalic.     Nose: Nose normal.  Eyes:     Extraocular Movements: Extraocular movements intact.     Pupils: Pupils are equal, round, and reactive to light.  Neck:     Musculoskeletal: Normal range of motion. No neck rigidity.  Cardiovascular:     Rate and Rhythm: Normal rate and regular rhythm.  Pulmonary:     Effort: Pulmonary effort is normal.     Breath sounds: Normal breath sounds.  Chest:     Breasts:        Right: Normal. No inverted nipple, mass, nipple discharge or skin change.        Left: No inverted nipple or nipple discharge.     Comments: Left breast biopsy site laterally with some bruising, minor induration and tenderness Abdominal:     General: There is no distension.     Palpations: Abdomen is soft.     Tenderness: There is no abdominal tenderness.  Musculoskeletal: Normal range of motion.        General: No swelling.  Lymphadenopathy:     Upper Body:     Right upper body: No supraclavicular, axillary or pectoral adenopathy.     Left upper body: No supraclavicular, axillary or pectoral adenopathy.  Skin:    General: Skin is warm and dry.  Neurological:     General: No focal deficit present.     Mental Status: She is alert and oriented to  person, place, and time.  Psychiatric:        Mood and Affect: Mood normal.        Behavior: Behavior normal.        Thought Content: Thought content normal.  Judgment: Judgment normal.    Results: Screening Mammogram 02/2018 IMPRESSION: Further evaluation is suggested for calcifications in the left breast.  RECOMMENDATION: Diagnostic mammogram of the left breast. (Code:[id number])  The patient will be contacted regarding the findings, and additional imaging will be scheduled.  BI-RADS CATEGORY 0: Incomplete. Need additional imaging evaluation and/or prior mammograms for comparison.  Diagnostic Mammogram 03/31/2018 RECOMMENDATION: Stereotactic guided biopsy of 7-8 mm group of calcifications in the upper-outer left breast.  I have discussed the findings and recommendations with the patient. Results were also provided in writing at the conclusion of the visit. If applicable, a reminder letter will be sent to the patient regarding the next appointment.  BI-RADS CATEGORY 4: Suspicious.  Stereotactic Biopsy 04/13/2018: ADDENDUM REPORT: 04/15/2018 08:48  ADDENDUM: Pathology revealed HIGH GRADE DUCTAL CARCINOMA IN SITU with central necrosis of the Left breast, upper outer quadrant. This was found to be concordant by Dr. Abelardo Diesel.  Pathology results were discussed with the patient by telephone. The patient reported doing well after the biopsy with tenderness at the site. Post biopsy instructions and care were reviewed and questions were answered. The patient was encouraged to call [facility] for any additional concerns.  Surgical consultation has been arranged with Dr. Curlene Labrum at [facility] in [place], [place] on April 21, 2018.  Pathology results reported by Terie Purser, RN on 04/15/2018.  Electronically Signed By: Abelardo Diesel M.D. On: 04/15/2018 08:48  Post Biopsy  Mammogram: EXAM: DIAGNOSTIC LEFT MAMMOGRAM POST STEREOTACTIC BIOPSY  COMPARISON: Previous exam(s).  FINDINGS: Mammographic images were obtained following left breast stereotactic guided biopsy of calcifications in the posterior upper-outer quadrant left breast. Cc and lateral view of the left breast demonstrate coil biopsy clip migrated 4.4 cm medial to the biopsy site. Residual calcification is identified at the biopsy site.  IMPRESSION: Post biopsy mammogram as described.  Pathology: Diagnosis Breast, left, needle core biopsy, UOQ - DUCTAL CARCINOMA IN SITU.  Assessment & Plan:  Lorraine Peters is a 48 y.o. female with a newly diagnosed DCIS of the left breast with concerning features with the high grade and central necrosis. The imaging is reported to be concordant to the pathology. The area is also very lateral on the breast from the prior biopsy.  We have discussed carcinoma in situ and the fact that this is very early stage cancer and that a sentinel node biopsy does not need to be performed in most cases. We have discussed that given the location and given her concerning features and possibility that an invasive cancer could be present that we could go ahead and perform the sentinel node since the drainage could change after we excise the area given the proximity to the axilla.   We have discussed the options for surgery for DCIS and the reasons behind these options. The options included the option of mastectomy with sentinel node without any radiation versus partial mastectomy (lumpectomy) with radiation. We discussed again that normally for DCIS a sentinel node is only done if there are concerning features or if you do a mastectomy, and she does qualify for sentinel node for the concerning features and location. We have discussed that there is no difference in the prognosis or chance or recurrence or differences in survival between the two options. We have discussed the need for  radiation with the lumpectomy, and we have discussed that she will be referred to oncology after our procedure to further discuss any need for additional treatments.  We have discussed that if she decides to have a lumpectomy that we will need to get a needle placed into the area where the biopsy was performed, since we cannot palpate a mass. We have also discussed the need for injection of radiotracer and blue dye to perform the sentinel node biopsy. We have discussed that the sentinel node biopsy tells [place] if the cancer has spread to the lymph nodes, and can help with plans for chemotherapy treatment and overall prognosis.  We discussed that with her pathology we do not have evidence that it has the potential at this time since we only have DCIS but the sentinel node is more precautionary and to prevent additional procedures pending any invasive cancer being found.   We have discussed that if the lumpectomy does not remove the entire cancer that she may have to have an additional procedure, and we have discussed that a positive sentinel node can require further removal of lymph nodes from the axilla but that recent research does not show any improvement in disease free survival and carries greater risk for lymphedema.    We have discussed that these are big discussions, and that the risk from the operations are similar including risk of bleeding, risk of infection, and risk of needing additional surgeries.   I discussed the case with Dr. Thornton Papas given the report of the biopsy clip moving on the post biopsy mammogram. The mammography radiologist was there and Dr. Thornton Papas reviewed the images with them, and he feels that he will be able to locate the site of the DCIS via the residual calcifications. The biopsy clips will not be located and is not expected to be in the specimen.  Have informed the patient of the migration of the clip.   All questions were answered to the satisfaction of the patient and  family.  Virl Cagey 04/21/2018, 10:48 AM

## 2018-04-23 ENCOUNTER — Telehealth: Payer: Self-pay | Admitting: General Surgery

## 2018-04-23 MED ORDER — NICOTINE 21 MG/24HR TD PT24: 21.0000 mg | MEDICATED_PATCH | Freq: Every day | TRANSDERMAL | 0 refills | Status: DC

## 2018-04-23 MED FILL — NICOTINE 21 MG/24HR PATCH: 21 | 28 days supply | Qty: 28 | Fill #0

## 2018-04-23 NOTE — Telephone Encounter (Signed)
Patient requesting nicaderm patch and wants the Rx due to it being free through [facility]. Will send in Rx.  Curlene Labrum, MD [facility] [address] [place], [place] [address] [phone] (office)

## 2018-04-24 ENCOUNTER — Encounter (HOSPITAL_COMMUNITY): Payer: Self-pay

## 2018-04-24 ENCOUNTER — Other Ambulatory Visit: Payer: Self-pay

## 2018-04-27 ENCOUNTER — Encounter (HOSPITAL_COMMUNITY)
Admission: RE | Admit: 2018-04-27 | Discharge: 2018-04-27 | Disposition: A | Discharge status: Home / Self Care | Payer: 59 | Source: Ambulatory Visit | Attending: General Surgery | Admitting: General Surgery

## 2018-04-27 HISTORY — DX: Personal history of urinary calculi: Z87.442

## 2018-04-27 HISTORY — DX: Nausea with vomiting, unspecified: R11.2

## 2018-04-27 HISTORY — DX: Other specified postprocedural states: Z98.890

## 2018-04-27 HISTORY — DX: Other specified postprocedural states: R11.2

## 2018-04-29 ENCOUNTER — Ambulatory Visit (HOSPITAL_COMMUNITY)
Admission: RE | Admit: 2018-04-29 | Discharge: 2018-04-29 | Disposition: A | Discharge status: Home / Self Care | Payer: 59 | Source: Ambulatory Visit | Attending: General Surgery | Admitting: General Surgery

## 2018-04-29 ENCOUNTER — Ambulatory Visit (HOSPITAL_COMMUNITY): Payer: 59 | Admitting: Anesthesiology

## 2018-04-29 ENCOUNTER — Encounter (HOSPITAL_COMMUNITY): Payer: Self-pay

## 2018-04-29 ENCOUNTER — Ambulatory Visit (HOSPITAL_COMMUNITY): Payer: 59

## 2018-04-29 ENCOUNTER — Ambulatory Visit (HOSPITAL_COMMUNITY)
Admission: RE | Admit: 2018-04-29 | Discharge: 2018-04-29 | Disposition: A | Discharge status: Home / Self Care | Payer: 59 | Attending: General Surgery | Admitting: General Surgery

## 2018-04-29 ENCOUNTER — Encounter (HOSPITAL_COMMUNITY)
Admission: RE | Disposition: A | Discharge status: Home / Self Care | Payer: Self-pay | Source: Home / Self Care | Attending: General Surgery

## 2018-04-29 DIAGNOSIS — D0512 Intraductal carcinoma in situ of left breast: Secondary | ICD-10-CM

## 2018-04-29 DIAGNOSIS — N6489 Other specified disorders of breast: Secondary | ICD-10-CM | POA: Insufficient documentation

## 2018-04-29 DIAGNOSIS — K219 Gastro-esophageal reflux disease without esophagitis: Secondary | ICD-10-CM | POA: Diagnosis not present

## 2018-04-29 DIAGNOSIS — Z803 Family history of malignant neoplasm of breast: Secondary | ICD-10-CM | POA: Insufficient documentation

## 2018-04-29 DIAGNOSIS — E7800 Pure hypercholesterolemia, unspecified: Secondary | ICD-10-CM | POA: Diagnosis not present

## 2018-04-29 DIAGNOSIS — G43909 Migraine, unspecified, not intractable, without status migrainosus: Secondary | ICD-10-CM | POA: Insufficient documentation

## 2018-04-29 DIAGNOSIS — Z170 Estrogen receptor positive status [ER+]: Secondary | ICD-10-CM | POA: Diagnosis not present

## 2018-04-29 DIAGNOSIS — Z79899 Other long term (current) drug therapy: Secondary | ICD-10-CM | POA: Diagnosis not present

## 2018-04-29 DIAGNOSIS — C50912 Malignant neoplasm of unspecified site of left female breast: Secondary | ICD-10-CM | POA: Diagnosis not present

## 2018-04-29 DIAGNOSIS — R921 Mammographic calcification found on diagnostic imaging of breast: Secondary | ICD-10-CM | POA: Diagnosis not present

## 2018-04-29 HISTORY — PX: PARTIAL MASTECTOMY WITH NEEDLE LOCALIZATION AND AXILLARY SENTINEL LYMPH NODE BX: SHX6009

## 2018-04-29 SURGERY — PARTIAL MASTECTOMY WITH NEEDLE LOCALIZATION AND AXILLARY SENTINEL LYMPH NODE BX
Anesthesia: General | Laterality: Left

## 2018-04-29 MED ORDER — SODIUM CHLORIDE 0.9% FLUSH
INTRAVENOUS | Status: AC
  Filled 2018-04-29: qty 10

## 2018-04-29 MED ORDER — FENTANYL CITRATE (PF) 250 MCG/5ML IJ SOLN
INTRAMUSCULAR | Status: AC
  Filled 2018-04-29: qty 5

## 2018-04-29 MED ORDER — TECHNETIUM TC 99M SULFUR COLLOID FILTERED
0.5000 | Freq: Once | INTRAVENOUS | Status: AC | PRN
  Administered 2018-04-29: 0.5 via INTRADERMAL

## 2018-04-29 MED ORDER — ONDANSETRON HCL 4 MG/2ML IJ SOLN
INTRAMUSCULAR | Status: DC | PRN
  Administered 2018-04-29: 4 mg via INTRAVENOUS

## 2018-04-29 MED ORDER — PROMETHAZINE HCL 25 MG/ML IJ SOLN
6.2500 mg | INTRAMUSCULAR | Status: DC | PRN
  Administered 2018-04-29: 12.5 mg via INTRAVENOUS

## 2018-04-29 MED ORDER — SUGAMMADEX SODIUM 500 MG/5ML IV SOLN
INTRAVENOUS | Status: DC | PRN
  Administered 2018-04-29: 200 mg via INTRAVENOUS

## 2018-04-29 MED ORDER — DOCUSATE SODIUM 100 MG PO CAPS: 100.0000 mg | ORAL_CAPSULE | Freq: Two times a day (BID) | ORAL | 2 refills | Status: DC

## 2018-04-29 MED ORDER — METHYLENE BLUE 0.5 % INJ SOLN
INTRAVENOUS | Status: AC
  Filled 2018-04-29: qty 10

## 2018-04-29 MED ORDER — SUCCINYLCHOLINE CHLORIDE 200 MG/10ML IV SOSY
PREFILLED_SYRINGE | INTRAVENOUS | Status: AC
  Filled 2018-04-29: qty 20

## 2018-04-29 MED ORDER — CHLORHEXIDINE GLUCONATE CLOTH 2 % EX PADS: 6.0000 | MEDICATED_PAD | Freq: Once | CUTANEOUS | Status: DC

## 2018-04-29 MED ORDER — PROMETHAZINE HCL 25 MG/ML IJ SOLN
INTRAMUSCULAR | Status: AC
  Filled 2018-04-29: qty 1

## 2018-04-29 MED ORDER — SODIUM CHLORIDE (PF) 0.9 % IJ SOLN
INTRAVENOUS | Status: DC | PRN
  Administered 2018-04-29: 1.5 mL via INTRAMUSCULAR

## 2018-04-29 MED ORDER — OXYCODONE HCL 5 MG PO TABS: 5.0000 mg | ORAL_TABLET | ORAL | 0 refills | Status: DC | PRN

## 2018-04-29 MED ORDER — HYDROMORPHONE HCL 1 MG/ML IJ SOLN
0.2500 mg | INTRAMUSCULAR | Status: DC | PRN
  Administered 2018-04-29 (×2): 0.5 mg via INTRAVENOUS

## 2018-04-29 MED ORDER — MIDAZOLAM HCL 2 MG/2ML IJ SOLN: 0.5000 mg | Freq: Once | INTRAMUSCULAR | Status: DC | PRN

## 2018-04-29 MED ORDER — FENTANYL CITRATE (PF) 100 MCG/2ML IJ SOLN
INTRAMUSCULAR | Status: DC | PRN
  Administered 2018-04-29 (×5): 50 ug via INTRAVENOUS

## 2018-04-29 MED ORDER — PENTAFLUOROPROP-TETRAFLUOROETH EX AERO
INHALATION_SPRAY | CUTANEOUS | Status: AC
  Filled 2018-04-29: qty 116

## 2018-04-29 MED ORDER — BUPIVACAINE HCL (PF) 0.5 % IJ SOLN
INTRAMUSCULAR | Status: AC
  Filled 2018-04-29: qty 30

## 2018-04-29 MED ORDER — BUPIVACAINE HCL (PF) 0.5 % IJ SOLN
INTRAMUSCULAR | Status: DC | PRN
  Administered 2018-04-29: 20 mL

## 2018-04-29 MED ORDER — SUCCINYLCHOLINE CHLORIDE 20 MG/ML IJ SOLN
INTRAMUSCULAR | Status: DC | PRN
  Administered 2018-04-29: 120 mg via INTRAVENOUS

## 2018-04-29 MED ORDER — PROPOFOL 10 MG/ML IV BOLUS
INTRAVENOUS | Status: AC
  Filled 2018-04-29: qty 20

## 2018-04-29 MED ORDER — DEXAMETHASONE SODIUM PHOSPHATE 10 MG/ML IJ SOLN
INTRAMUSCULAR | Status: DC | PRN
  Administered 2018-04-29: 10 mg via INTRAVENOUS

## 2018-04-29 MED ORDER — LACTATED RINGERS IV SOLN
INTRAVENOUS | Status: DC
  Administered 2018-04-29: 12:00:00 via INTRAVENOUS

## 2018-04-29 MED ORDER — PROPOFOL 10 MG/ML IV BOLUS
INTRAVENOUS | Status: DC | PRN
  Administered 2018-04-29: 150 mg via INTRAVENOUS
  Administered 2018-04-29: 20 mg via INTRAVENOUS
  Administered 2018-04-29: 30 mg via INTRAVENOUS

## 2018-04-29 MED ORDER — HYDROMORPHONE HCL 1 MG/ML IJ SOLN
INTRAMUSCULAR | Status: AC
  Filled 2018-04-29: qty 0.5

## 2018-04-29 MED ORDER — PHENYLEPHRINE 40 MCG/ML (10ML) SYRINGE FOR IV PUSH (FOR BLOOD PRESSURE SUPPORT)
PREFILLED_SYRINGE | INTRAVENOUS | Status: AC
  Filled 2018-04-29: qty 10

## 2018-04-29 MED ORDER — DEXMEDETOMIDINE HCL IN NACL 200 MCG/50ML IV SOLN
INTRAVENOUS | Status: AC
  Filled 2018-04-29: qty 50

## 2018-04-29 MED ORDER — DEXAMETHASONE SODIUM PHOSPHATE 10 MG/ML IJ SOLN
INTRAMUSCULAR | Status: AC
  Filled 2018-04-29: qty 1

## 2018-04-29 MED ORDER — ROCURONIUM BROMIDE 10 MG/ML (PF) SYRINGE
PREFILLED_SYRINGE | INTRAVENOUS | Status: AC
  Filled 2018-04-29: qty 10

## 2018-04-29 MED ORDER — ONDANSETRON HCL 4 MG/2ML IJ SOLN
INTRAMUSCULAR | Status: AC
  Filled 2018-04-29: qty 2

## 2018-04-29 MED ORDER — CEFAZOLIN SODIUM-DEXTROSE 2-4 GM/100ML-% IV SOLN
2.0000 g | INTRAVENOUS | Status: AC
  Administered 2018-04-29: 2 g via INTRAVENOUS
  Filled 2018-04-29: qty 100

## 2018-04-29 MED ORDER — ROCURONIUM BROMIDE 10 MG/ML (PF) SYRINGE
PREFILLED_SYRINGE | INTRAVENOUS | Status: AC
  Filled 2018-04-29: qty 30

## 2018-04-29 MED ORDER — ROCURONIUM BROMIDE 100 MG/10ML IV SOLN
INTRAVENOUS | Status: DC | PRN
  Administered 2018-04-29 (×2): 20 mg via INTRAVENOUS

## 2018-04-29 MED ORDER — HYDROCODONE-ACETAMINOPHEN 7.5-325 MG PO TABS: 1.0000 | ORAL_TABLET | Freq: Once | ORAL | Status: DC | PRN

## 2018-04-29 MED ORDER — 0.9 % SODIUM CHLORIDE (POUR BTL) OPTIME
TOPICAL | Status: DC | PRN
  Administered 2018-04-29: 1000 mL

## 2018-04-29 SURGICAL SUPPLY — 34 items
APPLIER CLIP 9.375 SM OPEN (CLIP) ×2
CHLORAPREP W/TINT 26ML (MISCELLANEOUS) ×2 IMPLANT
CLIP APPLIE 9.375 SM OPEN (CLIP) ×1 IMPLANT
CLOTH BEACON ORANGE TIMEOUT ST (SAFETY) ×2 IMPLANT
CONT SPECI 4OZ STER CLIK (MISCELLANEOUS) ×2 IMPLANT
COVER LIGHT HANDLE STERIS (MISCELLANEOUS) ×4 IMPLANT
COVER PROBE W GEL 5X96 (DRAPES) ×2 IMPLANT
DECANTER SPIKE VIAL GLASS SM (MISCELLANEOUS) ×2 IMPLANT
DERMABOND ADVANCED (GAUZE/BANDAGES/DRESSINGS) ×1
DERMABOND ADVANCED .7 DNX12 (GAUZE/BANDAGES/DRESSINGS) ×1 IMPLANT
ELECT REM PT RETURN 9FT ADLT (ELECTROSURGICAL) ×2
ELECTRODE REM PT RTRN 9FT ADLT (ELECTROSURGICAL) ×1 IMPLANT
GLOVE BIO SURGEON STRL SZ 6.5 (GLOVE) ×2 IMPLANT
GLOVE BIOGEL M 7.0 STRL (GLOVE) ×2 IMPLANT
GLOVE BIOGEL PI IND STRL 6.5 (GLOVE) ×1 IMPLANT
GLOVE BIOGEL PI IND STRL 7.0 (GLOVE) ×3 IMPLANT
GLOVE BIOGEL PI INDICATOR 6.5 (GLOVE) ×1
GLOVE BIOGEL PI INDICATOR 7.0 (GLOVE) ×3
GOWN STRL REUS W/TWL LRG LVL3 (GOWN DISPOSABLE) ×8 IMPLANT
KIT TURNOVER KIT A (KITS) ×2 IMPLANT
MANIFOLD NEPTUNE II (INSTRUMENTS) ×2 IMPLANT
NEEDLE HYPO 18GX1.5 BLUNT FILL (NEEDLE) ×2 IMPLANT
NEEDLE HYPO 25X1 1.5 SAFETY (NEEDLE) ×4 IMPLANT
NS IRRIG 1000ML POUR BTL (IV SOLUTION) ×2 IMPLANT
PACK MINOR (CUSTOM PROCEDURE TRAY) ×2 IMPLANT
PAD ARMBOARD 7.5X6 YLW CONV (MISCELLANEOUS) ×2 IMPLANT
SET BASIN LINEN APH (SET/KITS/TRAYS/PACK) ×2 IMPLANT
SPONGE LAP 18X18 RF (DISPOSABLE) ×2 IMPLANT
SUT MNCRL AB 4-0 PS2 18 (SUTURE) ×2 IMPLANT
SUT SILK 2 0 SH (SUTURE) ×2 IMPLANT
SUT VIC AB 3-0 SH 27 (SUTURE) ×1
SUT VIC AB 3-0 SH 27X BRD (SUTURE) ×1 IMPLANT
SYR BULB IRRIGATION 50ML (SYRINGE) ×2 IMPLANT
SYR CONTROL 10ML LL (SYRINGE) ×4 IMPLANT

## 2018-04-29 NOTE — Anesthesia Postprocedure Evaluation (Signed)
Anesthesia Post Note  Patient: Lorraine Peters  Procedure(s) Performed: PARTIAL MASTECTOMY WITH NEEDLE LOCALIZATION AND AXILLARY SENTINEL LYMPH NODE BX (Left )  Patient location during evaluation: PACU Anesthesia Type: General Level of consciousness: awake and alert and patient cooperative Pain management: pain level controlled Vital Signs Assessment: post-procedure vital signs reviewed and stable Respiratory status: spontaneous breathing, respiratory function stable and nonlabored ventilation Cardiovascular status: blood pressure returned to baseline Postop Assessment: no apparent nausea or vomiting Anesthetic complications: no     Last Vitals:  Vitals:   04/29/18 1400 04/29/18 1415  BP:  122/74  Pulse: 87 79  Resp: 15 15  Temp: 36.4 C   SpO2: 98% 98%    Last Pain:  Vitals:   04/29/18 1400  TempSrc:   PainSc: 8                  Raechel Marcos J

## 2018-04-29 NOTE — Transfer of Care (Signed)
Immediate Anesthesia Transfer of Care Note  Patient: Lorraine Peters  Procedure(s) Performed: PARTIAL MASTECTOMY WITH NEEDLE LOCALIZATION AND AXILLARY SENTINEL LYMPH NODE BX (Left )  Patient Location: PACU  Anesthesia Type:General  Level of Consciousness: awake and patient cooperative  Airway & Oxygen Therapy: Patient Spontanous Breathing and Patient connected to nasal cannula oxygen  Post-op Assessment: Report given to RN and Post -op Vital signs reviewed and stable  Post vital signs: Reviewed and stable  Last Vitals:  Vitals Value Taken Time  BP    Temp    Pulse    Resp    SpO2      Last Pain:  Vitals:   04/29/18 1046  TempSrc: Oral  PainSc: 0-No pain      Patients Stated Pain Goal: 7 (01/05/24 3664)  Complications: No apparent anesthesia complications

## 2018-04-29 NOTE — Anesthesia Procedure Notes (Addendum)
Procedure Name: Intubation Date/Time: 04/29/2018 12:05 PM Performed by: Vista Deck, CRNA Pre-anesthesia Checklist: Patient identified, Patient being monitored, Timeout performed, Emergency Drugs available and Suction available Patient Re-evaluated:Patient Re-evaluated prior to induction Oxygen Delivery Method: Circle System Utilized Preoxygenation: Pre-oxygenation with 100% oxygen Induction Type: IV induction, Rapid sequence and Cricoid Pressure applied Laryngoscope Size: Miller and 2 Grade View: Grade II Tube type: Oral Tube size: 7.0 mm Number of attempts: 1 Airway Equipment and Method: stylet and Oral airway Placement Confirmation: ETT inserted through vocal cords under direct vision,  positive ETCO2 and breath sounds checked- equal and bilateral Secured at: 22 cm Tube secured with: Tape Dental Injury: Teeth and Oropharynx as per pre-operative assessment

## 2018-04-29 NOTE — Anesthesia Preprocedure Evaluation (Signed)
Anesthesia Evaluation  Patient identified by MRN, date of birth, ID band Patient awake    Reviewed: Allergy & Precautions, NPO status , Patient's Chart, lab work & pertinent test results  History of Anesthesia Complications (+) PONV  Airway Mallampati: II  TM Distance: >3 FB Neck ROM: Full    Dental no notable dental hx. (+) Teeth Intact   Pulmonary neg pulmonary ROS, former smoker,    Pulmonary exam normal breath sounds clear to auscultation       Cardiovascular Exercise Tolerance: Good negative cardio ROS Normal cardiovascular examI Rhythm:Regular Rate:Normal     Neuro/Psych  Headaches, negative psych ROS   GI/Hepatic Neg liver ROS, GERD  Medicated and Controlled,  Endo/Other  negative endocrine ROS  Renal/GU negative Renal ROSH/o stones in past -no active stones currently   negative genitourinary   Musculoskeletal negative musculoskeletal ROS (+)   Abdominal   Peds negative pediatric ROS (+)  Hematology negative hematology ROS (+)   Anesthesia Other Findings   Reproductive/Obstetrics negative OB ROS                             Anesthesia Physical Anesthesia Plan  ASA: II  Anesthesia Plan: General   Post-op Pain Management:    Induction: Intravenous  PONV Risk Score and Plan:   Airway Management Planned: Oral ETT  Additional Equipment:   Intra-op Plan:   Post-operative Plan: Extubation in OR  Informed Consent: I have reviewed the patients History and Physical, chart, labs and discussed the procedure including the risks, benefits and alternatives for the proposed anesthesia with the patient or authorized representative who has indicated his/her understanding and acceptance.     Dental advisory given  Plan Discussed with: CRNA  Anesthesia Plan Comments:         Anesthesia Quick Evaluation

## 2018-04-29 NOTE — Interval H&P Note (Signed)
History and Physical Interval Note:  04/29/2018 11:48 AM  Lorraine Peters  has presented today for surgery, with the diagnosis of DCIS of left breast  The various methods of treatment have been discussed with the patient and family. After consideration of risks, benefits and other options for treatment, the patient has consented to  Procedure(s): PARTIAL MASTECTOMY WITH NEEDLE LOCALIZATION AND AXILLARY SENTINEL LYMPH NODE BX (Left) as a surgical intervention .  The patient's history has been reviewed, patient examined, no change in status, stable for surgery.  I have reviewed the patient's chart and labs.  Questions were answered to the patient's satisfaction.    Marked left side. No additional questions.   Virl Cagey

## 2018-04-29 NOTE — Op Note (Signed)
[facility] Operative Note  04/29/18  Preoperative Diagnosis: Left ductal carcinoma in situ, high grade with comedonecrosis    Postoperative Diagnosis: Same   Procedure(s) Performed:  Left breast partial mastectomy after needle location and sentinel node biopsy    Surgeon: Lanell Matar. Constance Haw, MD   Assistants: No qualified resident was available    Anesthesia: General endotracheal   Anesthesiologist: Dr. Hilaria Ota    Specimens:  Sentinel Node; Left Breast Mass; Superior margin; Inferior margin; medial margin; lateral margin; deep margin    Estimated Blood Loss: Minimal   Blood Replacement: None    Complications: None   Wound Class: Clean    Operative Indications:  Lorraine Peters is a 48 yo with a DCIS of the left breast with comedonecrosis centrally and high grade features. She has a family history of breast cancer in her mother, and her mother actually had recurrent breast cancer per report.  The patient's left breast DCIS is also located in the upper outer quadrant of the breast, and we discussed that standard for DCIS we only do partial mastectomy/ lumpectomy with radiation to the breast, but given her high grade features and the location, I would recommend sentinel node sampling in order to not disrupt the lymph drainage of the area pending any invasive cancer being present in the final pathology.   We discussed the risk and benefits of the procedure including but not limited to bleeding, infection, seroma formation, need for further surgery, cosmetic changes to the breast, and need for radiation to the breast.  She understood this thinking, and wanted to proceed with partial mastectomy after needle location with sentinel node biopsy. Of note, I discussed with Dr. Thornton Papas, radiology, her needle localization prior to the procedure given that her marker migrated.  He will localize the pathology/ calcifications of concern, and the biopsy marker which migrated is not being  removed.   Findings: Sentinel nodes, hot and blue; fibrosis tissue of the breast inferiorly possibly from prior biopsy    Procedure: The patient underwent needle localization and nuclear tracer placement prior to comign to the operating room.  She was then taken to the operating room and placed supine. General endotracheal anesthesia was induced. Intravenous antibiotics were administered per protocol.  A JACHO approved time out was performed, and the blue dye was injected subdermally around the areola. This was massaged for several minutes.  The needle localization wire was trimmed down.  The left breast and axilla was prepared and draped in the usual sterile fashion.   A repeat JACHO approved time out was completed.  A handheld gamma probe was used to identify the location of the hottest spot in the axilla. The counts were 427.  An incision was made and carried down through the tissue with electrocautery. A blue ho nodule was identified.  The probe was placed and counts were 814.  The node was excised in its entirety. Ex vivo, the counts were 7342.  The probe was placed back into the bed, and additional counts of 840 were measured.  A second blue and hot node was identified and removed in its entirety.  The node Ex vivo, measured 53716.  I am unsure if this was actually the first node in the system, and was just not removed initially.  The remaining counts in the residual bed were all under 266, which is < 10 % of the 53716 or the 7342.   No additional palpable nodes or blue nodes or hot spots were identified.  The wound was packed and attention was turned to the partial mastectomy.   A circumareolar incision was made and the wire was delivered into the wound. An Alis clamp was used to retract the tissue and the wire. Flaps were created. Dissection was taken down circumferentially, taking care to include the entire localizing needle and a wide margin of grossly normal tissue.  The specimen and entire  localizing wire were removed and sent to radiology after being oriented (short superior, long lateral).  Confirmation was received that the target lesion was in the specimen. Again the marker was not in the specimen and was not expected to be in the specimen as it had migrated.  Additional margins of the cavity were taken including deep, inferior, superior, medial, and lateral margins.   The wounds were irrigated.  The axilla was closed with deep 3-0 Vicryl to close the dead space and a 4-0 subcuticular Monocryl and dermabond. The partial mastectomy site was closed with 3-0 Vicryl but the dead space was not closed to allow for seroma formation.  The skin was closed with a 4-0 Monocryl subcuticular and dermabond.  There was some minor skin abrasion possibly from retraction that was noted on the inferior portion of the breast incision.    Final inspection revealed acceptable hemostasis. All counts were correct at the end of the case. The patient was awakened from anesthesia and extubate without complication.  The patient went to the PACU in stable condition.   Lorraine Labrum, MD [facility] [address] [place], [place] [address] [phone] (office)

## 2018-04-29 NOTE — Discharge Instructions (Signed)
Discharge Instructions:  Common Complaints: Pain at the incision sites. Swelling and bruising.   Diet/ Activity: Diet as tolerated. You may not have an appetite, but it is important to stay hydrated. Drink 64 ounces of water a day. Your appetite will return with time.  Shower per your regular routine daily.  Do not take hot showers. Take warm showers that are less than 10 minutes. Rest and listen to your body, but do not remain in bed all day.  Walk everyday for at least 15-20 minutes. Deep cough and move around every 1-2 hours in the first few days after surgery.  Do not lift > 10 lbs or stretch excessively with the left arm.  Do not pick at the dermabond glue on your incision sites.  This glue film will remain in place for 1-2 weeks and will start to peel off.  Do not place lotions or balms on your incision unless instructed to specifically by Dr. Constance Haw.   Medication: Take tylenol and ibuprofen as needed for pain control, alternating every 4-6 hours.  Example:  Tylenol 1000mg  @ 6am, 12noon, 6pm, 66midnight (Do not exceed 4000mg  of tylenol a day). Ibuprofen 800mg  @ 9am, 3pm, 9pm, 3am (Do not exceed 3600mg  of ibuprofen a day).  Take Roxicodone for breakthrough pain every 4 hours.  Take Colace for constipation related to narcotic pain medication. If you do not have a bowel movement in 2 days, take Miralax over the counter.  Drink plenty of water to also prevent constipation.   Contact Information: If you have questions or concerns, please call our office, [phone], Monday- Thursday 8AM-5PM and Friday 8AM-12Noon.  If it is after hours or on the weekend, please call [facility], [phone], and ask to speak to the surgeon on call for Dr. Constance Haw at [facility].     Lumpectomy, Care After This sheet gives you information about how to care for yourself after your procedure. Your health care provider may also give you more specific instructions. If you have problems or  questions, contact your health care provider. What can I expect after the procedure? After the procedure, it is common to have:  Breast swelling.  Breast tenderness.  Stiffness in your arm or shoulder.  A change in the shape and feel of your breast.  Scar tissue that feels hard to the touch in the area where the lump was removed. Follow these instructions at home: Medicines  Take over-the-counter and prescription medicines only as told by your health care provider.  Take your antibiotic medicine as told by your health care provider. Do not stop taking the antibiotic even if you start to feel better.  If you are taking prescription pain medicine, take actions to prevent or treat constipation. Your health care provider may recommend that you: ? Drink enough fluid to keep your urine pale yellow. ? Eat foods that are high in fiber, such as fresh fruits and vegetables, whole grains, and beans. ? Limit foods that are high in fat and processed sugars, such as fried and sweet foods. ? Take an over-the-counter or prescription medicine for constipation. Bathing  Do not take baths, swim, or use a hot tub until your health care provider approves. You may shower.  Incision care      Follow instructions from your health care provider about how to take care of your incision. Make sure you: ? Wash your hands with soap and water before you change your bandage (dressing). If soap and water are not available,  use hand sanitizer. ? Change your dressing as told by your health care provider. ? Leave stitches (sutures), skin glue, or adhesive strips in place. These skin closures may need to stay in place for 2 weeks or longer. If adhesive strip edges start to loosen and curl up, you may trim the loose edges. Do not remove adhesive strips completely unless your health care provider tells you to do that.  Check your incision area every day for signs of infection. Check for: ? Redness, swelling, or  pain. ? Fluid or blood. ? Warmth. ? Pus or a bad smell.  Keep your dressing clean and dry.  If you were sent home with a surgical drain in place, follow instructions from your health care provider about emptying it. Activity  Return to your normal activities as told by your health care provider. Ask your health care provider what activities are safe for you.  Be careful to avoid any activities that could cause an injury to your arm on the side of your surgery.  Do not lift anything that is heavier than 10 lb (4.5 kg), or the limit that you are told, until your health care provider says that it is safe. Avoid lifting with the arm that is on the side of your surgery.  Do not carry heavy objects on your shoulder on the side of your surgery.  After your drain is removed, you should perform exercises to keep your arm from getting stiff and swollen. Talk with your health care provider about which exercises are safe for you. General instructions  Do not drive or use heavy machinery while taking prescription pain medicine.  Wear a supportive bra as told by your health care provider.  Raise (elevate) your arm above the level of your heart while you are sitting or lying down.  Do not wear tight jewelry on your arm, wrist, or fingers on the side of your surgery.  Keep all follow-up visits as told by your health care provider. This is important. ? You may need to be screened for extra fluid around the lymph nodes (lymphedema). Follow instructions from your health care provider about how often you should be checked.  If you had any lymph nodes removed during your procedure, be sure to tell all of your health care providers. This is important information to share before you are involved in certain procedures, such as having blood tests or having your blood pressure taken. Contact a health care provider if:  You develop a rash.  You have a fever.  Your pain medicine is not working.  Your  swelling, weakness, or numbness in your arm has not improved after a few weeks.  You have new swelling in your breast or arm.  You have redness, swelling, or pain in your incision area.  You have fluid or blood coming from your incision.  Your incision feels warm to the touch.  You have pus or a bad smell coming from your incision. Get help right away if:  You have very bad pain in your breast or arm.  You have chest pain.  You have difficulty breathing. Summary  After the procedure, it is common to have breast tenderness, swelling, and stiffness in your arm and shoulder.  Follow instructions from your health care provider about how to take care of your incision.  Do not lift anything that is heavier than 10 lb (4.5 kg), or the limit that you are told, until your health care provider says that it  is safe. Avoid lifting with the arm that is on the side of your surgery.  If you had any lymph nodes removed during your procedure, be sure to tell all of your health care providers. This is important information to share before you are involved in certain procedures, such as having blood tests or having your blood pressure taken. This information is not intended to replace advice given to you by your health care provider. Make sure you discuss any questions you have with your health care provider. Document Released: 03/13/2006 Document Revised: 06/24/2017 Document Reviewed: 11/08/2015 Elsevier Interactive Patient Education  2019 [employer].   Sentinel Lymph Node Biopsy in Breast Cancer Treatment, Care After This sheet gives you information about how to care for yourself after your procedure. Your health care provider may also give you more specific instructions. If you have problems or questions, contact your health care provider. What can I expect after the procedure? After the procedure, it is common to have:  Blue-colored urine for the next 24 hours. This is normal. It is caused  by the dye that was used during the procedure.  Blue skin at the injection site for up to 8 weeks.  Numbness, tingling, or pain near your incision.  Swelling or bruising near your incision. Follow these instructions at home: Activity  Avoid activities that take a lot of effort (are strenuous).  Return to your normal activities as told by your health care provider. Ask your health care provider what activities are safe for you. Incision care   Follow instructions from your health care provider about how to take care of your incision. Make sure you: ? Wash your hands with soap and water before you change your bandage (dressing). If soap and water are not available, use hand sanitizer. ? Change your dressing as told by your health care provider. ? Leave stitches (sutures), skin glue, or adhesive strips in place. These skin closures may need to stay in place for 2 weeks or longer. If adhesive strip edges start to loosen and curl up, you may trim the loose edges. Do not remove adhesive strips completely unless your health care provider tells you to do that.  Do not take baths, swim, or use a hot tub until your health care provider approves.You may shower.  Check your biopsy site every day for signs of infection. Check for: ? More redness, swelling, or pain. ? More fluid or blood. ? Warmth. ? Pus or a bad smell. General instructions  If you were given a surgical bra, wear it for the next 48 hours. You may remove the bra to shower.  Take over-the-counter and prescription medicines only as told by your health care provider.  You may resume your regular diet.  Do not have your blood pressure taken or have blood drawn from the arm on the side of the biopsy until your health care provider says it is okay.  You may need to be screened for extra fluid around the lymph nodes (lymphedema). Follow instructions from your health care provider about how often you should be checked.  Keep all  follow-up visits as told by your health care provider. This is important. Contact a health care provider if:  You have more redness, swelling, or pain around your biopsy site.  You have more fluid or blood coming from your incision.  Your incision feels warm to the touch.  You have pus or a bad smell coming from your incision.  You have nausea and  vomiting.  You have any new bruising.  You have chills or fever. Get help right away if:  You have pain that is getting worse, and your medicine is not helping.  You have vomiting that will not stop.  You have chest pain or trouble breathing. Summary  Follow instructions from your health care provider about how to take care of your incision.  Do not have your blood pressure taken or have blood drawn from the arm on the side of the biopsy until your health care provider says it is okay.  You may need to be screened for extra fluid around the lymph nodes (lymphedema). Follow instructions from your health care provider about how often you should be checked. This information is not intended to replace advice given to you by your health care provider. Make sure you discuss any questions you have with your health care provider. Document Released: 03/02/2013 Document Revised: 11/15/2015 Document Reviewed: 11/15/2015 Elsevier Interactive Patient Education  2019 [place] Anesthesia, Adult, Care After This sheet gives you information about how to care for yourself after your procedure. Your health care provider may also give you more specific instructions. If you have problems or questions, contact your health care provider. What can I expect after the procedure? After the procedure, the following side effects are common:  Pain or discomfort at the IV site.  Nausea.  Vomiting.  Sore throat.  Trouble concentrating.  Feeling cold or chills.  Weak or tired.  Sleepiness and fatigue.  Soreness and body aches.  These side effects can affect parts of the body that were not involved in surgery. Follow these instructions at home:  For at least 24 hours after the procedure:  Have a responsible adult stay with you. It is important to have someone help care for you until you are awake and alert.  Rest as needed.  Do not: ? Participate in activities in which you could fall or become injured. ? Drive. ? Use heavy machinery. ? Drink alcohol. ? Take sleeping pills or medicines that cause drowsiness. ? Make important decisions or sign legal documents. ? Take care of children on your own. Eating and drinking  Follow any instructions from your health care provider about eating or drinking restrictions.  When you feel hungry, start by eating small amounts of foods that are soft and easy to digest (bland), such as toast. Gradually return to your regular diet.  Drink enough fluid to keep your urine pale yellow.  If you vomit, rehydrate by drinking water, juice, or clear broth. General instructions  If you have sleep apnea, surgery and certain medicines can increase your risk for breathing problems. Follow instructions from your health care provider about wearing your sleep device: ? Anytime you are sleeping, including during daytime naps. ? While taking prescription pain medicines, sleeping medicines, or medicines that make you drowsy.  Return to your normal activities as told by your health care provider. Ask your health care provider what activities are safe for you.  Take over-the-counter and prescription medicines only as told by your health care provider.  If you smoke, do not smoke without supervision.  Keep all follow-up visits as told by your health care provider. This is important. Contact a health care provider if:  You have nausea or vomiting that does not get better with medicine.  You cannot eat or drink without vomiting.  You have pain that does not get better with  medicine.  You are  unable to pass urine.  You develop a skin rash.  You have a fever.  You have redness around your IV site that gets worse. Get help right away if:  You have difficulty breathing.  You have chest pain.  You have blood in your urine or stool, or you vomit blood. Summary  After the procedure, it is common to have a sore throat or nausea. It is also common to feel tired.  Have a responsible adult stay with you for the first 24 hours after general anesthesia. It is important to have someone help care for you until you are awake and alert.  When you feel hungry, start by eating small amounts of foods that are soft and easy to digest (bland), such as toast. Gradually return to your regular diet.  Drink enough fluid to keep your urine pale yellow.  Return to your normal activities as told by your health care provider. Ask your health care provider what activities are safe for you. This information is not intended to replace advice given to you by your health care provider. Make sure you discuss any questions you have with your health care provider. Document Released: 06/03/2000 Document Revised: 10/11/2016 Document Reviewed: 10/11/2016 Elsevier Interactive Patient Education  2019 [employer].

## 2018-05-01 ENCOUNTER — Encounter (HOSPITAL_COMMUNITY): Payer: Self-pay | Admitting: General Surgery

## 2018-05-05 ENCOUNTER — Telehealth: Payer: Self-pay | Admitting: General Surgery

## 2018-05-05 MED FILL — ATORVASTATIN 20 MG TABLET: 20 | 60 days supply | Qty: 60 | Fill #0 | Status: TO

## 2018-05-05 MED FILL — OMEPRAZOLE 40 MG CPDR: 40 | 90 days supply | Qty: 90 | Fill #0 | Status: TO

## 2018-05-05 NOTE — Telephone Encounter (Signed)
[facility]  Diagnosis 1. Lymph node, sentinel, biopsy, left axillary - LYMPH NODE, NEGATIVE FOR CARCINOMA (0/1). 2. Breast, lumpectomy, left - DUCTAL CARCINOMA IN SITU, INTERMEDIATE TO HIGH NUCLEAR GRADE, FOCALLY INVOLVING A COMPLEX SCLEROSING LESION. SEE NOTE. - NO EVIDENCE OF INVASIVE CARCINOMA. - ALL RESECTION MARGINS ARE NEGATIVE FOR DCIS (INCLUDES EVALUATION OF SEPARATELY SUBMITTED MARGINS). - BIOPSY SITE CHANGES. - BACKGROUND BREAST PARENCHYMA WITH FIBROCYSTIC CHANGE AND CALCIFICATIONS. - SEE ONCOLOGY TABLE 3. Breast, excision, left deep - BENIGN BREAST PARENCHYMA, NEGATIVE FOR IN SITU OR INVASIVE CARCINOMA. 4. Breast, excision, left inferior - BENIGN BREAST PARENCHYMA, NEGATIVE FOR IN SITU OR INVASIVE CARCINOMA. 5. Breast, excision, left lateral - BENIGN FIBROADIPOSE TISSUE, NEGATIVE FOR CARCINOMA. 6. Breast, excision, left superior - BENIGN FIBROADIPOSE TISSUE, NEGATIVE FOR CARCINOMA. 7. Breast, excision, left medial - BENIGN FIBROADIPOSE TISSUE, NEGATIVE FOR CARCINOMA. 8. Lymph node, sentinel, biopsy, left axillary - LYMPH NODE, NEGATIVE FOR CARCINOMA (0/1). 9. Lymph node, sentinel, biopsy, left axillary - LYMPH NODE, NEGATIVE FOR CARCINOMA (0/1).  Will still refer to Oncology to discuss her options and refer to Radiation Oncology.  Curlene Labrum, MD [facility] [address] [place], [place] [address] [phone] (office)

## 2018-05-11 ENCOUNTER — Encounter (HOSPITAL_COMMUNITY): Payer: Self-pay | Admitting: *Deleted

## 2018-05-11 NOTE — Progress Notes (Signed)
Oncology Navigator Note:  Patient was referred to our office by Dr. Blake Divine.  I called patient today to introduce myself and provide information in how I will be involved with her care.  I provided information on their first visit and what to expect.  I made sure patient was aware of appointment time and directions to the cancer center.  My phone number was given so that she can call me with any questions or concerns.  She voices appreciation and understanding.

## 2018-05-12 ENCOUNTER — Encounter (HOSPITAL_COMMUNITY): Payer: Self-pay | Admitting: Hematology

## 2018-05-12 ENCOUNTER — Other Ambulatory Visit: Payer: Self-pay

## 2018-05-12 ENCOUNTER — Inpatient Hospital Stay (HOSPITAL_COMMUNITY): Payer: 59 | Attending: Hematology | Admitting: Hematology

## 2018-05-12 DIAGNOSIS — Z79899 Other long term (current) drug therapy: Secondary | ICD-10-CM | POA: Diagnosis not present

## 2018-05-12 DIAGNOSIS — Z170 Estrogen receptor positive status [ER+]: Secondary | ICD-10-CM | POA: Insufficient documentation

## 2018-05-12 DIAGNOSIS — D0512 Intraductal carcinoma in situ of left breast: Secondary | ICD-10-CM | POA: Diagnosis not present

## 2018-05-12 MED ORDER — TAMOXIFEN CITRATE 20 MG PO TABS: 20.0000 mg | ORAL_TABLET | Freq: Every day | ORAL | 6 refills | Status: DC

## 2018-05-12 MED FILL — TAMOXIFEN CITRATE 20 MG TAB: 20 | 30 days supply | Qty: 30 | Fill #0 | Status: TO

## 2018-05-12 NOTE — Progress Notes (Signed)
Faxed referral and patient records to [facility], [place].

## 2018-05-12 NOTE — Progress Notes (Signed)
[facility] CONSULT NOTE  Patient Care Team: Celene Squibb, MD as PCP - General (Internal Medicine) Danie Binder, MD as Consulting Physician (Gastroenterology)  CHIEF COMPLAINTS/PURPOSE OF CONSULTATION: Newly diagnosed left breast DCIS.  HISTORY OF PRESENTING ILLNESS:  Lorraine Peters 48 y.o. female is here because of recent diagnosis of left DCIS. She started having mammograms at the age of 48 due to a family history of breast cancer.  Mass was found on her routine mammogram on 03/02/2018. She had her first biopsy on 04/13/2018. She underwent her lumpectomy on 04/29/2018. She reports she is recovering well since her surgery. She has sharp nerve like pains under her arm and at her lumpectomy site. She also feels like her breast is heavy and tender. Denies any redness, bleeding, or drainage. Denies any nausea, vomiting, or diarrhea. Denies any new pains. Had not noticed any recent bleeding such as epistaxis, hematuria or hematochezia. Denies recent chest pain on exertion, shortness of breath on minimal exertion, pre-syncopal episodes, or palpitations. Denies any numbness or tingling in hands or feet. Denies any recent fevers, infections, or recent hospitalizations. Patient reports appetite at 100% and energy level at 75%. She is eating well and maintaining her weight.  She lives at home with her boyfriend and is full functioning. She performs all her own ADLs and activities. She is able to drive, cook, clean, and handle all her own finances. 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  She has a positive family history of breast cancer. Her mother had breast cancer and lung cancer. Her maternal aunt had breast cancer. Her sister had anal cancer. Her maternal uncle had colon cancer.   I reviewed her records extensively and collaborated the history with the patient.  SUMMARY OF ONCOLOGIC HISTORY: No previous history of cancer.   In terms of breast cancer risk profile:  She  menarched at early age of 10 and she is pre-menopausal  She had 1 pregnancy, her first child was born at age 48 She did received birth control pills for approximately 10 years.   She was never exposed to fertility medications or hormone replacement therapy.  She has a positive family history of Breast/GYN/GI cancer  MEDICAL HISTORY:  Past Medical History:  Diagnosis Date  . Breast cancer ([place])   . History of kidney stones   . Hypercholesterolemia   . Migraine   . Ovarian cyst   . PONV (postoperative nausea and vomiting)   . Reflux     SURGICAL HISTORY: Past Surgical History:  Procedure Laterality Date  . ESOPHAGOGASTRODUODENOSCOPY N/A 09/12/2017   Procedure: ESOPHAGOGASTRODUODENOSCOPY (EGD);  Surgeon: Daneil Dolin, MD;  Location: [facility] SUITE;  Service: Endoscopy;  Laterality: N/A;  2:30pm  . PARTIAL MASTECTOMY WITH NEEDLE LOCALIZATION AND AXILLARY SENTINEL LYMPH NODE BX Left 04/29/2018   Procedure: PARTIAL MASTECTOMY WITH NEEDLE LOCALIZATION AND AXILLARY SENTINEL LYMPH NODE BX;  Surgeon: Virl Cagey, MD;  Location: [facility] ORS;  Service: General;  Laterality: Left;  . TUBAL LIGATION      SOCIAL HISTORY: Social History   Socioeconomic History  . Marital status: Divorced    Spouse name: Not on file  . Number of children: 1  . Years of education: Not on file  . Highest education level: Not on file  Occupational History  . Not on file  Social Needs  . Financial resource strain: Not hard at all  . Food insecurity:    Worry: Never true  Inability: Never true  . Transportation needs:    Medical: No    Non-medical: No  Tobacco Use  . Smoking status: Former Smoker    Packs/day: 0.50    Years: 28.00    Pack years: 14.00    Types: Cigarettes    Last attempt to quit: 04/22/2018    Years since quitting: 0.0  . Smokeless tobacco: Never Used  Substance and Sexual Activity  . Alcohol use: No    Alcohol/week: 0.0 standard drinks  . Drug use: No  . Sexual activity: Yes     Birth control/protection: Surgical  Lifestyle  . Physical activity:    Days per week: 3 days    Minutes per session: 20 min  . Stress: Not at all  Relationships  . Social connections:    Talks on phone: More than three times a week    Gets together: Three times a week    Attends religious service: More than 4 times per year    Active member of club or organization: No    Attends meetings of clubs or organizations: Never    Relationship status: Divorced  . Intimate partner violence:    Fear of current or ex partner: No    Emotionally abused: No    Physically abused: No    Forced sexual activity: No  Other Topics Concern  . Not on file  Social History Narrative  . Not on file    FAMILY HISTORY: Family History  Problem Relation Age of Onset  . Arthritis Mother   . Cancer Mother        breast  . Breast cancer Mother 37  . COPD Father   . Heart disease Father   . Hyperlipidemia Father   . Hypertension Father   . Cancer Sister 61       anal, deceased due to mva but had advanced disease  . Stroke Maternal Grandfather   . Cancer Maternal Aunt        breast  . Breast cancer Maternal Aunt   . Cancer Maternal Uncle        breast  . Breast cancer Maternal Uncle     ALLERGIES:  is allergic to bee venom.  MEDICATIONS:  Current Outpatient Medications  Medication Sig Dispense Refill  . acetaminophen (TYLENOL) 500 MG tablet Take 1,000 mg by mouth every 6 (six) hours as needed for moderate pain or headache.     . albuterol (PROVENTIL HFA;VENTOLIN HFA) 108 (90 Base) MCG/ACT inhaler Inhale 2 puffs into the lungs every 6 (six) hours as needed for wheezing or shortness of breath. 1 Inhaler 0  . atorvastatin (LIPITOR) 20 MG tablet TAKE 1 Tablet BY MOUTH ONCE DAILY (Patient taking differently: Take 20 mg by mouth at bedtime. ) 90 tablet 2  . MAXALT 10 MG tablet TAKE 1 TABLET BY MOUTH AT ONSET OF HEADACHE, MAY REPEAT IN 2 HOURS. MAX 2 TABLETS IN 24 HOURS. (Patient taking  differently: Take 10 mg by mouth as needed for migraine. ) 15 tablet 0  . omeprazole (PRILOSEC) 40 MG capsule Take 40 mg by mouth daily.    . benzonatate (TESSALON) 100 MG capsule Take 1 capsule (100 mg total) by mouth every 8 (eight) hours. (Patient not taking: Reported on 04/21/2018) 21 capsule 0  . dicyclomine (BENTYL) 20 MG tablet Take 1 tablet (20 mg total) by mouth 2 (two) times daily as needed for spasms. (Patient not taking: Reported on 04/21/2018) 20 tablet 0  . docusate sodium (COLACE)  100 MG capsule Take 1 capsule (100 mg total) by mouth 2 (two) times daily. (Patient not taking: Reported on 05/12/2018) 60 capsule 2  . oxyCODONE (ROXICODONE) 5 MG immediate release tablet Take 1 tablet (5 mg total) by mouth every 4 (four) hours as needed for severe pain. (Patient not taking: Reported on 05/12/2018) 30 tablet 0  . sucralfate (CARAFATE) 1 g tablet Take 1 tablet (1 g total) by mouth 4 (four) times daily -  with meals and at bedtime. (Patient not taking: Reported on 04/21/2018) 40 tablet 0  . tamoxifen (NOLVADEX) 20 MG tablet Take 1 tablet (20 mg total) by mouth daily. 30 tablet 6   No current facility-administered medications for this visit.     REVIEW OF SYSTEMS:   Constitutional: Denies fevers, chills or abnormal night sweats Eyes: Denies blurriness of vision, double vision or watery eyes Ears, nose, mouth, throat, and face: Denies mucositis or sore throat Respiratory: Denies cough, dyspnea or wheezes Cardiovascular: Denies palpitation, chest discomfort or lower extremity swelling Gastrointestinal:  Denies nausea, heartburn or change in bowel habits Skin: Denies abnormal skin rashes Lymphatics: Denies new lymphadenopathy or easy bruising Neurological:Denies numbness, tingling or new weaknesses Behavioral/Psych: Mood is stable, no new changes  Breast: Right:Denies any palpable lumps or discharge              LEFT: lumpectomy site healing, no redness or swelling All other systems were  reviewed with the patient and are negative.  PHYSICAL EXAMINATION: ECOG PERFORMANCE STATUS: 1 - Symptomatic but completely ambulatory  Vitals:   05/12/18 1408  BP: 130/77  Pulse: 95  Resp: 18  Temp: 98.5 F (36.9 C)  SpO2: 98%   Filed Weights   05/12/18 1408  Weight: 220 lb 8 oz (100 kg)    GENERAL:alert, no distress and comfortable SKIN: skin color, texture, turgor are normal, no rashes or significant lesions EYES: normal, conjunctiva are pink and non-injected, sclera clear OROPHARYNX:no exudate, no erythema and lips, buccal mucosa, and tongue normal  NECK: supple, thyroid normal size, non-tender, without nodularity LYMPH:  no palpable lymphadenopathy in the cervical, axillary or inguinal LUNGS: clear to auscultation and percussion with normal breathing effort HEART: regular rate & rhythm and no murmurs and no lower extremity edema ABDOMEN:abdomen soft, non-tender and normal bowel sounds Musculoskeletal:no cyanosis of digits and no clubbing  PSYCH: alert & oriented x 3 with fluent speech NEURO: no focal motor/sensory deficits BREAST: RIGHT:No palpable nodules in breast. No palpable axillary or supraclavicular lymphadenopathy (exam performed in the presence of a chaperone)                   Left: lumpectomy site healing, no redness or swelling  LABORATORY DATA:  I have reviewed the data as listed Lab Results  Component Value Date   WBC 8.6 03/15/2017   HGB 12.4 03/15/2017   HCT 39.4 03/15/2017   MCV 82.3 03/15/2017   PLT 288 03/15/2017   Lab Results  Component Value Date   NA 140 10/08/2017   K 4.6 10/08/2017   CL 106 10/08/2017   CO2 27 10/08/2017    RADIOGRAPHIC STUDIES: I have personally reviewed the radiological reports and agreed with the findings in the report. I have reviewed Lorraine Finders, NP's note and agree with the documentation.  I personally performed a face-to-face visit, made revisions and my assessment and plan is as follows.  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  Left breast  high-grade DCIS: - Screening mammogram on 03/31/2018 shows 7-8 mm group of calcifications in the upper outer left breast. - Biopsy on 04/13/2018 shows DCIS, high nuclear grade, ER 70%, PR 80% -She underwent lumpectomy on 04/29/2018.  This showed high-grade DCIS, 0.5 cm, margins negative.  0/1 lymph node positive.  pTis, PN0. - We discussed the results of the pathology in detail.  She is premenopausal.  I have recommended antiestrogen therapy with tamoxifen for 5 years.  We discussed the side effects in detail including but not limited to rare chance of thrombosis and endometrial hyperplasia/cancer. -I have also recommended radiation therapy to the left breast to decrease local chances of recurrence.  We will make a referral to [facility] in [place]. -She will have follow-up visits every 6 months.  She will continue yearly mammograms. -She will also continue yearly Pap smears.  Last Pap smear was reportedly negative.  2.  Family history: - Mother had breast and lung cancers.  Maternal aunt had breast cancer.  Maternal uncle had colon cancer.  Sister had anal cancer. -I will make a referral to our geneticist.   All questions were answered. The patient knows to call the clinic with any problems, questions or concerns.    Derek Jack, MD 05/12/18

## 2018-05-12 NOTE — Patient Instructions (Signed)
[facility] Discharge Instructions     Thank you for choosing [facility] to provide your oncology and hematology care.  To afford each patient quality time with our provider, please arrive at least 15 minutes before your scheduled appointment time.   If you have a lab appointment with the Cancer Center please come in thru the  Main Entrance and check in at the main information desk  You need to re-schedule your appointment should you arrive 10 or more minutes late.  We strive to give you quality time with our providers, and arriving late affects you and other patients whose appointments are after yours.  Also, if you no show three or more times for appointments you may be dismissed from the clinic at the providers discretion.     Again, thank you for choosing [facility].  Our hope is that these requests will decrease the amount of time that you wait before being seen by our physicians.       _____________________________________________________________  Should you have questions after your visit to [facility], please contact our office at [phone] between the hours of 8:00 a.m. and 4:30 p.m.  Voicemails left after 4:00 p.m. will not be returned until the following business day.  For prescription refill requests, have your pharmacy contact our office and allow 72 hours.    Cancer Center Support Programs:   > Cancer Support Group  2nd Tuesday of the month 1pm-2pm, Journey Room    

## 2018-05-12 NOTE — Assessment & Plan Note (Signed)
1.  Left breast high-grade DCIS: - Screening mammogram on 03/31/2018 shows 7-8 mm group of calcifications in the upper outer left breast. - Biopsy on 04/13/2018 shows DCIS, high nuclear grade, ER 70%, PR 80% -She underwent lumpectomy on 04/29/2018.  This showed high-grade DCIS, 0.5 cm, margins negative.  0/1 lymph node positive.  pTis, PN0. - We discussed the results of the pathology in detail.  She is premenopausal.  I have recommended antiestrogen therapy with tamoxifen for 5 years.  We discussed the side effects in detail including but not limited to rare chance of thrombosis and endometrial hyperplasia/cancer. -I have also recommended radiation therapy to the left breast to decrease local chances of recurrence.  We will make a referral to [facility] in [place]. -She will have follow-up visits every 6 months.  She will continue yearly mammograms. -She will also continue yearly Pap smears.  Last Pap smear was reportedly negative.  2.  Family history: - Mother had breast and lung cancers.  Maternal aunt had breast cancer.  Maternal uncle had colon cancer.  Sister had anal cancer. -I will make a referral to our geneticist.

## 2018-05-13 ENCOUNTER — Encounter (HOSPITAL_COMMUNITY): Payer: Self-pay | Admitting: Hematology

## 2018-05-13 NOTE — Progress Notes (Signed)
Re-faxed referral and patient records to [facility].  Fax from 05/12/2018 failed.

## 2018-05-14 ENCOUNTER — Encounter: Payer: Self-pay | Admitting: General Surgery

## 2018-05-14 ENCOUNTER — Ambulatory Visit (INDEPENDENT_AMBULATORY_CARE_PROVIDER_SITE_OTHER): Payer: Self-pay | Admitting: General Surgery

## 2018-05-14 VITALS — BP 150/100 | HR 98 | Temp 98.6°F | Resp 18 | Wt 219.8 lb

## 2018-05-14 DIAGNOSIS — D0512 Intraductal carcinoma in situ of left breast: Secondary | ICD-10-CM

## 2018-05-14 NOTE — Progress Notes (Signed)
[facility] Note   HPI:  [name] presents to clinic for post-op follow-up evaluation after left breast partial mastectomy and sentinel node for high grade DCIS. She is doing well but is having some shooting pains in the left arm at times that feel like it goes into the forearm. The skin tingles over the area after this pain. It is not constant and occurred after a lot of sweeping.  Review of Systems:  No fever or chills No drainage Shooting pain into the arm on occasion  All other review of systems: otherwise negative   Vital Signs:  BP (!) 144/100 (BP Location: Right Arm, Patient Position: Sitting, Cuff Size: Large)   Pulse 98   Temp 98.6 F (37 C) (Temporal)   Resp 18   Wt 219 lb 12.8 oz (99.7 kg)   LMP 04/20/2018   BMI 34.43 kg/m    Physical Exam:  Physical Exam Vitals signs reviewed.  HENT:     Head: Normocephalic.  Cardiovascular:     Rate and Rhythm: Normal rate.  Pulmonary:     Effort: Pulmonary effort is normal.  Chest:     Comments: Left axillary incision c/d/i with peeling dermabond, no erythema or drainage, no seroma, left breast incision with peeling dermabond, looks like she bled under the dermabond, and has some skin abrasion, no erythema or drainage Neurological:     Mental Status: She is alert.     Assessment:  48 y.o. yo Female s/p left breast partial mastectomy and sentinel node for high grade DCIS. Doing fair.   Plan:  Activity as tolerated. Can return to work 05/15/2018. Follow up with Dr. Delton Coombes and Radiation as scheduled. Neosporin to the area as needed.  If the shooting pain continues, told her we can trial some gabapentin for nerve type pain. I think as she heals and stretches that this should improve on its own. She wants to hold off on gabapentin at this time.   All of the above recommendations were discussed with the patient, and all of patient's questions were answered to her expressed satisfaction.  Curlene Labrum, MD [facility] [address] [place], [place] [address] [phone] (office)

## 2018-05-14 NOTE — Patient Instructions (Addendum)
Activity as tolerated. Can return to work 05/15/2018. Follow up with Dr. Delton Coombes and Radiation as scheduled. Neosporin to the area as needed.

## 2018-05-18 ENCOUNTER — Telehealth: Payer: Self-pay | Admitting: Emergency Medicine

## 2018-05-18 NOTE — Telephone Encounter (Signed)
Patiet called and stated   She is having pain in her left hand and she is unable to move it. She stated she has contacted her pcp doctor hall to make an appointment with him but they have not called her back. After speaking with doctor bridges she asked to see the patient tomorrow. Scheduled an appointment to see the provider on 3/10, patient stated she understood and that she would be here tomorrow to see doctor bridges.

## 2018-05-19 ENCOUNTER — Encounter: Payer: Self-pay | Admitting: General Surgery

## 2018-05-19 ENCOUNTER — Ambulatory Visit (INDEPENDENT_AMBULATORY_CARE_PROVIDER_SITE_OTHER): Payer: Self-pay | Admitting: General Surgery

## 2018-05-19 VITALS — BP 172/110 | HR 98 | Temp 98.6°F | Resp 22 | Wt 222.4 lb

## 2018-05-19 DIAGNOSIS — M7989 Other specified soft tissue disorders: Secondary | ICD-10-CM

## 2018-05-19 DIAGNOSIS — M792 Neuralgia and neuritis, unspecified: Secondary | ICD-10-CM

## 2018-05-19 DIAGNOSIS — D0512 Intraductal carcinoma in situ of left breast: Secondary | ICD-10-CM

## 2018-05-19 MED ORDER — GABAPENTIN 100 MG PO CAPS: 100.0000 mg | ORAL_CAPSULE | Freq: Three times a day (TID) | ORAL | 1 refills | Status: DC

## 2018-05-19 MED FILL — GABAPENTIN 100 MG CAPSULE: 100 | 30 days supply | Qty: 90 | Fill #0 | Status: TO

## 2018-05-19 NOTE — Progress Notes (Signed)
[facility] Note   HPI:  [name] presents to clinic for follow-up evaluation after a left partial mastectomy and sentinel node for L DCIS with high grade features, comedonecrosis. She was doing fair but having some tingling pain in her arm last week. On Friday this progressed to severe pain and swelling in her hand. She has denied any trauma, any bites, and denies any redness or fevers. She says the pain improved some with icing but returned, and is severe to the point where she cannot touch the wrist due to the pain.   Review of Systems:  Severe pain in the left hand and wrist Minor swelling No pain in the left arm or axilla Able to move the left shoulder, elbow Limited movement of the left wrist and fingers due to pain  All other review of systems: otherwise negative   Vital Signs:  BP (!) 172/110 (BP Location: Right Arm, Patient Position: Sitting, Cuff Size: Normal)   Pulse 98   Temp 98.6 F (37 C) (Temporal)   Resp (!) 22   Wt 222 lb 6.4 oz (100.9 kg)   LMP 04/20/2018   BMI 34.83 kg/m    Physical Exam:  Physical Exam Vitals signs reviewed.  HENT:     Head: Normocephalic.     Nose: Nose normal.  Neck:     Musculoskeletal: Normal range of motion.  Cardiovascular:     Rate and Rhythm: Normal rate.     Pulses:          Radial pulses are 2+ on the left side.  Pulmonary:     Effort: Pulmonary effort is normal.  Chest:     Comments: Left breast incision healing, dermabond peeling, inferior area of abrased skin improving, no erythema or drainage, axillary wound with no erythema or drainage, no swelling or tenderness in the axilla Abdominal:     General: There is no distension.     Palpations: Abdomen is soft.     Tenderness: There is no abdominal tenderness.  Musculoskeletal: Normal range of motion.     Comments: Left shoulder mobility intact, nontender upper arm/ shoulder, left elbow mobility intact, forearm nontender, wrist with tenderness and  slight swelling, hand with tenderness and slight swelling, palpable radial pulse, fingers warm and good capillary refill, no signs of bites or trauma, no redness or drainage  Neurological:     Mental Status: She is alert.     Assessment:  48 y.o. yo Female with severe hand pain after a partial mastectomy with sentinel node for L DCIS with high grade features. Her pathology demonstrated on DCIS and a negative node. She has no pain or difficulty moving the upper arm on the left. She is at risk for arm pain and lymphedema although less than with an axillary dissection, but the arm and forearm are not painful and have no notable swelling. Her symptoms are confined to the hand. It is possible that this is related to surgery still but I worry that something else separate could be going on and is causing the severe pain including a deep space hand infection, some type of inflammatory arthritis, some injury that we are unaware of at this time. I spoke to Radiology and have opted to pursue a MRI To assess the arm and hand. She has good pulses and the hand is in no way threatened. If the MRI is negative, then we can determine if we need to pursue additional imaging versus doing PT and gabapentin for  pain after breast surgery related to the axillary node biopsy.   Plan:  Take the gabapentin 100 mg three times a day as prescribed. MRI of the left upper extremity from elbow down through fingers being ordered. If you have any worsening pain, fever, chills, redness, worsening swelling, go to the ED. Discussed her high BP, and need to see her PCP Dr. Nevada Crane, do not think the hand pain is related to the BP but her Bps are getting higher and put her at risk for a stroke   All of the above recommendations were discussed with the patient and patient's family, and all of patient's and family's questions were answered to their expressed satisfaction.  Curlene Labrum, MD [facility] [address] [place], [place] [address] [phone] (office)

## 2018-05-19 NOTE — Patient Instructions (Signed)
Take the gabapentin 100 mg three times a day as prescribed. MRI of the left upper extremity from elbow down through fingers being ordered. If you have any worsening pain, fever, chills, redness, worsening swelling, go to the ED.

## 2018-05-20 ENCOUNTER — Other Ambulatory Visit: Payer: Self-pay

## 2018-05-20 ENCOUNTER — Ambulatory Visit (HOSPITAL_COMMUNITY)
Admission: RE | Admit: 2018-05-20 | Discharge: 2018-05-20 | Disposition: A | Discharge status: Home / Self Care | Payer: 59 | Source: Ambulatory Visit | Attending: General Surgery | Admitting: General Surgery

## 2018-05-20 ENCOUNTER — Other Ambulatory Visit: Payer: Self-pay | Admitting: General Surgery

## 2018-05-20 DIAGNOSIS — M792 Neuralgia and neuritis, unspecified: Secondary | ICD-10-CM

## 2018-05-20 DIAGNOSIS — M7989 Other specified soft tissue disorders: Secondary | ICD-10-CM | POA: Diagnosis not present

## 2018-05-20 DIAGNOSIS — R600 Localized edema: Secondary | ICD-10-CM | POA: Diagnosis not present

## 2018-05-21 ENCOUNTER — Telehealth: Payer: Self-pay | Admitting: General Surgery

## 2018-05-21 DIAGNOSIS — G5610 Other lesions of median nerve, unspecified upper limb: Secondary | ICD-10-CM

## 2018-05-21 MED ORDER — METHYLPREDNISOLONE 4 MG PO TBPK: ORAL_TABLET | ORAL | 0 refills | Status: DC

## 2018-05-21 MED FILL — METHYLPREDNISOLONE 4 MG TAB: 4 | 6 days supply | Qty: 21 | Fill #0

## 2018-05-21 NOTE — Telephone Encounter (Signed)
[facility]  Called patient. Still with pain and swelling in the hand. Cannot make the OK sign.   MRI done.  Upper extremity unremarkable and the axilla post op. Some post op fluid. Lower portion with edema and concern for anterior interosseous nerve syndrome.   IMPRESSION: Unremarkable MR examination of the left upper extremity. No significant bony findings. The major neurovascular bundles appear grossly normal without contrast. No mass or mass effect is demonstrated.  Expected postoperative changes involving the left breast with a moderate-sized postop fluid collection. Single 11 mm axillary lymph node is noted.  IMPRESSION: 1. Moderate edema like signal abnormality in the FPL, FDP and PQ muscles suggesting anterior interosseous nerve syndrome. There is also fluid surrounding the anterior interosseous neurovascular bundle and fluid/inflammation/edema surrounding the flexor muscles and the muscular fascia. 2. No obvious compressive lesion or hematoma.  Patient's signs and symptoms do go along with AIN Syndrome. I have spoke with Dr. Amedeo Plenty, Hand surgery, and have discussed the case. He reports that some of these neuropathies can be activated by stress, by a virus, compression or a number of things.   For me the MRI of the upper arm does not demonstrate any finds aside from the fluid collection which is not significant on physical exam.  I am not sure if this is related to the surgery but given the focal changes in the forearm it seems like less to be directly related to the sentinel node biopsy.  Dr. Amedeo Plenty recommended.  Steroid dose pack Gabapentin (already on) Nerve conduction study of left upper extremity with Dr. Jaynee Eagles, Neurology at [facility] [place] with duplex to rule out UE DVT  Appt with Dr. Amedeo Plenty in 3 weeks once above is completed.  We will work on the above.  Patient aware and appreciative.  Curlene Labrum, MD [facility] [address] [place], [place] [address] [phone] (office)

## 2018-05-22 ENCOUNTER — Other Ambulatory Visit (HOSPITAL_COMMUNITY): Payer: Self-pay | Admitting: General Surgery

## 2018-05-22 ENCOUNTER — Other Ambulatory Visit: Payer: Self-pay

## 2018-05-22 ENCOUNTER — Telehealth: Payer: Self-pay | Admitting: General Surgery

## 2018-05-22 ENCOUNTER — Other Ambulatory Visit: Payer: Self-pay | Admitting: Emergency Medicine

## 2018-05-22 ENCOUNTER — Ambulatory Visit (HOSPITAL_COMMUNITY)
Admission: RE | Admit: 2018-05-22 | Discharge: 2018-05-22 | Disposition: A | Discharge status: Home / Self Care | Payer: 59 | Source: Ambulatory Visit | Attending: General Surgery | Admitting: General Surgery

## 2018-05-22 DIAGNOSIS — L539 Erythematous condition, unspecified: Secondary | ICD-10-CM | POA: Insufficient documentation

## 2018-05-22 DIAGNOSIS — R609 Edema, unspecified: Secondary | ICD-10-CM | POA: Diagnosis not present

## 2018-05-22 DIAGNOSIS — O2230 Deep phlebothrombosis in pregnancy, unspecified trimester: Secondary | ICD-10-CM

## 2018-05-22 DIAGNOSIS — R600 Localized edema: Secondary | ICD-10-CM | POA: Diagnosis not present

## 2018-05-22 NOTE — Telephone Encounter (Signed)
[facility]  [place] LUE without DVT.   Patient reports pain is improving with gabapentin and steroid pack.   Plan for appt with Dr. Amedeo Plenty in the upcoming weeks. Trying to get the nerve conduction study ordered that he wanted with Dr. Jaynee Eagles.   Curlene Labrum, MD [facility] [address] [place], [place] [address] [phone] (office)

## 2018-05-26 ENCOUNTER — Ambulatory Visit: Payer: 59 | Admitting: General Surgery

## 2018-06-05 MED FILL — TAMOXIFEN CITRATE 20 MG TAB: 20 | 30 days supply | Qty: 30 | Fill #0

## 2018-06-11 DIAGNOSIS — I10 Essential (primary) hypertension: Secondary | ICD-10-CM | POA: Diagnosis not present

## 2018-06-12 ENCOUNTER — Encounter: Payer: 59 | Admitting: Neurology

## 2018-06-16 MED FILL — GABAPENTIN 100 MG CAPSULE: 100 | 30 days supply | Qty: 90 | Fill #0

## 2018-06-18 DIAGNOSIS — I10 Essential (primary) hypertension: Secondary | ICD-10-CM | POA: Diagnosis not present

## 2018-06-25 ENCOUNTER — Other Ambulatory Visit (HOSPITAL_COMMUNITY): Payer: 59

## 2018-06-25 ENCOUNTER — Encounter (HOSPITAL_COMMUNITY): Payer: 59 | Admitting: Genetic Counselor

## 2018-06-26 ENCOUNTER — Telehealth (HOSPITAL_COMMUNITY): Payer: Self-pay

## 2018-06-26 ENCOUNTER — Other Ambulatory Visit (HOSPITAL_COMMUNITY): Payer: Self-pay

## 2018-06-26 NOTE — Telephone Encounter (Signed)
Patient called with concerns after starting Tamoxifen she started having anxiety and panic attacks.  The PCP gave her Vistaril but is afraid to start something new until she spoke with Dr. Delton Coombes.  Reviewed the patients medications and concerns with Dr. Delton Coombes with verbal order ok to start the Vistaril and if she notices the anxiety continues to call us and we will try a different medication for her breast cancer.  Patient verbalized understanding.

## 2018-06-30 ENCOUNTER — Telehealth: Payer: Self-pay | Admitting: General Surgery

## 2018-06-30 DIAGNOSIS — F411 Generalized anxiety disorder: Secondary | ICD-10-CM | POA: Diagnosis not present

## 2018-06-30 DIAGNOSIS — Z853 Personal history of malignant neoplasm of breast: Secondary | ICD-10-CM | POA: Diagnosis not present

## 2018-06-30 DIAGNOSIS — I10 Essential (primary) hypertension: Secondary | ICD-10-CM | POA: Diagnosis not present

## 2018-06-30 NOTE — Telephone Encounter (Signed)
[facility]  I am calling the patient due to the current COVID 19 pandemic.  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 I had prescribed gabapentin 100mg  TID and a steroid pack. Her hand pain is much improved, and she only has some pain with squeezing her band on occassion.  She tried to wean her gabapentin to BID but noticed more pain in the hand. She canceled her appointment with Dr. Amedeo Plenty and her nerve conduction study due to [place] and is going to reschedule these herself. She reports having enough gabapentin to last her for now.   She will reschedule her appointments with Dr. Amedeo Plenty and the Nerve Conduction once the COVID is over. If she needs a refill on Gabapentin she will let us know.   Will see the patient PRN during this time. She will notify us with any issues or changes.   Curlene Labrum, MD [facility] [address] [place], [place] [address] [phone] (office)

## 2018-07-06 MED FILL — TAMOXIFEN CITRATE 20 MG TAB: 20 | 30 days supply | Qty: 30 | Fill #1

## 2018-07-06 MED FILL — ATORVASTATIN 20 MG TABLET: 20 | 60 days supply | Qty: 60 | Fill #0

## 2018-07-07 MED FILL — HYDROCHLOROTHIAZIDE 12.5 MG: 12.5 | 60 days supply | Qty: 60 | Fill #0

## 2018-07-10 MED FILL — HYDROXYZINE PAMOATE 25 MG C: 25 | 60 days supply | Qty: 180 | Fill #0

## 2018-07-14 DIAGNOSIS — I10 Essential (primary) hypertension: Secondary | ICD-10-CM | POA: Diagnosis not present

## 2018-07-14 DIAGNOSIS — R252 Cramp and spasm: Secondary | ICD-10-CM | POA: Diagnosis not present

## 2018-07-24 MED FILL — OLMESARTAN MEDOXOMIL 40 MG: 40 | 30 days supply | Qty: 30 | Fill #0

## 2018-07-27 ENCOUNTER — Other Ambulatory Visit: Payer: Self-pay | Admitting: General Surgery

## 2018-07-27 MED FILL — OMEPRAZOLE 40 MG CPDR: 40 | 90 days supply | Qty: 90 | Fill #0

## 2018-07-28 DIAGNOSIS — Z510 Encounter for antineoplastic radiation therapy: Secondary | ICD-10-CM | POA: Diagnosis not present

## 2018-07-28 DIAGNOSIS — C50912 Malignant neoplasm of unspecified site of left female breast: Secondary | ICD-10-CM | POA: Diagnosis not present

## 2018-07-28 DIAGNOSIS — D0512 Intraductal carcinoma in situ of left breast: Secondary | ICD-10-CM | POA: Diagnosis not present

## 2018-08-03 MED FILL — TAMOXIFEN CITRATE 20 MG TAB: 20 | 30 days supply | Qty: 30 | Fill #2

## 2018-08-04 MED FILL — GABAPENTIN 100 MG CAPSULE: 100 | 30 days supply | Qty: 90 | Fill #0

## 2018-08-06 ENCOUNTER — Other Ambulatory Visit: Payer: Self-pay

## 2018-08-06 ENCOUNTER — Ambulatory Visit (INDEPENDENT_AMBULATORY_CARE_PROVIDER_SITE_OTHER): Payer: 59 | Admitting: General Surgery

## 2018-08-06 ENCOUNTER — Encounter: Payer: Self-pay | Admitting: General Surgery

## 2018-08-06 DIAGNOSIS — C50912 Malignant neoplasm of unspecified site of left female breast: Secondary | ICD-10-CM | POA: Diagnosis not present

## 2018-08-06 DIAGNOSIS — G5610 Other lesions of median nerve, unspecified upper limb: Secondary | ICD-10-CM | POA: Diagnosis not present

## 2018-08-06 DIAGNOSIS — D0512 Intraductal carcinoma in situ of left breast: Secondary | ICD-10-CM | POA: Diagnosis not present

## 2018-08-06 DIAGNOSIS — Z510 Encounter for antineoplastic radiation therapy: Secondary | ICD-10-CM | POA: Diagnosis not present

## 2018-08-06 DIAGNOSIS — M792 Neuralgia and neuritis, unspecified: Secondary | ICD-10-CM

## 2018-08-06 NOTE — Progress Notes (Signed)
I connected with  Lorraine Peters on 08/06/18 by a telephone and verified that I am speaking with the correct person using two identifiers.   I discussed the limitations of evaluation and management by telemedicine. The patient expressed understanding and agreed to proceed.  I am calling the patient for follow up evaluation due to the current COVID 19 pandemic.  She had issues with hand pain and swelling after her left partial mastectomy with SLNB for L breast DCIS with high grade features.  We had done an MRI of the hand and there was some concern for anterior interosseus syndrome.  I had spoken with Dr. Amedeo Plenty and we ruled out a DVT with an [place] and did a round of steroids and gabapentin.  These measures seemed to help.  She continues to have pain after using her hand a lot, and has actually been off the gabapentin for the last 5 days because she ran out. I did refill her Rx and wanted to touch base with regards to her appt to see Dr. Amedeo Plenty, Hand Surgery, and to get her Nerve Conduction with Dr. Jaynee Eagles with Neurology.  She has been getting her radiation for her DCIS scheduled, and plans to get these appointments scheduled back once she knows the timing for the radiation.  She understands to get the Nerve Conduction scheduled prior to Dr. Amedeo Plenty appt.    Overall she is feeling better, and she is going to try and stay off the gabapentin.  If she starts hurting or having more problems, she will start this back. She will keep [place] up to date.    We discussed that billing is being done based on time of the phone call, and we spent 5 minutes on the phone.   Will see the patient PRN.  She will get her appts rescheduled and keep [place] up to date.   Curlene Labrum, MD [facility] [address] [place], [place] [address] [phone] (office)

## 2018-08-07 DIAGNOSIS — Z510 Encounter for antineoplastic radiation therapy: Secondary | ICD-10-CM | POA: Diagnosis not present

## 2018-08-07 DIAGNOSIS — D0512 Intraductal carcinoma in situ of left breast: Secondary | ICD-10-CM | POA: Diagnosis not present

## 2018-08-07 DIAGNOSIS — C50912 Malignant neoplasm of unspecified site of left female breast: Secondary | ICD-10-CM | POA: Diagnosis not present

## 2018-08-13 DIAGNOSIS — F411 Generalized anxiety disorder: Secondary | ICD-10-CM | POA: Diagnosis not present

## 2018-08-13 DIAGNOSIS — I10 Essential (primary) hypertension: Secondary | ICD-10-CM | POA: Diagnosis not present

## 2018-08-13 DIAGNOSIS — Z853 Personal history of malignant neoplasm of breast: Secondary | ICD-10-CM | POA: Diagnosis not present

## 2018-08-18 DIAGNOSIS — D0512 Intraductal carcinoma in situ of left breast: Secondary | ICD-10-CM | POA: Diagnosis not present

## 2018-08-18 DIAGNOSIS — C50912 Malignant neoplasm of unspecified site of left female breast: Secondary | ICD-10-CM | POA: Diagnosis not present

## 2018-08-18 DIAGNOSIS — Z510 Encounter for antineoplastic radiation therapy: Secondary | ICD-10-CM | POA: Diagnosis not present

## 2018-08-19 DIAGNOSIS — D0512 Intraductal carcinoma in situ of left breast: Secondary | ICD-10-CM | POA: Diagnosis not present

## 2018-08-19 DIAGNOSIS — Z510 Encounter for antineoplastic radiation therapy: Secondary | ICD-10-CM | POA: Diagnosis not present

## 2018-08-19 DIAGNOSIS — C50912 Malignant neoplasm of unspecified site of left female breast: Secondary | ICD-10-CM | POA: Diagnosis not present

## 2018-08-19 MED FILL — OLMESARTAN MEDOXOMIL 40 MG: 40 | 30 days supply | Qty: 30 | Fill #0

## 2018-08-20 DIAGNOSIS — Z510 Encounter for antineoplastic radiation therapy: Secondary | ICD-10-CM | POA: Diagnosis not present

## 2018-08-20 DIAGNOSIS — C50912 Malignant neoplasm of unspecified site of left female breast: Secondary | ICD-10-CM | POA: Diagnosis not present

## 2018-08-21 DIAGNOSIS — Z510 Encounter for antineoplastic radiation therapy: Secondary | ICD-10-CM | POA: Diagnosis not present

## 2018-08-21 DIAGNOSIS — C50912 Malignant neoplasm of unspecified site of left female breast: Secondary | ICD-10-CM | POA: Diagnosis not present

## 2018-08-24 DIAGNOSIS — C50912 Malignant neoplasm of unspecified site of left female breast: Secondary | ICD-10-CM | POA: Diagnosis not present

## 2018-08-24 DIAGNOSIS — Z510 Encounter for antineoplastic radiation therapy: Secondary | ICD-10-CM | POA: Diagnosis not present

## 2018-08-25 DIAGNOSIS — C50912 Malignant neoplasm of unspecified site of left female breast: Secondary | ICD-10-CM | POA: Diagnosis not present

## 2018-08-25 DIAGNOSIS — Z510 Encounter for antineoplastic radiation therapy: Secondary | ICD-10-CM | POA: Diagnosis not present

## 2018-08-26 DIAGNOSIS — C50912 Malignant neoplasm of unspecified site of left female breast: Secondary | ICD-10-CM | POA: Diagnosis not present

## 2018-08-26 DIAGNOSIS — D0512 Intraductal carcinoma in situ of left breast: Secondary | ICD-10-CM | POA: Diagnosis not present

## 2018-08-26 DIAGNOSIS — Z510 Encounter for antineoplastic radiation therapy: Secondary | ICD-10-CM | POA: Diagnosis not present

## 2018-08-27 DIAGNOSIS — Z510 Encounter for antineoplastic radiation therapy: Secondary | ICD-10-CM | POA: Diagnosis not present

## 2018-08-27 DIAGNOSIS — C50912 Malignant neoplasm of unspecified site of left female breast: Secondary | ICD-10-CM | POA: Diagnosis not present

## 2018-08-28 DIAGNOSIS — Z510 Encounter for antineoplastic radiation therapy: Secondary | ICD-10-CM | POA: Diagnosis not present

## 2018-08-28 DIAGNOSIS — C50912 Malignant neoplasm of unspecified site of left female breast: Secondary | ICD-10-CM | POA: Diagnosis not present

## 2018-08-29 MED FILL — TAMOXIFEN CITRATE 20 MG TAB: 20 | 30 days supply | Qty: 30 | Fill #3

## 2018-08-31 DIAGNOSIS — C50912 Malignant neoplasm of unspecified site of left female breast: Secondary | ICD-10-CM | POA: Diagnosis not present

## 2018-08-31 DIAGNOSIS — Z510 Encounter for antineoplastic radiation therapy: Secondary | ICD-10-CM | POA: Diagnosis not present

## 2018-09-01 DIAGNOSIS — C50912 Malignant neoplasm of unspecified site of left female breast: Secondary | ICD-10-CM | POA: Diagnosis not present

## 2018-09-01 DIAGNOSIS — Z510 Encounter for antineoplastic radiation therapy: Secondary | ICD-10-CM | POA: Diagnosis not present

## 2018-09-01 MED FILL — HYDROCHLOROTHIAZIDE 12.5 MG: 12.5 | 90 days supply | Qty: 90 | Fill #0

## 2018-09-02 DIAGNOSIS — D0512 Intraductal carcinoma in situ of left breast: Secondary | ICD-10-CM | POA: Diagnosis not present

## 2018-09-02 DIAGNOSIS — C50912 Malignant neoplasm of unspecified site of left female breast: Secondary | ICD-10-CM | POA: Diagnosis not present

## 2018-09-02 DIAGNOSIS — Z510 Encounter for antineoplastic radiation therapy: Secondary | ICD-10-CM | POA: Diagnosis not present

## 2018-09-03 DIAGNOSIS — Z510 Encounter for antineoplastic radiation therapy: Secondary | ICD-10-CM | POA: Diagnosis not present

## 2018-09-03 DIAGNOSIS — C50912 Malignant neoplasm of unspecified site of left female breast: Secondary | ICD-10-CM | POA: Diagnosis not present

## 2018-09-03 DIAGNOSIS — D0512 Intraductal carcinoma in situ of left breast: Secondary | ICD-10-CM | POA: Diagnosis not present

## 2018-09-03 MED FILL — HYDROXYZINE PAMOATE 25 MG C: 25 | 60 days supply | Qty: 180 | Fill #1

## 2018-09-04 DIAGNOSIS — C50912 Malignant neoplasm of unspecified site of left female breast: Secondary | ICD-10-CM | POA: Diagnosis not present

## 2018-09-04 DIAGNOSIS — Z510 Encounter for antineoplastic radiation therapy: Secondary | ICD-10-CM | POA: Diagnosis not present

## 2018-09-07 DIAGNOSIS — Z510 Encounter for antineoplastic radiation therapy: Secondary | ICD-10-CM | POA: Diagnosis not present

## 2018-09-07 DIAGNOSIS — C50912 Malignant neoplasm of unspecified site of left female breast: Secondary | ICD-10-CM | POA: Diagnosis not present

## 2018-09-08 DIAGNOSIS — Z510 Encounter for antineoplastic radiation therapy: Secondary | ICD-10-CM | POA: Diagnosis not present

## 2018-09-08 DIAGNOSIS — C50912 Malignant neoplasm of unspecified site of left female breast: Secondary | ICD-10-CM | POA: Diagnosis not present

## 2018-09-09 DIAGNOSIS — Z510 Encounter for antineoplastic radiation therapy: Secondary | ICD-10-CM | POA: Diagnosis not present

## 2018-09-09 DIAGNOSIS — D0512 Intraductal carcinoma in situ of left breast: Secondary | ICD-10-CM | POA: Diagnosis not present

## 2018-09-09 DIAGNOSIS — C50912 Malignant neoplasm of unspecified site of left female breast: Secondary | ICD-10-CM | POA: Diagnosis not present

## 2018-09-10 DIAGNOSIS — D0512 Intraductal carcinoma in situ of left breast: Secondary | ICD-10-CM | POA: Diagnosis not present

## 2018-09-10 DIAGNOSIS — C50912 Malignant neoplasm of unspecified site of left female breast: Secondary | ICD-10-CM | POA: Diagnosis not present

## 2018-09-10 DIAGNOSIS — Z510 Encounter for antineoplastic radiation therapy: Secondary | ICD-10-CM | POA: Diagnosis not present

## 2018-09-14 DIAGNOSIS — C50912 Malignant neoplasm of unspecified site of left female breast: Secondary | ICD-10-CM | POA: Diagnosis not present

## 2018-09-14 DIAGNOSIS — I10 Essential (primary) hypertension: Secondary | ICD-10-CM | POA: Diagnosis not present

## 2018-09-14 DIAGNOSIS — Z510 Encounter for antineoplastic radiation therapy: Secondary | ICD-10-CM | POA: Diagnosis not present

## 2018-09-14 DIAGNOSIS — E782 Mixed hyperlipidemia: Secondary | ICD-10-CM | POA: Diagnosis not present

## 2018-09-14 MED FILL — ATORVASTATIN 20 MG TABLET: 20 | 60 days supply | Qty: 60 | Fill #0

## 2018-09-15 DIAGNOSIS — Z510 Encounter for antineoplastic radiation therapy: Secondary | ICD-10-CM | POA: Diagnosis not present

## 2018-09-15 DIAGNOSIS — C50912 Malignant neoplasm of unspecified site of left female breast: Secondary | ICD-10-CM | POA: Diagnosis not present

## 2018-09-16 DIAGNOSIS — F411 Generalized anxiety disorder: Secondary | ICD-10-CM | POA: Diagnosis not present

## 2018-09-16 DIAGNOSIS — Z510 Encounter for antineoplastic radiation therapy: Secondary | ICD-10-CM | POA: Diagnosis not present

## 2018-09-16 DIAGNOSIS — R7301 Impaired fasting glucose: Secondary | ICD-10-CM | POA: Diagnosis not present

## 2018-09-16 DIAGNOSIS — R0982 Postnasal drip: Secondary | ICD-10-CM | POA: Diagnosis not present

## 2018-09-16 DIAGNOSIS — E669 Obesity, unspecified: Secondary | ICD-10-CM | POA: Diagnosis not present

## 2018-09-16 DIAGNOSIS — Z853 Personal history of malignant neoplasm of breast: Secondary | ICD-10-CM | POA: Diagnosis not present

## 2018-09-16 DIAGNOSIS — K219 Gastro-esophageal reflux disease without esophagitis: Secondary | ICD-10-CM | POA: Diagnosis not present

## 2018-09-16 DIAGNOSIS — Z124 Encounter for screening for malignant neoplasm of cervix: Secondary | ICD-10-CM | POA: Diagnosis not present

## 2018-09-16 DIAGNOSIS — H6990 Unspecified Eustachian tube disorder, unspecified ear: Secondary | ICD-10-CM | POA: Diagnosis not present

## 2018-09-16 DIAGNOSIS — R42 Dizziness and giddiness: Secondary | ICD-10-CM | POA: Diagnosis not present

## 2018-09-16 DIAGNOSIS — M62831 Muscle spasm of calf: Secondary | ICD-10-CM | POA: Diagnosis not present

## 2018-09-16 DIAGNOSIS — E782 Mixed hyperlipidemia: Secondary | ICD-10-CM | POA: Diagnosis not present

## 2018-09-16 DIAGNOSIS — C50912 Malignant neoplasm of unspecified site of left female breast: Secondary | ICD-10-CM | POA: Diagnosis not present

## 2018-09-16 DIAGNOSIS — I10 Essential (primary) hypertension: Secondary | ICD-10-CM | POA: Diagnosis not present

## 2018-09-16 DIAGNOSIS — Z0001 Encounter for general adult medical examination with abnormal findings: Secondary | ICD-10-CM | POA: Diagnosis not present

## 2018-09-16 DIAGNOSIS — Z0000 Encounter for general adult medical examination without abnormal findings: Secondary | ICD-10-CM | POA: Diagnosis not present

## 2018-09-16 DIAGNOSIS — D0512 Intraductal carcinoma in situ of left breast: Secondary | ICD-10-CM | POA: Diagnosis not present

## 2018-09-16 DIAGNOSIS — R252 Cramp and spasm: Secondary | ICD-10-CM | POA: Diagnosis not present

## 2018-09-17 DIAGNOSIS — C50912 Malignant neoplasm of unspecified site of left female breast: Secondary | ICD-10-CM | POA: Diagnosis not present

## 2018-09-17 DIAGNOSIS — Z510 Encounter for antineoplastic radiation therapy: Secondary | ICD-10-CM | POA: Diagnosis not present

## 2018-09-17 MED FILL — LEVOCETIRIZINE 5 MG TABLET: 5 | 90 days supply | Qty: 90 | Fill #0

## 2018-09-18 DIAGNOSIS — Z510 Encounter for antineoplastic radiation therapy: Secondary | ICD-10-CM | POA: Diagnosis not present

## 2018-09-18 DIAGNOSIS — C50912 Malignant neoplasm of unspecified site of left female breast: Secondary | ICD-10-CM | POA: Diagnosis not present

## 2018-09-21 MED FILL — OLMESARTAN MEDOXOMIL 40 MG: 40 | 30 days supply | Qty: 30 | Fill #1

## 2018-09-25 ENCOUNTER — Encounter: Payer: Self-pay | Admitting: *Deleted

## 2018-09-28 MED FILL — TAMOXIFEN CITRATE 20 MG TAB: 20 | 30 days supply | Qty: 30 | Fill #4

## 2018-09-29 DIAGNOSIS — Z760 Encounter for issue of repeat prescription: Secondary | ICD-10-CM | POA: Diagnosis not present

## 2018-10-14 ENCOUNTER — Ambulatory Visit: Payer: 59

## 2018-10-14 DIAGNOSIS — D0512 Intraductal carcinoma in situ of left breast: Secondary | ICD-10-CM | POA: Diagnosis not present

## 2018-10-14 DIAGNOSIS — Z510 Encounter for antineoplastic radiation therapy: Secondary | ICD-10-CM | POA: Diagnosis not present

## 2018-10-14 DIAGNOSIS — C50912 Malignant neoplasm of unspecified site of left female breast: Secondary | ICD-10-CM | POA: Diagnosis not present

## 2018-10-15 ENCOUNTER — Encounter (HOSPITAL_COMMUNITY): Payer: Self-pay | Admitting: Genetic Counselor

## 2018-10-15 ENCOUNTER — Inpatient Hospital Stay (HOSPITAL_COMMUNITY): Payer: 59 | Attending: Genetic Counselor | Admitting: Genetic Counselor

## 2018-10-15 ENCOUNTER — Inpatient Hospital Stay (HOSPITAL_COMMUNITY): Payer: 59

## 2018-10-15 ENCOUNTER — Other Ambulatory Visit: Payer: Self-pay

## 2018-10-15 DIAGNOSIS — Z803 Family history of malignant neoplasm of breast: Secondary | ICD-10-CM

## 2018-10-15 DIAGNOSIS — Z800 Family history of malignant neoplasm of digestive organs: Secondary | ICD-10-CM

## 2018-10-15 DIAGNOSIS — D0512 Intraductal carcinoma in situ of left breast: Secondary | ICD-10-CM | POA: Diagnosis not present

## 2018-10-15 NOTE — Progress Notes (Deleted)
REFERRING PROVIDER: Celene Squibb, MD [place],  [place] [address]  PRIMARY PROVIDER:  Celene Squibb, MD  PRIMARY REASON FOR VISIT:  1. Family history of breast cancer   2. Family history of colon cancer      HISTORY OF PRESENT ILLNESS:   Lorraine Peters, a 48 y.o. female, was seen for a [place] cancer genetics consultation at the request of Dr. Nevada Crane due to a {Personal/family:20331} history of {cancer/polyps}.  Lorraine Peters presents to clinic today to discuss the possibility of a hereditary predisposition to cancer, genetic testing, and to further clarify her future cancer risks, as well as potential cancer risks for family members.   In ***, at the age of ***, Ms. Nigg was diagnosed with {CA PATHOLOGY:63853} of the {right left (wildcard):15202} {CA QPYPP:50932}. The treatment plan ***.    *** Lorraine Peters is a 48 y.o. female with no personal history of cancer.    CANCER HISTORY:  Oncology History   No history exists.     RISK FACTORS:  Menarche was at age ***.  First live birth at age ***.  OCP use for approximately {Numbers 1-12 multi-select:20307} years.  Ovaries intact: {Yes/No-Ex:120004}.  Hysterectomy: {Yes/No-Ex:120004}.  Menopausal status: {Menopause:31378}.  HRT use: {Numbers 1-12 multi-select:20307} years. Colonoscopy: {Yes/No-Ex:120004}; {normal/abnormal/not examined:14677}. Mammogram within the last year: {Yes/No-Ex:120004}. Number of breast biopsies: {Numbers 1-12 multi-select:20307}. Up to date with pelvic exams: {Yes/No-Ex:120004}. Any excessive radiation exposure in the past: {Yes/No-Ex:120004}  Past Medical History:  Diagnosis Date  . Breast cancer ([place])   . Family history of breast cancer   . Family history of colon cancer   . History of kidney stones   . Hypercholesterolemia   . Migraine   . Ovarian cyst   . PONV (postoperative nausea and vomiting)   . Reflux     Past Surgical History:  Procedure Laterality Date  . ESOPHAGOGASTRODUODENOSCOPY  N/A 09/12/2017   Procedure: ESOPHAGOGASTRODUODENOSCOPY (EGD);  Surgeon: Daneil Dolin, MD;  [facility];  Service: Endoscopy;  Laterality: N/A;  2:30pm  . PARTIAL MASTECTOMY WITH NEEDLE LOCALIZATION AND AXILLARY SENTINEL LYMPH NODE BX Left 04/29/2018   Procedure: PARTIAL MASTECTOMY WITH NEEDLE LOCALIZATION AND AXILLARY SENTINEL LYMPH NODE BX;  Surgeon: Virl Cagey, MD;  Location: [facility];  Service: General;  Laterality: Left;  . TUBAL LIGATION      Social History   Socioeconomic History  . Marital status: Divorced    Spouse name: Not on file  . Number of children: 1  . Years of education: Not on file  . Highest education level: Not on file  Occupational History  . Not on file  Social Needs  . Financial resource strain: Not hard at all  . Food insecurity    Worry: Never true    Inability: Never true  . Transportation needs    Medical: No    Non-medical: No  Tobacco Use  . Smoking status: Former Smoker    Packs/day: 0.50    Years: 28.00    Pack years: 14.00    Types: Cigarettes    Quit date: 04/22/2018    Years since quitting: 0.4  . Smokeless tobacco: Never Used  Substance and Sexual Activity  . Alcohol use: No    Alcohol/week: 0.0 standard drinks  . Drug use: No  . Sexual activity: Yes    Birth control/protection: Surgical  Lifestyle  . Physical activity    Days per week: 3 days    Minutes per  session: 20 min  . Stress: Not at all  Relationships  . Social connections    Talks on phone: More than three times a week    Gets together: Three times a week    Attends religious service: More than 4 times per year    Active member of club or organization: No    Attends meetings of clubs or organizations: Never    Relationship status: Divorced  Other Topics Concern  . Not on file  Social History Narrative  . Not on file     FAMILY HISTORY:  We obtained a detailed, 4-generation family history.  Significant diagnoses are listed below: Family History   Problem Relation Age of Onset  . Arthritis Mother   . Cancer Mother        breast  . Breast cancer Mother 24       recurrance at 41  . COPD Father   . Heart disease Father   . Hyperlipidemia Father   . Hypertension Father   . Cancer Sister 54       anal, deceased due to mva but had advanced disease  . Stroke Maternal Grandfather   . Breast cancer Maternal Aunt 58  . Breast cancer Maternal Uncle        colon    Lorraine Peters is {aware/unaware} of previous family history of genetic testing for hereditary cancer risks. Patient's maternal ancestors are of *** descent, and paternal ancestors are of *** descent. There {IS NO:12509} reported Ashkenazi Jewish ancestry. There {IS NO:12509} known consanguinity.  GENETIC COUNSELING ASSESSMENT: [name] is a 48 y.o. female with a {Personal/family:20331} history of {cancer/polyps} which is somewhat suggestive of a {DISEASE} and predisposition to cancer given ***. We, therefore, discussed and recommended the following at today's visit.   DISCUSSION: We discussed that *** - ***% of *** is hereditary, with most cases associated with ***.  There are other genes that can be associated with hereditary *** cancer syndromes.  These include ***.  We discussed that testing is beneficial for several reasons including knowing how to follow individuals after completing their treatment, identifying whether potential treatment options such as PARP inhibitors would be beneficial, and understand if other family members could be at risk for cancer and allow them to undergo genetic testing.   We reviewed the characteristics, features and inheritance patterns of hereditary cancer syndromes. We also discussed genetic testing, including the appropriate family members to test, the process of testing, insurance coverage and turn-around-time for results. We discussed the implications of a negative, positive and/or variant of uncertain significant result. We recommended [name]  pursue genetic testing for the *** gene panel.   Based on [name]'s {Personal/family:20331} history of cancer, she meets medical criteria for genetic testing. Despite that she meets criteria, she may still have an out of pocket cost. We discussed that if her out of pocket cost for testing is over $100, the laboratory will call and confirm whether she wants to proceed with testing.  If the out of pocket cost of testing is less than $100 she will be billed by the genetic testing laboratory.   ***We reviewed the characteristics, features and inheritance patterns of hereditary cancer syndromes. We also discussed genetic testing, including the appropriate family members to test, the process of testing, insurance coverage and turn-around-time for results. We discussed the implications of a negative, positive and/or variant of uncertain significant result. In order to get genetic test results in a timely manner so that Ms.  Cala can use these genetic test results for surgical decisions, we recommended Ms. Ysaguirre pursue genetic testing for the ***. Once complete, we recommend Ms. Beaumier pursue reflex genetic testing to the *** gene panel.   Based on Ms. Denomme's {Personal/family:20331} history of cancer, she meets medical criteria for genetic testing. Despite that she meets criteria, she may still have an out of pocket cost.   ***We discussed with Ms. Bera that the {Personal/family:20331} history does not meet insurance or NCCN criteria for genetic testing and, therefore, is not highly consistent with a familial hereditary cancer syndrome.  We feel she is at low risk to harbor a gene mutation associated with such a condition. Thus, we did not recommend any genetic testing, at this time, and recommended Ms. Mcwright continue to follow the cancer screening guidelines given by her primary healthcare provider.  ***In order to estimate her chance of having a {CA GENE:62345} mutation, we used statistical models  ({GENMODELS:62370}) that consider her personal medical history, family history and ancestry.  Because each model is different, there can be a lot of variability in the risks they give.  Therefore, these numbers must be considered a rough range and not a precise risk of having a {CA GENE:62345} mutation.  These models estimate that she has approximately a ***-***% chance of having a mutation. Based on this assessment of her family and personal history, genetic testing {IS/ISNOT:34056} recommended.  ***Based on the patient's {Personal/family:20331} history, a statistical model ({GENMODELS:62370}) was used to estimate her risk of developing {CA HX:54794}. This estimates her lifetime risk of developing {CA HX:54794} to be approximately ***%. This estimation does not consider any genetic testing results.  The patient's lifetime breast cancer risk is a preliminary estimate based on available information using one of several models endorsed by the [place] (ACS). The ACS recommends consideration of breast MRI screening as an adjunct to mammography for patients at high risk (defined as 20% or greater lifetime risk).   ***Ms. Damewood has been determined to be at high risk for breast cancer.  Therefore, we recommend that annual screening with mammography and breast MRI be performed.  ***begin at age 53, or 10 years prior to the age of breast cancer diagnosis in a relative (whichever is earlier).  We discussed that Ms. Wigfall should discuss her individual situation with her referring physician and determine a breast cancer screening plan with which they are both comfortable.    PLAN: After considering the risks, benefits, and limitations, Ms. Gabrielle provided informed consent to pursue genetic testing and the blood sample was sent to {Lab} Laboratories for analysis of the {test}. Results should be available within approximately {TAT TIME} weeks' time, at which point they will be disclosed by telephone to Ms. Budzinski,  as will any additional recommendations warranted by these results. Ms. Stough will receive a summary of her genetic counseling visit and a copy of her results once available. This information will also be available in Epic.   *** Despite our recommendation, Ms. Asby did not wish to pursue genetic testing at today's visit. We understand this decision and remain available to coordinate genetic testing at any time in the future. We, therefore, recommend Ms. Diluzio continue to follow the cancer screening guidelines given by her primary healthcare provider.  ***Based on Ms. Barrington's family history, we recommended her ***, who was diagnosed with *** at age ***, have genetic counseling and testing. Ms. Leath will let us know if we can be of any assistance in  coordinating genetic counseling and/or testing for this family member.   Lastly, we encouraged Ms. Diveley to remain in contact with cancer genetics annually so that we can continuously update the family history and inform her of any changes in cancer genetics and testing that may be of benefit for this family.   Ms. Tash questions were answered to her satisfaction today. Our contact information was provided should additional questions or concerns arise. Thank you for the referral and allowing [place] to share in the care of your patient.   Terreon Ekholm P. Florene Glen, [place], [facility] Licensed, Insurance risk surveyor [email or web address] phone: [phone]  The patient was seen for a total of *** minutes in face-to-face genetic counseling.  This patient was discussed with Drs. Magrinat, Lindi Adie and/or Burr Medico who agrees with the above.    _______________________________________________________________________ For Office Staff:  Number of people involved in session: *** Was an Intern/ student involved with case: {YES/NO:63}

## 2018-10-15 NOTE — Progress Notes (Signed)
REFERRING PROVIDER: Derek Jack, MD [address] [place],  [place] [address]  PRIMARY PROVIDER:  Celene Squibb, MD  PRIMARY REASON FOR VISIT:  1. Family history of breast cancer   2. Family history of colon cancer      HISTORY OF PRESENT ILLNESS:   Lorraine Peters, a 48 y.o. female, was seen for a [place] cancer genetics consultation at the request of Dr. Delton Coombes due to a personal and family history of breast cancer.  Lorraine Peters presents to clinic today to discuss the possibility of a hereditary predisposition to cancer, genetic testing, and to further clarify her future cancer risks, as well as potential cancer risks for family members.   In February 2020, at the age of 47, Lorraine Peters was diagnosed with DCIS of the left breast. The treatment plan included a partial mastectomy of the left breast and radiation.  She reports that her mother had genetic testing and was negative.  Although we do not have those results.      CANCER HISTORY:  Oncology History   No history exists.     RISK FACTORS:  Menarche was at age 78.  First live birth at age. 25 Ovaries intact: yes.  Hysterectomy: no.  Menopausal status: perimenopausal.  HRT use: 0 years. Colonoscopy: no; not examined. Mammogram within the last year: yes. Number of breast biopsies: 1. Up to date with pelvic exams: yes. Any excessive radiation exposure in the past: no  Past Medical History:  Diagnosis Date   Breast cancer ([place])    Family history of breast cancer    Family history of colon cancer    History of kidney stones    Hypercholesterolemia    Migraine    Ovarian cyst    PONV (postoperative nausea and vomiting)    Reflux     Past Surgical History:  Procedure Laterality Date   ESOPHAGOGASTRODUODENOSCOPY N/A 09/12/2017   Procedure: ESOPHAGOGASTRODUODENOSCOPY (EGD);  Surgeon: Daneil Dolin, MD;  [facility];  Service: Endoscopy;  Laterality: N/A;  2:30pm   PARTIAL MASTECTOMY WITH NEEDLE  LOCALIZATION AND AXILLARY SENTINEL LYMPH NODE BX Left 04/29/2018   Procedure: PARTIAL MASTECTOMY WITH NEEDLE LOCALIZATION AND AXILLARY SENTINEL LYMPH NODE BX;  Surgeon: Virl Cagey, MD;  Location: [facility];  Service: General;  Laterality: Left;   TUBAL LIGATION      Social History   Socioeconomic History   Marital status: Divorced    Spouse name: Not on file   Number of children: 1   Years of education: Not on file   Highest education level: Not on file  Occupational History   Not on file  Social Needs   Financial resource strain: Not hard at all   Food insecurity    Worry: Never true    Inability: Never true   Transportation needs    Medical: No    Non-medical: No  Tobacco Use   Smoking status: Former Smoker    Packs/day: 0.50    Years: 28.00    Pack years: 14.00    Types: Cigarettes    Quit date: 04/22/2018    Years since quitting: 0.4   Smokeless tobacco: Never Used  Substance and Sexual Activity   Alcohol use: No    Alcohol/week: 0.0 standard drinks   Drug use: No   Sexual activity: Yes    Birth control/protection: Surgical  Lifestyle   Physical activity    Days per week: 3 days    Minutes per session: 20  min   Stress: Not at all  Relationships   Social connections    Talks on phone: More than three times a week    Gets together: Three times a week    Attends religious service: More than 4 times per year    Active member of club or organization: No    Attends meetings of clubs or organizations: Never    Relationship status: Divorced  Other Topics Concern   Not on file  Social History Narrative   Not on file     FAMILY HISTORY:  We obtained a detailed, 4-generation family history.  Significant diagnoses are listed below: Family History  Problem Relation Age of Onset   Arthritis Mother    Cancer Mother        breast   Breast cancer Mother 102       recurrance at 77   COPD Father    Heart disease Father    Hyperlipidemia  Father    Hypertension Father    Cancer Sister 18       anal, deceased due to mva but had advanced disease   Stroke Maternal Grandfather    Breast cancer Maternal Aunt 58   Breast cancer Maternal Uncle        colon     The patient has one daughter who is cancer free.  She hast two sisters, one who had anal cancer at 29 and died at 71 in a car accident.  Both parents are deceased.  The patient's father died at 55 from natural causes.  He had 11 brothers and one sister who are cancer free.  His parents also died of natural causes.  The patient's mother had breast cancer at 50 and 49 and diagnosed with lung cancer at 45.  She had two siblings, a brother and sister.  The sister had breast cancer in her 81's and the brother had colon cancer in his 44's.  The maternal grandparents were reportedly cancer free.  Lorraine Peters is aware of previous family history of genetic testing for hereditary cancer risks in her mother which were reportedly negative.. Patient's maternal ancestors are of Caucasian descent, and paternal ancestors are of Caucasian descent. There is no reported Ashkenazi Jewish ancestry. There is no known consanguinity.  GENETIC COUNSELING ASSESSMENT: Lorraine Peters is a 48 y.o. female with a personal and family history of breast cancer which is somewhat suggestive of a hereditary cancer syndrome and predisposition to cancer given her young age of onset and the number of family members with breast cancer. We, therefore, discussed and recommended the following at today's visit.   DISCUSSION: We discussed that 5 - 10% of breast cancer is hereditary, with most cases associated with BRCA mutations.  There are other genes that can be associated with hereditary breast cancer syndromes.  We discussed that testing is beneficial for several reasons including knowing how to follow individuals after completing their treatment, and understand if other family members could be at risk for cancer and allow  them to undergo genetic testing.   We reviewed the characteristics, features and inheritance patterns of hereditary cancer syndromes. We also discussed genetic testing, including the appropriate family members to test, the process of testing, insurance coverage and turn-around-time for results. We discussed the implications of a negative, positive and/or variant of uncertain significant result. We recommended Lorraine Peters pursue genetic testing for the common hereditary gene panel. The Common Hereditary Gene Panel offered by [facility] includes sequencing and/or deletion duplication testing of  the following 48 genes: APC, ATM, AXIN2, BARD1, BMPR1A, BRCA1, BRCA2, BRIP1, CDH1, CDK4, CDKN2A (p14ARF), CDKN2A (p16INK4a), CHEK2, CTNNA1, DICER1, EPCAM (Deletion/duplication testing only), GREM1 (promoter region deletion/duplication testing only), KIT, MEN1, MLH1, MSH2, MSH3, MSH6, MUTYH, NBN, NF1, NHTL1, PALB2, PDGFRA, PMS2, POLD1, POLE, PTEN, RAD50, RAD51C, RAD51D, RNF43, SDHB, SDHC, SDHD, SMAD4, SMARCA4. STK11, TP53, TSC1, TSC2, and VHL.  The following genes were evaluated for sequence changes only: SDHA and HOXB13 c.251G>A variant only.   Based on Lorraine Peters's personal and family history of cancer, she meets medical criteria for genetic testing. Despite that she meets criteria, she may still have an out of pocket cost. We discussed that if her out of pocket cost for testing is over $100, the laboratory will call and confirm whether she wants to proceed with testing.  If the out of pocket cost of testing is less than $100 she will be billed by the genetic testing laboratory.   PLAN: After considering the risks, benefits, and limitations, Lorraine Peters provided informed consent to pursue genetic testing and the blood sample was sent to [facility] for analysis of the Common Hereditary cancer panel. Results should be available within approximately 2-3 weeks' time, at which point they will be disclosed by telephone to Ms.  Peters, as will any additional recommendations warranted by these results. Lorraine Peters will receive a summary of her genetic counseling visit and a copy of her results once available. This information will also be available in Epic.   Lastly, we encouraged Lorraine Peters to remain in contact with cancer genetics annually so that we can continuously update the family history and inform her of any changes in cancer genetics and testing that may be of benefit for this family.   Lorraine Peters questions were answered to her satisfaction today. Our contact information was provided should additional questions or concerns arise. Thank you for the referral and allowing [place] to share in the care of your patient.   Wreatha Sturgeon P. Florene Glen, [place], [facility] Licensed, Insurance risk surveyor [email or web address] phone: [phone]  The patient was seen for a total of 57 minutes in face-to-face genetic counseling.  This patient was discussed with Drs. Magrinat, Lindi Adie and/or Burr Medico who agrees with the above.    _______________________________________________________________________ For Office Staff:  Number of people involved in session: 1 Was an Intern/ student involved with case: no

## 2018-10-16 DIAGNOSIS — I10 Essential (primary) hypertension: Secondary | ICD-10-CM | POA: Diagnosis not present

## 2018-10-20 MED FILL — OMEPRAZOLE 40 MG CPDR: 40 | 90 days supply | Qty: 90 | Fill #0

## 2018-10-22 ENCOUNTER — Ambulatory Visit (INDEPENDENT_AMBULATORY_CARE_PROVIDER_SITE_OTHER): Payer: 59 | Admitting: *Deleted

## 2018-10-22 ENCOUNTER — Encounter (HOSPITAL_COMMUNITY): Payer: 59 | Admitting: Genetic Counselor

## 2018-10-22 ENCOUNTER — Other Ambulatory Visit: Payer: Self-pay

## 2018-10-22 ENCOUNTER — Other Ambulatory Visit (HOSPITAL_COMMUNITY): Payer: 59

## 2018-10-22 DIAGNOSIS — Z1211 Encounter for screening for malignant neoplasm of colon: Secondary | ICD-10-CM

## 2018-10-22 MED ORDER — NA SULFATE-K SULFATE-MG SULF 17.5-3.13-1.6 GM/177ML PO SOLN: 1.0000 | Freq: Once | ORAL | 0 refills | Status: AC

## 2018-10-22 NOTE — Progress Notes (Addendum)
Gastroenterology Pre-Procedure Review  Request Date: 10/22/2018 Requesting Physician: Dr. Wende Neighbors, No previous TCS  PATIENT REVIEW QUESTIONS: The patient responded to the following health history questions as indicated:    1. Diabetes Melitis: No 2. Joint replacements in the past 12 months: No 3. Major health problems in the past 3 months: Yes, finished last radiation treatment 09/18/2018 4. Has an artificial valve or MVP: No 5. Has a defibrillator: No 6. Has been advised in past to take antibiotics in advance of a procedure like teeth cleaning: No 7. Family history of colon cancer: No 8. Alcohol Use: No 9. History of sleep apnea: No  10. History of coronary artery or other vascular stents placed within the last 12 months: No 11. History of any prior anesthesia complications: Yes, nausea and vomiting    MEDICATIONS & ALLERGIES:    Patient reports the following regarding taking any blood thinners:   Plavix? No Aspirin? No Coumadin? No Brilinta? No Xarelto? No Eliquis? No Pradaxa? No Savaysa? No Effient? No  Patient confirms/reports the following medications:  Current Outpatient Medications  Medication Sig Dispense Refill  . albuterol (PROVENTIL HFA;VENTOLIN HFA) 108 (90 Base) MCG/ACT inhaler Inhale 2 puffs into the lungs every 6 (six) hours as needed for wheezing or shortness of breath. (Patient taking differently: Inhale 2 puffs into the lungs as needed for wheezing or shortness of breath. ) 1 Inhaler 0  . atorvastatin (LIPITOR) 20 MG tablet TAKE 1 Tablet BY MOUTH ONCE DAILY (Patient taking differently: Take 10 mg by mouth at bedtime. ) 90 tablet 2  . hydrochlorothiazide (HYDRODIURIL) 25 MG tablet Take 25 mg by mouth daily.    . hydrOXYzine (VISTARIL) 25 MG capsule Take 25 mg by mouth as needed.    Marland Kitchen levocetirizine (XYZAL) 5 MG tablet Take 5 mg by mouth every evening.    . Magnesium 250 MG TABS Take by mouth as needed.    . Multiple Vitamins-Minerals (VITAMIN D3 COMPLETE PO)  Take by mouth daily. Takes 1200 mg    . olmesartan (BENICAR) 40 MG tablet Take 40 mg by mouth daily.    Marland Kitchen omeprazole (PRILOSEC) 40 MG capsule Take 40 mg by mouth daily.    . tamoxifen (NOLVADEX) 20 MG tablet Take 1 tablet (20 mg total) by mouth daily. 30 tablet 6   No current facility-administered medications for this visit.     Patient confirms/reports the following allergies:  Allergies  Allergen Reactions  . Bee Venom Swelling and Other (See Comments)    Severe swelling    No orders of the defined types were placed in this encounter.   AUTHORIZATION INFORMATION Primary Insurance: [place],  [place] #: [id number],  Group #: [id number] Pre-Cert / Josem Kaufmann required: Not required per Dortha Schwalbe / Auth #: Ref # [id number]  SCHEDULE INFORMATION: Procedure has been scheduled as follows:  Date: 12/30/2018 , Time: 7:30 Location: [facility] with Dr. Gala Romney  This Gastroenterology Pre-Precedure Review Form is being routed to the following provider(s): Walden Field, NP

## 2018-10-22 NOTE — Progress Notes (Signed)
Ok to schedule.  Notify endoscopy about N/V so they're aware of possible need for antiemetic

## 2018-10-23 NOTE — Patient Instructions (Signed)
Lorraine Peters  1970/12/26 MRN: [id number]     Procedure Date: 12/30/2018 Time to register: 6:30 am Place to register: [facility] Procedure Time: 7:30 am Scheduled provider: Dr. Gala Romney    PREPARATION FOR COLONOSCOPY WITH SUPREP BOWEL PREP KIT  Note: Suprep Bowel Prep Kit is a split-dose (2day) regimen. Consumption of BOTH 6-ounce bottles is required for a complete prep.  Please notify us immediately if you are diabetic, take iron supplements, or if you are on Coumadin or any other blood thinners.                                                                                                                                                  2 DAYS BEFORE PROCEDURE:  DATE: 12/28/2018  DAY: Monday Begin clear liquid diet AFTER your lunch meal. NO SOLID FOODS after this point.  1 DAY BEFORE PROCEDURE:  DATE: 12/29/2018  DAY: Tuesday Continue clear liquids the entire day - NO SOLID FOOD.    At 6:00pm: Complete steps 1 through 4 below, using ONE (1) 6-ounce bottle, before going to bed. Step 1:  Pour ONE (1) 6-ounce bottle of SUPREP liquid into the mixing container.  Step 2:  Add cool drinking water to the 16 ounce line on the container and mix.  Note: Dilute the solution concentrate as directed prior to use. Step 3:  DRINK ALL the liquid in the container. Step 4:  You MUST drink an additional two (2) or more 16 ounce containers of water over the next one (1) hour.   Continue clear liquids.  DAY OF PROCEDURE:   DATE: 12/30/2018   DAY: Wednesday If you take medications for your heart, blood pressure, or breathing, you may take these medications.    5 hours before your procedure at :  2:30 am Step 1:  Pour ONE (1) 6-ounce bottle of SUPREP liquid into the mixing container.  Step 2:  Add cool drinking water to the 16 ounce line on the container and mix.  Note: Dilute the solution concentrate as directed prior to use. Step 3:  DRINK ALL the liquid in the container. Step 4:  You  MUST drink an additional two (2) or more 16 ounce containers of water over the next one (1) hour. You MUST complete the final glass of water at least 3 hours before your colonoscopy. Nothing by mouth past 4:30 am  You may take your morning medications with sip of water unless we have instructed otherwise.    Please see below for Dietary Information.  CLEAR LIQUIDS INCLUDE:  Water Jello (NOT red in color)   Ice Popsicles (NOT red in color)   Tea (sugar ok, no milk/cream) Powdered fruit flavored drinks  Coffee (sugar ok, no milk/cream) Gatorade/ Lemonade/ Kool-Aid  (NOT red in color)   Juice: apple, white grape, white cranberry Soft  drinks  Clear bullion, consomme, broth (fat free beef/chicken/vegetable)  Carbonated beverages (any kind)  Strained chicken noodle soup Hard Candy   Remember: Clear liquids are liquids that will allow you to see your fingers on the other side of a clear glass. Be sure liquids are NOT red in color, and not cloudy, but CLEAR.  DO NOT EAT OR DRINK ANY OF THE FOLLOWING:  Dairy products of any kind   Cranberry juice Tomato juice / V8 juice   Grapefruit juice Orange juice     Red grape juice  Do not eat any solid foods, including such foods as: cereal, oatmeal, yogurt, fruits, vegetables, creamed soups, eggs, bread, crackers, pureed foods in a blender, etc.   HELPFUL HINTS FOR DRINKING PREP SOLUTION:   Make sure prep is extremely cold. Mix and refrigerate the the morning of the prep. You may also put in the freezer.   You may try mixing some Crystal Light or Country Time Lemonade if you prefer. Mix in small amounts; add more if necessary.  Try drinking through a straw  Rinse mouth with water or a mouthwash between glasses, to remove after-taste.  Try sipping on a cold beverage /ice/ popsicles between glasses of prep.  Place a piece of sugar-free hard candy in mouth between glasses.  If you become nauseated, try consuming smaller amounts, or stretch out  the time between glasses. Stop for 30-60 minutes, then slowly start back drinking.     OTHER INSTRUCTIONS  You will need a responsible adult at least 50 years of age to accompany you and drive you home. This person must remain in the waiting room during your procedure. The hospital will cancel your procedure if you do not have a responsible adult with you.   1. Wear loose fitting clothing that is easily removed. 2. Leave jewelry and other valuables at home.  3. Remove all body piercing jewelry and leave at home. 4. Total time from sign-in until discharge is approximately 2-3 hours. 5. You should go home directly after your procedure and rest. You can resume normal activities the day after your procedure. 6. The day of your procedure you should not:  Drive  Make legal decisions  Operate machinery  Drink alcohol  Return to work   You may call the office (Dept: [phone]) before 5:00pm, or page the doctor on call [phone]) after 5:00pm, for further instructions, if necessary.   Insurance Information YOU WILL NEED TO CHECK WITH YOUR INSURANCE COMPANY FOR THE BENEFITS OF COVERAGE YOU HAVE FOR THIS PROCEDURE.  UNFORTUNATELY, NOT ALL INSURANCE COMPANIES HAVE BENEFITS TO COVER ALL OR PART OF THESE TYPES OF PROCEDURES.  IT IS YOUR RESPONSIBILITY TO CHECK YOUR BENEFITS, HOWEVER, WE WILL BE GLAD TO ASSIST YOU WITH ANY CODES YOUR INSURANCE COMPANY MAY NEED.    PLEASE NOTE THAT MOST INSURANCE COMPANIES WILL NOT COVER A SCREENING COLONOSCOPY FOR PEOPLE UNDER THE AGE OF 50  IF YOU HAVE BCBS INSURANCE, YOU MAY HAVE BENEFITS FOR A SCREENING COLONOSCOPY BUT IF POLYPS ARE FOUND THE DIAGNOSIS WILL CHANGE AND THEN YOU MAY HAVE A DEDUCTIBLE THAT WILL NEED TO BE MET. SO PLEASE MAKE SURE YOU CHECK YOUR BENEFITS FOR A SCREENING COLONOSCOPY AS WELL AS A DIAGNOSTIC COLONOSCOPY.

## 2018-10-23 NOTE — Progress Notes (Signed)
Endo notified about n/v by orders under special needs.

## 2018-10-23 NOTE — Addendum Note (Signed)
Addended by: Metro Kung on: 10/23/2018 09:30 AM   Modules accepted: Orders, SmartSet

## 2018-10-26 MED FILL — OLMESARTAN MEDOXOMIL 40 MG: 40 | 30 days supply | Qty: 30 | Fill #2

## 2018-10-28 MED FILL — TAMOXIFEN CITRATE 20 MG TAB: 20 | 30 days supply | Qty: 30 | Fill #5

## 2018-11-02 MED FILL — HYDROCHLOROTHIAZIDE 25 MG T: 25 | 90 days supply | Qty: 90 | Fill #0

## 2018-11-03 ENCOUNTER — Encounter: Payer: Self-pay | Admitting: Genetic Counselor

## 2018-11-03 ENCOUNTER — Telehealth: Payer: Self-pay | Admitting: Genetic Counselor

## 2018-11-03 ENCOUNTER — Ambulatory Visit: Payer: Self-pay | Admitting: Genetic Counselor

## 2018-11-03 DIAGNOSIS — Z1379 Encounter for other screening for genetic and chromosomal anomalies: Secondary | ICD-10-CM

## 2018-11-03 NOTE — Telephone Encounter (Signed)
Revealed negative genetic testing.  Discussed that we do not know why she has breast cancer or why there is cancer in the family. It could be due to a different gene that we are not testing, or maybe our current technology may not be able to pick something up.  It will be important for her to keep in contact with genetics to keep up with whether additional testing may be needed. 

## 2018-11-03 NOTE — Progress Notes (Signed)
HPI:  Lorraine Peters was previously seen in the [place] clinic due to a personal and family history of breast cancer and concerns regarding a hereditary predisposition to cancer. Please refer to our prior cancer genetics clinic note for more information regarding our discussion, assessment and recommendations, at the time. Lorraine Peters recent genetic test results were disclosed to her, as were recommendations warranted by these results. These results and recommendations are discussed in more detail below.  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  The Common Hereditary Gene Panel offered by [facility] includes sequencing and/or deletion duplication testing of the following 48 genes: APC, ATM, AXIN2, BARD1, BMPR1A, BRCA1, BRCA2, BRIP1, CDH1, CDK4, CDKN2A (p14ARF), CDKN2A (p16INK4a), CHEK2, CTNNA1, DICER1, EPCAM (Deletion/duplication testing only), GREM1 (promoter region deletion/duplication testing only), KIT, MEN1, MLH1, MSH2, MSH3, MSH6, MUTYH, NBN, NF1, NHTL1, PALB2, PDGFRA, PMS2, POLD1, POLE, PTEN, RAD50, RAD51C, RAD51D, RNF43, SDHB, SDHC, SDHD, SMAD4, SMARCA4. STK11, TP53, TSC1, TSC2, and VHL.  The following genes were evaluated for sequence changes only: SDHA and HOXB13 c.251G>A variant only. The report date is October 30, 2018.     FAMILY HISTORY:  We obtained a detailed, 4-generation family history.  Significant diagnoses are listed below: Family History  Problem Relation Age of Onset   Arthritis Mother    Cancer Mother        breast   Breast cancer Mother 106       recurrance at 55   COPD Father    Heart disease Father    Hyperlipidemia Father    Hypertension Father    Cancer Sister 34       anal, deceased due to mva  but had advanced disease   Stroke Maternal Grandfather    Breast cancer Maternal Aunt 58   Breast cancer Maternal Uncle        colon     The patient has one daughter who is cancer free.  She hast two sisters, one who had anal cancer at 32 and died at 75 in a car accident.  Both parents are deceased.  The patient's father died at 53 from natural causes.  He had 11 brothers and one sister who are cancer free.  His parents also died of natural causes.  The patient's mother had breast cancer at 65 and 12 and diagnosed with lung cancer at 22.  She had two siblings, a brother and sister.  The sister had breast cancer in her 93's and the brother had colon cancer in his 71's.  The maternal grandparents were reportedly cancer free.  Lorraine Peters is aware of previous family history of genetic testing for hereditary cancer risks in her mother which were reportedly negative.. Patient's maternal ancestors are of Caucasian descent, and paternal ancestors are of Caucasian descent. There is no reported Ashkenazi Jewish ancestry. There is no known consanguinity.  GENETIC TEST RESULTS: Genetic testing reported out on October 30, 2018 through the common hereditary cancer panel found no pathogenic mutations. The Common Hereditary Gene Panel offered by [facility] includes sequencing and/or deletion duplication testing of the following 48 genes: APC, ATM, AXIN2, BARD1, BMPR1A, BRCA1, BRCA2, BRIP1, CDH1, CDK4, CDKN2A (p14ARF), CDKN2A (p16INK4a), CHEK2, CTNNA1, DICER1, EPCAM (Deletion/duplication testing only), GREM1 (promoter region deletion/duplication testing only), KIT, MEN1, MLH1,  MSH2, MSH3, MSH6, MUTYH, NBN, NF1, NHTL1, PALB2, PDGFRA, PMS2, POLD1, POLE, PTEN, RAD50, RAD51C, RAD51D, RNF43, SDHB, SDHC, SDHD, SMAD4, SMARCA4. STK11, TP53, TSC1, TSC2, and VHL.  The following genes were evaluated for sequence changes only: SDHA and HOXB13 c.251G>A variant only.. The test report has been scanned into EPIC and is located under  the Molecular Pathology section of the Results Review tab.  A portion of the result report is included below for reference.     We discussed with Lorraine Peters that because current genetic testing is not perfect, it is possible there may be a gene mutation in one of these genes that current testing cannot detect, but that chance is small.  We also discussed, that there could be another gene that has not yet been discovered, or that we have not yet tested, that is responsible for the cancer diagnoses in the family. It is also possible there is a hereditary cause for the cancer in the family that Lorraine Peters did not inherit and therefore was not identified in her testing.  Therefore, it is important to remain in touch with cancer genetics in the future so that we can continue to offer Lorraine Peters the most up to date genetic testing.   ADDITIONAL GENETIC TESTING: We discussed with Lorraine Peters that there are other genes that are associated with increased cancer risk that can be analyzed. Should Lorraine Peters wish to pursue additional genetic testing, we are happy to discuss and coordinate this testing, at any time.    CANCER SCREENING RECOMMENDATIONS: Lorraine Peters test result is considered negative (normal).  This means that we have not identified a hereditary cause for her personal and family history of breast cancer at this time. Most cancers happen by chance and this negative test suggests that her cancer may fall into this category.    While reassuring, this does not definitively rule out a hereditary predisposition to cancer. It is still possible that there could be genetic mutations that are undetectable by current technology. There could be genetic mutations in genes that have not been tested or identified to increase cancer risk.  Therefore, it is recommended she continue to follow the cancer management and screening guidelines provided by her primary healthcare provider.   An individual's cancer risk and medical  management are not determined by genetic test results alone. Overall cancer risk assessment incorporates additional factors, including personal medical history, family history, and any available genetic information that may result in a personalized plan for cancer prevention and surveillance  RECOMMENDATIONS FOR FAMILY MEMBERS:  Individuals in this family might be at some increased risk of developing cancer, over the general population risk, simply due to the family history of cancer.  We recommended women in this family have a yearly mammogram beginning at age 73, or 62 years younger than the earliest onset of cancer, an annual clinical breast exam, and perform monthly breast self-exams. Women in this family should also have a gynecological exam as recommended by their primary provider. All family members should have a colonoscopy by age 26.  FOLLOW-UP: Lastly, we discussed with [name] that cancer genetics is a rapidly advancing field and it is possible that new genetic tests will be appropriate for her and/or her family members in the future. We encouraged her to remain in contact with cancer genetics on an annual basis so we can update her personal and family histories and let her know of advances in cancer genetics that may benefit this family.  Our contact number was provided. Ms. Tiede questions were answered to her satisfaction, and she knows she is welcome to call us at anytime with additional questions or concerns.   Roma Kayser, [place], [facility] Licensed, Certified Genetic Counselor [email or web address]

## 2018-11-09 MED FILL — ATORVASTATIN 20 MG TABLET: 20 | 60 days supply | Qty: 60 | Fill #0

## 2018-11-13 ENCOUNTER — Inpatient Hospital Stay (HOSPITAL_COMMUNITY): Payer: 59 | Attending: Hematology

## 2018-11-13 ENCOUNTER — Other Ambulatory Visit: Payer: Self-pay

## 2018-11-13 DIAGNOSIS — Z79899 Other long term (current) drug therapy: Secondary | ICD-10-CM | POA: Diagnosis not present

## 2018-11-13 DIAGNOSIS — Z79810 Long term (current) use of selective estrogen receptor modulators (SERMs): Secondary | ICD-10-CM | POA: Insufficient documentation

## 2018-11-13 DIAGNOSIS — Z170 Estrogen receptor positive status [ER+]: Secondary | ICD-10-CM | POA: Insufficient documentation

## 2018-11-13 DIAGNOSIS — Z803 Family history of malignant neoplasm of breast: Secondary | ICD-10-CM | POA: Insufficient documentation

## 2018-11-13 DIAGNOSIS — Z87891 Personal history of nicotine dependence: Secondary | ICD-10-CM | POA: Insufficient documentation

## 2018-11-13 DIAGNOSIS — D0512 Intraductal carcinoma in situ of left breast: Secondary | ICD-10-CM | POA: Diagnosis not present

## 2018-11-13 LAB — CBC WITH DIFFERENTIAL/PLATELET
Abs Immature Granulocytes: 0.01 10*3/uL (ref 0.00–0.07)
Basophils Absolute: 0 10*3/uL (ref 0.0–0.1)
Basophils Relative: 0 %
Eosinophils Absolute: 0.1 10*3/uL (ref 0.0–0.5)
Eosinophils Relative: 1 %
HCT: 39.9 % (ref 36.0–46.0)
Hemoglobin: 12.7 g/dL (ref 12.0–15.0)
Immature Granulocytes: 0 %
Lymphocytes Relative: 28 %
Lymphs Abs: 1.5 10*3/uL (ref 0.7–4.0)
MCH: 27.9 pg (ref 26.0–34.0)
MCHC: 31.8 g/dL (ref 30.0–36.0)
MCV: 87.7 fL (ref 80.0–100.0)
Monocytes Absolute: 0.3 10*3/uL (ref 0.1–1.0)
Monocytes Relative: 6 %
Neutro Abs: 3.5 10*3/uL (ref 1.7–7.7)
Neutrophils Relative %: 65 %
Platelets: 248 10*3/uL (ref 150–400)
RBC: 4.55 MIL/uL (ref 3.87–5.11)
RDW: 14.2 % (ref 11.5–15.5)
WBC: 5.4 10*3/uL (ref 4.0–10.5)
nRBC: 0 % (ref 0.0–0.2)

## 2018-11-13 LAB — COMPREHENSIVE METABOLIC PANEL
ALT: 29 U/L (ref 0–44)
AST: 22 U/L (ref 15–41)
Albumin: 3.4 g/dL — ABNORMAL LOW (ref 3.5–5.0)
Alkaline Phosphatase: 50 U/L (ref 38–126)
Anion gap: 7 (ref 5–15)
BUN: 13 mg/dL (ref 6–20)
CO2: 27 mmol/L (ref 22–32)
Calcium: 8.9 mg/dL (ref 8.9–10.3)
Chloride: 104 mmol/L (ref 98–111)
Creatinine, Ser: 0.54 mg/dL (ref 0.44–1.00)
GFR calc Af Amer: >60 mL/min (ref >60)
GFR calc non Af Amer: >60 mL/min (ref >60)
Glucose, Bld: 116 mg/dL — ABNORMAL HIGH (ref 70–99)
Potassium: 4 mmol/L (ref 3.5–5.1)
Sodium: 138 mmol/L (ref 135–145)
Total Bilirubin: 0.2 mg/dL — ABNORMAL LOW (ref 0.3–1.2)
Total Protein: 6.3 g/dL — ABNORMAL LOW (ref 6.5–8.1)

## 2018-11-14 LAB — VITAMIN D 25 HYDROXY (VIT D DEFICIENCY, FRACTURES): Vit D, 25-Hydroxy: 40.5 ng/mL (ref 30.0–100.0)

## 2018-11-20 ENCOUNTER — Encounter (HOSPITAL_COMMUNITY): Payer: Self-pay | Admitting: Hematology

## 2018-11-20 ENCOUNTER — Inpatient Hospital Stay (HOSPITAL_COMMUNITY): Payer: 59 | Admitting: Hematology

## 2018-11-20 ENCOUNTER — Other Ambulatory Visit: Payer: Self-pay

## 2018-11-20 VITALS — BP 129/76 | HR 93 | Temp 97.5°F | Resp 18 | Wt 196.0 lb

## 2018-11-20 DIAGNOSIS — Z170 Estrogen receptor positive status [ER+]: Secondary | ICD-10-CM | POA: Diagnosis not present

## 2018-11-20 DIAGNOSIS — D0512 Intraductal carcinoma in situ of left breast: Secondary | ICD-10-CM

## 2018-11-20 DIAGNOSIS — Z87891 Personal history of nicotine dependence: Secondary | ICD-10-CM | POA: Diagnosis not present

## 2018-11-20 DIAGNOSIS — Z803 Family history of malignant neoplasm of breast: Secondary | ICD-10-CM | POA: Diagnosis not present

## 2018-11-20 DIAGNOSIS — Z79899 Other long term (current) drug therapy: Secondary | ICD-10-CM | POA: Diagnosis not present

## 2018-11-20 DIAGNOSIS — Z79810 Long term (current) use of selective estrogen receptor modulators (SERMs): Secondary | ICD-10-CM | POA: Diagnosis not present

## 2018-11-20 NOTE — Assessment & Plan Note (Signed)
1.  Left breast high-grade DCIS: - Screening mammogram on 03/31/2018 shows 7-8 mm group of calcifications in the upper outer left breast. - Biopsy on 04/13/2018 shows DCIS, high nuclear grade, ER 70%, PR 80% -She underwent lumpectomy on 04/29/2018.  This showed high-grade DCIS, 0.5 cm, margins negative.  0/1 lymph node positive.  pTis, PN0, s/p radiation therapy.  - Started on  antiestrogen therapy with tamoxifen for 5 years.  We discussed the side effects in detail including but not limited to rare chance of thrombosis and endometrial hyperplasia/cancer. - Continues on tamoxifen, tolerating well.  She does experience tolerable hot flashes secondary to therapy.  Labs today are acceptable.   -We will schedule her for diagnostic mammogram in January 2021. - She will return to clinic in 4 months.  2.  Family history: - Mother had breast and lung cancers.  Maternal aunt had breast cancer.  Maternal uncle had colon cancer.  Sister had anal cancer. -Genetic testing was negative.

## 2018-11-20 NOTE — Progress Notes (Signed)
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  The Common Hereditary Gene Panel offered by [facility] includes sequencing and/or deletion duplication testing of the following 48 genes: APC, ATM, AXIN2, BARD1, BMPR1A, BRCA1, BRCA2, BRIP1, CDH1, CDK4, CDKN2A (p14ARF), CDKN2A (p16INK4a), CHEK2, CTNNA1, DICER1, EPCAM (Deletion/duplication testing only), GREM1 (promoter region deletion/duplication testing only), KIT, MEN1, MLH1, MSH2, MSH3, MSH6, MUTYH, NBN, NF1, NHTL1, PALB2, PDGFRA, PMS2, POLD1, POLE, PTEN, RAD50, RAD51C, RAD51D, RNF43, SDHB, SDHC, SDHD, SMAD4, SMARCA4. STK11, TP53, TSC1, TSC2, and VHL.  The following genes were evaluated for sequence changes only: SDHA and HOXB13 c.251G>A variant only. The report date is October 30, 2018.        INTERVAL HISTORY:  [name] 48 y.o. female presents today for follow up. Reports overall doing well. Denies any significant fatigue.  She continues on Tamoxifin, tolerating well. Reports tolerable hot flashes. Her last menstrual cycle was in February. She states she is now being followed by a counsuler for anxiety secondary to diagnosis. She denies any changes to her breast. No new lumps, masses, nipple inversion or discharge. She states this is helping. She is here for repeat labs and office visit.   REVIEW OF SYSTEMS:  Review of Systems  Constitutional: Negative.   HENT:  Negative.   Eyes: Negative.   Respiratory: Negative.    Cardiovascular: Negative.   Gastrointestinal: Negative.   Endocrine: Negative.   Genitourinary: Negative.    Musculoskeletal: Negative.   Skin: Negative.   Neurological: Negative.   Hematological: Negative.   Psychiatric/Behavioral: The patient is nervous/anxious.      PAST MEDICAL/SURGICAL HISTORY:  Past Medical History:  Diagnosis Date  . Breast cancer ([place])   . Family history of breast cancer   . Family history of colon cancer   . History of kidney stones   . Hypercholesterolemia   . Migraine   . Ovarian cyst   . PONV (postoperative nausea and vomiting)   . Reflux    Past Surgical History:  Procedure Laterality Date  . ESOPHAGOGASTRODUODENOSCOPY N/A 09/12/2017   Procedure: ESOPHAGOGASTRODUODENOSCOPY (EGD);  Surgeon: Daneil Dolin, MD;  Location: [facility];  Service: Endoscopy;  Laterality: N/A;  2:30pm  . PARTIAL MASTECTOMY WITH NEEDLE LOCALIZATION AND AXILLARY SENTINEL LYMPH NODE BX Left 04/29/2018   Procedure: PARTIAL MASTECTOMY WITH NEEDLE LOCALIZATION AND AXILLARY SENTINEL LYMPH NODE BX;  Surgeon: Virl Cagey, MD;  Location: [facility];  Service: General;  Laterality: Left;  . TUBAL LIGATION       SOCIAL HISTORY:  Social History   Socioeconomic History  . Marital status: Divorced    Spouse name: Not on file  . Number of children: 1  . Years of education: Not on file  . Highest education level: Not on file  Occupational History  . Not on file  Social Needs  . Financial resource strain: Not hard at all  . Food insecurity    Worry: Never true    Inability: Never true  . Transportation  needs    Medical: No    Non-medical: No  Tobacco Use  . Smoking status: Former Smoker    Packs/day: 0.50    Years: 28.00    Pack years: 14.00    Types: Cigarettes    Quit date: 04/22/2018    Years since quitting: 0.5  . Smokeless tobacco: Never Used  Substance and Sexual Activity  . Alcohol use: No    Alcohol/week: 0.0 standard drinks  . Drug use: No  . Sexual  activity: Yes    Birth control/protection: Surgical  Lifestyle  . Physical activity    Days per week: 3 days    Minutes per session: 20 min  . Stress: Not at all  Relationships  . Social connections    Talks on phone: More than three times a week    Gets together: Three times a week    Attends religious service: More than 4 times per year    Active member of club or organization: No    Attends meetings of clubs or organizations: Never    Relationship status: Divorced  . Intimate partner violence    Fear of current or ex partner: No    Emotionally abused: No    Physically abused: No    Forced sexual activity: No  Other Topics Concern  . Not on file  Social History Narrative  . Not on file    FAMILY HISTORY:  Family History  Problem Relation Age of Onset  . Arthritis Mother   . Cancer Mother        breast  . Breast cancer Mother 1       recurrance at 73  . COPD Father   . Heart disease Father   . Hyperlipidemia Father   . Hypertension Father   . Cancer Sister 88       anal, deceased due to mva but had advanced disease  . Stroke Maternal Grandfather   . Breast cancer Maternal Aunt 58  . Breast cancer Maternal Uncle        colon    CURRENT MEDICATIONS:  Outpatient Encounter Medications as of 11/20/2018  Medication Sig  . atorvastatin (LIPITOR) 20 MG tablet TAKE 1 Tablet BY MOUTH ONCE DAILY (Patient taking differently: Take 10 mg by mouth at bedtime. )  . Cinnamon 500 MG capsule Take 500 mg by mouth daily.  . hydrochlorothiazide (HYDRODIURIL) 25 MG tablet Take 25 mg by mouth daily.  Marland Kitchen levocetirizine (XYZAL) 5 MG tablet Take 5 mg by mouth every evening.  . Multiple Vitamins-Minerals (VITAMIN D3 COMPLETE PO) Take by mouth daily. Takes 1200 mg  . olmesartan (BENICAR) 40 MG tablet Take 40 mg by mouth daily.  Marland Kitchen omeprazole (PRILOSEC) 40 MG capsule Take 40 mg by mouth daily.  . tamoxifen (NOLVADEX) 20 MG tablet Take 1 tablet (20 mg total) by mouth daily.  Marland Kitchen albuterol  (PROVENTIL HFA;VENTOLIN HFA) 108 (90 Base) MCG/ACT inhaler Inhale 2 puffs into the lungs every 6 (six) hours as needed for wheezing or shortness of breath. (Patient not taking: Reported on 11/20/2018)  . hydrOXYzine (VISTARIL) 25 MG capsule Take 25 mg by mouth as needed.  . Magnesium 250 MG TABS Take by mouth as needed.   No facility-administered encounter medications on file as of 11/20/2018.     ALLERGIES:  Allergies  Allergen Reactions  . Bee Venom Swelling and Other (See Comments)    Severe swelling     PHYSICAL EXAM:  ECOG Performance status: 1  Vitals:  11/20/18 0830  BP: 129/76  Pulse: 93  Resp: 18  Temp: (!) 97.5 F (36.4 C)  SpO2: 99%   Filed Weights   11/20/18 0830  Weight: 196 lb (88.9 kg)    Physical Exam Constitutional:      Appearance: Normal appearance. She is obese.  HENT:     Right Ear: External ear normal.     Left Ear: External ear normal.     Nose: Nose normal.     Mouth/Throat:     Pharynx: Oropharynx is clear.  Eyes:     Conjunctiva/sclera: Conjunctivae normal.  Neck:     Musculoskeletal: Normal range of motion.  Cardiovascular:     Rate and Rhythm: Normal rate and regular rhythm.     Pulses: Normal pulses.     Heart sounds: Normal heart sounds.  Pulmonary:     Effort: Pulmonary effort is normal.  Abdominal:     General: Bowel sounds are normal.  Musculoskeletal: Normal range of motion.  Skin:    General: Skin is warm.  Neurological:     General: No focal deficit present.     Mental Status: She is alert and oriented to person, place, and time.  Psychiatric:        Mood and Affect: Mood normal.        Behavior: Behavior normal.        Thought Content: Thought content normal.        Judgment: Judgment normal.      LABORATORY DATA:  I have reviewed the labs as listed.  CBC    Component Value Date/Time   WBC 5.4 11/13/2018 1211   RBC 4.55 11/13/2018 1211   HGB 12.7 11/13/2018 1211   HCT 39.9 11/13/2018 1211   PLT 248  11/13/2018 1211   MCV 87.7 11/13/2018 1211   MCH 27.9 11/13/2018 1211   MCHC 31.8 11/13/2018 1211   RDW 14.2 11/13/2018 1211   LYMPHSABS 1.5 11/13/2018 1211   MONOABS 0.3 11/13/2018 1211   EOSABS 0.1 11/13/2018 1211   BASOSABS 0.0 11/13/2018 1211   CMP Latest Ref Rng & Units 11/13/2018 10/08/2017 06/11/2017  Glucose 70 - 99 mg/dL 116(H) 120(H) 117(H)  BUN 6 - 20 mg/dL 13 12 17   Creatinine 0.44 - 1.00 mg/dL 0.54 0.57 0.59  Sodium 135 - 145 mmol/L 138 140 138  Potassium 3.5 - 5.1 mmol/L 4.0 4.6 4.1  Chloride 98 - 111 mmol/L 104 106 104  CO2 22 - 32 mmol/L 27 27 24   Calcium 8.9 - 10.3 mg/dL 8.9 9.0 9.0  Total Protein 6.5 - 8.1 g/dL 6.3(L) 6.9 6.6  Total Bilirubin 0.3 - 1.2 mg/dL 0.2(L) 0.3 0.5  Alkaline Phos 38 - 126 U/L 50 78 76  AST 15 - 41 U/L 22 13(L) 14(L)  ALT 0 - 44 U/L 29 19 19         ASSESSMENT & PLAN:   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  Left breast high-grade DCIS: - Screening mammogram on 03/31/2018 shows 7-8 mm group of calcifications in the upper outer left breast. - Biopsy on 04/13/2018 shows DCIS, high nuclear grade, ER 70%, PR 80% -She underwent lumpectomy on 04/29/2018.  This showed high-grade DCIS, 0.5 cm, margins negative.  0/1 lymph node positive.  pTis, PN0, s/p radiation therapy.  - Started on  antiestrogen therapy with tamoxifen for 5 years.  We discussed the side effects in detail including but not limited to rare chance of thrombosis and endometrial hyperplasia/cancer. -  Continues on tamoxifen, tolerating well.  She does experience tolerable hot flashes secondary to therapy.  Labs today are acceptable.   -We will schedule her for diagnostic mammogram in January 2021. - She will return to clinic in 4 months.  2.  Family history: - Mother had breast and lung cancers.  Maternal aunt had breast cancer.  Maternal uncle had colon cancer.  Sister had anal cancer. -Genetic testing was negative.      Orders placed this  encounter:  Orders Placed This Encounter  Procedures  . MM Digital Diagnostic Bilat  . CBC with Differential  . Comprehensive metabolic panel  . Vitamin D 25 hydroxy     Roger Shelter, [place] [phone]

## 2018-11-23 MED FILL — OLMESARTAN MEDOXOMIL 40 MG: 40 | 30 days supply | Qty: 30 | Fill #3

## 2018-11-25 DIAGNOSIS — J019 Acute sinusitis, unspecified: Secondary | ICD-10-CM | POA: Diagnosis not present

## 2018-11-25 DIAGNOSIS — Z2821 Immunization not carried out because of patient refusal: Secondary | ICD-10-CM | POA: Diagnosis not present

## 2018-11-27 ENCOUNTER — Other Ambulatory Visit (HOSPITAL_COMMUNITY): Payer: Self-pay | Admitting: Nurse Practitioner

## 2018-11-27 DIAGNOSIS — D0512 Intraductal carcinoma in situ of left breast: Secondary | ICD-10-CM

## 2018-11-27 MED FILL — TAMOXIFEN 20 MG TABLET: 20 | 30 days supply | Qty: 30 | Fill #0

## 2018-12-01 DIAGNOSIS — Z853 Personal history of malignant neoplasm of breast: Secondary | ICD-10-CM | POA: Diagnosis not present

## 2018-12-01 DIAGNOSIS — G629 Polyneuropathy, unspecified: Secondary | ICD-10-CM | POA: Diagnosis not present

## 2018-12-01 DIAGNOSIS — B373 Candidiasis of vulva and vagina: Secondary | ICD-10-CM | POA: Diagnosis not present

## 2018-12-01 MED FILL — GABAPENTIN 100 MG CAPSULE: 100 | 30 days supply | Qty: 90 | Fill #0

## 2018-12-21 MED FILL — TAMOXIFEN 20 MG TABLET: 20 | 30 days supply | Qty: 30 | Fill #1

## 2018-12-21 MED FILL — FEXOFENADINE HCL 180 MG TAB: 180 | 30 days supply | Qty: 30 | Fill #0

## 2018-12-21 MED FILL — OLMESARTAN MEDOXOMIL 40 MG: 40 | 30 days supply | Qty: 30 | Fill #4

## 2018-12-28 ENCOUNTER — Other Ambulatory Visit (HOSPITAL_COMMUNITY)
Admission: RE | Admit: 2018-12-28 | Discharge: 2018-12-28 | Disposition: A | Discharge status: Home / Self Care | Payer: 59 | Source: Ambulatory Visit | Attending: Internal Medicine | Admitting: Internal Medicine

## 2018-12-28 DIAGNOSIS — Z01812 Encounter for preprocedural laboratory examination: Secondary | ICD-10-CM | POA: Diagnosis not present

## 2018-12-28 DIAGNOSIS — Z20828 Contact with and (suspected) exposure to other viral communicable diseases: Secondary | ICD-10-CM | POA: Diagnosis not present

## 2018-12-28 LAB — SARS CORONAVIRUS 2 (TAT 6-24 HRS): SARS Coronavirus 2: NEGATIVE

## 2018-12-30 ENCOUNTER — Other Ambulatory Visit: Payer: Self-pay

## 2018-12-30 ENCOUNTER — Encounter (HOSPITAL_COMMUNITY): Payer: Self-pay | Admitting: *Deleted

## 2018-12-30 ENCOUNTER — Ambulatory Visit (HOSPITAL_COMMUNITY)
Admission: RE | Admit: 2018-12-30 | Discharge: 2018-12-30 | Disposition: A | Discharge status: Home / Self Care | Payer: 59 | Attending: Internal Medicine | Admitting: Internal Medicine

## 2018-12-30 ENCOUNTER — Encounter (HOSPITAL_COMMUNITY)
Admission: RE | Disposition: A | Discharge status: Home / Self Care | Payer: Self-pay | Source: Home / Self Care | Attending: Internal Medicine

## 2018-12-30 DIAGNOSIS — K621 Rectal polyp: Secondary | ICD-10-CM | POA: Diagnosis not present

## 2018-12-30 DIAGNOSIS — Z800 Family history of malignant neoplasm of digestive organs: Secondary | ICD-10-CM | POA: Insufficient documentation

## 2018-12-30 DIAGNOSIS — Z803 Family history of malignant neoplasm of breast: Secondary | ICD-10-CM | POA: Insufficient documentation

## 2018-12-30 DIAGNOSIS — K5730 Diverticulosis of large intestine without perforation or abscess without bleeding: Secondary | ICD-10-CM | POA: Diagnosis not present

## 2018-12-30 DIAGNOSIS — D122 Benign neoplasm of ascending colon: Secondary | ICD-10-CM | POA: Diagnosis not present

## 2018-12-30 DIAGNOSIS — E7800 Pure hypercholesterolemia, unspecified: Secondary | ICD-10-CM | POA: Insufficient documentation

## 2018-12-30 DIAGNOSIS — D124 Benign neoplasm of descending colon: Secondary | ICD-10-CM | POA: Diagnosis not present

## 2018-12-30 DIAGNOSIS — Z87891 Personal history of nicotine dependence: Secondary | ICD-10-CM | POA: Diagnosis not present

## 2018-12-30 DIAGNOSIS — Z79899 Other long term (current) drug therapy: Secondary | ICD-10-CM | POA: Diagnosis not present

## 2018-12-30 DIAGNOSIS — C50912 Malignant neoplasm of unspecified site of left female breast: Secondary | ICD-10-CM | POA: Diagnosis not present

## 2018-12-30 DIAGNOSIS — Z1211 Encounter for screening for malignant neoplasm of colon: Secondary | ICD-10-CM | POA: Diagnosis not present

## 2018-12-30 DIAGNOSIS — Z79810 Long term (current) use of selective estrogen receptor modulators (SERMs): Secondary | ICD-10-CM | POA: Diagnosis not present

## 2018-12-30 DIAGNOSIS — Z91030 Bee allergy status: Secondary | ICD-10-CM | POA: Diagnosis not present

## 2018-12-30 DIAGNOSIS — K635 Polyp of colon: Secondary | ICD-10-CM | POA: Diagnosis not present

## 2018-12-30 HISTORY — PX: COLONOSCOPY: SHX5424

## 2018-12-30 HISTORY — PX: POLYPECTOMY: SHX5525

## 2018-12-30 SURGERY — COLONOSCOPY
Anesthesia: Moderate Sedation

## 2018-12-30 MED ORDER — ONDANSETRON HCL 4 MG/2ML IJ SOLN
INTRAMUSCULAR | Status: AC
  Filled 2018-12-30: qty 2

## 2018-12-30 MED ORDER — STERILE WATER FOR IRRIGATION IR SOLN
Status: DC | PRN
  Administered 2018-12-30: 2.5 mL

## 2018-12-30 MED ORDER — MEPERIDINE HCL 50 MG/ML IJ SOLN
INTRAMUSCULAR | Status: AC
  Filled 2018-12-30: qty 1

## 2018-12-30 MED ORDER — MEPERIDINE HCL 100 MG/ML IJ SOLN
INTRAMUSCULAR | Status: DC | PRN
  Administered 2018-12-30 (×2): 25 mg via INTRAVENOUS

## 2018-12-30 MED ORDER — SODIUM CHLORIDE 0.9 % IV SOLN
INTRAVENOUS | Status: DC
  Administered 2018-12-30: 07:00:00 via INTRAVENOUS

## 2018-12-30 MED ORDER — MIDAZOLAM HCL 5 MG/5ML IJ SOLN
INTRAMUSCULAR | Status: AC
  Filled 2018-12-30: qty 10

## 2018-12-30 MED ORDER — MIDAZOLAM HCL 5 MG/5ML IJ SOLN
INTRAMUSCULAR | Status: AC
  Filled 2018-12-30: qty 5

## 2018-12-30 MED ORDER — MIDAZOLAM HCL 5 MG/5ML IJ SOLN
INTRAMUSCULAR | Status: DC | PRN
  Administered 2018-12-30 (×2): 2 mg via INTRAVENOUS
  Administered 2018-12-30 (×3): 1 mg via INTRAVENOUS
  Administered 2018-12-30: 2 mg via INTRAVENOUS
  Administered 2018-12-30 (×2): 1 mg via INTRAVENOUS

## 2018-12-30 NOTE — Discharge Instructions (Signed)
Colonoscopy Discharge Instructions  Read the instructions outlined below and refer to this sheet in the next few weeks. These discharge instructions provide you with general information on caring for yourself after you leave the hospital. Your doctor may also give you specific instructions. While your treatment has been planned according to the most current medical practices available, unavoidable complications occasionally occur. If you have any problems or questions after discharge, call Dr. Gala Romney at [phone]. ACTIVITY  You may resume your regular activity, but move at a slower pace for the next 24 hours.   Take frequent rest periods for the next 24 hours.   Walking will help get rid of the air and reduce the bloated feeling in your belly (abdomen).   No driving for 24 hours (because of the medicine (anesthesia) used during the test).    Do not sign any important legal documents or operate any machinery for 24 hours (because of the anesthesia used during the test).  NUTRITION  Drink plenty of fluids.   You may resume your normal diet as instructed by your doctor.   Begin with a light meal and progress to your normal diet. Heavy or fried foods are harder to digest and may make you feel sick to your stomach (nauseated).   Avoid alcoholic beverages for 24 hours or as instructed.  MEDICATIONS  You may resume your normal medications unless your doctor tells you otherwise.  WHAT YOU CAN EXPECT TODAY  Some feelings of bloating in the abdomen.   Passage of more gas than usual.   Spotting of blood in your stool or on the toilet paper.  IF YOU HAD POLYPS REMOVED DURING THE COLONOSCOPY:  No aspirin products for 7 days or as instructed.   No alcohol for 7 days or as instructed.   Eat a soft diet for the next 24 hours.  FINDING OUT THE RESULTS OF YOUR TEST Not all test results are available during your visit. If your test results are not back during the visit, make an appointment  with your caregiver to find out the results. Do not assume everything is normal if you have not heard from your caregiver or the medical facility. It is important for you to follow up on all of your test results.  SEEK IMMEDIATE MEDICAL ATTENTION IF:  You have more than a spotting of blood in your stool.   Your belly is swollen (abdominal distention).   You are nauseated or vomiting.   You have a temperature over 101.   You have abdominal pain or discomfort that is severe or gets worse throughout the day.   Diverticulosis and colon polyp information provided  Further recommendations to follow pending review of pathology report  At patient's request, I called Benay Pillow, sister at [phone] and reviewed results and recommendations     Diverticulosis  Diverticulosis is a condition that develops when small pouches (diverticula) form in the wall of the large intestine (colon). The colon is where water is absorbed and stool is formed. The pouches form when the inside layer of the colon pushes through weak spots in the outer layers of the colon. You may have a few pouches or many of them. What are the causes? The cause of this condition is not known. What increases the risk? The following factors may make you more likely to develop this condition:  Being older than age 91. Your risk for this condition increases with age. Diverticulosis is rare among people younger than age 70. By  age 32, many people have it.  Eating a low-fiber diet.  Having frequent constipation.  Being overweight.  Not getting enough exercise.  Smoking.  Taking over-the-counter pain medicines, like aspirin and ibuprofen.  Having a family history of diverticulosis. What are the signs or symptoms? In most people, there are no symptoms of this condition. If you do have symptoms, they may include:  Bloating.  Cramps in the abdomen.  Constipation or diarrhea.  Pain in the lower left side of the  abdomen. How is this diagnosed? This condition is most often diagnosed during an exam for other colon problems. Because diverticulosis usually has no symptoms, it often cannot be diagnosed independently. This condition may be diagnosed by:  Using a flexible scope to examine the colon (colonoscopy).  Taking an X-ray of the colon after dye has been put into the colon (barium enema).  Doing a CT scan. How is this treated? You may not need treatment for this condition if you have never developed an infection related to diverticulosis. If you have had an infection before, treatment may include:  Eating a high-fiber diet. This may include eating more fruits, vegetables, and grains.  Taking a fiber supplement.  Taking a live bacteria supplement (probiotic).  Taking medicine to relax your colon.  Taking antibiotic medicines. Follow these instructions at home:  Drink 6-8 glasses of water or more each day to prevent constipation.  Try not to strain when you have a bowel movement.  If you have had an infection before: ? Eat more fiber as directed by your health care provider or your diet and nutrition specialist (dietitian). ? Take a fiber supplement or probiotic, if your health care provider approves.  Take over-the-counter and prescription medicines only as told by your health care provider.  If you were prescribed an antibiotic, take it as told by your health care provider. Do not stop taking the antibiotic even if you start to feel better.  Keep all follow-up visits as told by your health care provider. This is important. Contact a health care provider if:  You have pain in your abdomen.  You have bloating.  You have cramps.  You have not had a bowel movement in 3 days. Get help right away if:  Your pain gets worse.  Your bloating becomes very bad.  You have a fever or chills, and your symptoms suddenly get worse.  You vomit.  You have bowel movements that are bloody  or black.  You have bleeding from your rectum. Summary  Diverticulosis is a condition that develops when small pouches (diverticula) form in the wall of the large intestine (colon).  You may have a few pouches or many of them.  This condition is most often diagnosed during an exam for other colon problems.  If you have had an infection related to diverticulosis, treatment may include increasing the fiber in your diet, taking supplements, or taking medicines. This information is not intended to replace advice given to you by your health care provider. Make sure you discuss any questions you have with your health care provider. Document Released: 11/23/2003 Document Revised: 02/07/2017 Document Reviewed: 01/15/2016 Elsevier Patient Education  [employer].    Colon Polyps  Polyps are tissue growths inside the body. Polyps can grow in many places, including the large intestine (colon). A polyp may be a round bump or a mushroom-shaped growth. You could have one polyp or several. Most colon polyps are noncancerous (benign). However, some colon polyps can become  cancerous over time. Finding and removing the polyps early can help prevent this. What are the causes? The exact cause of colon polyps is not known. What increases the risk? You are more likely to develop this condition if you:  Have a family history of colon cancer or colon polyps.  Are older than 96 or older than 45 if you are African American.  Have inflammatory bowel disease, such as ulcerative colitis or Crohn's disease.  Have certain hereditary conditions, such as: ? Familial adenomatous polyposis. ? Lynch syndrome. ? Turcot syndrome. ? Peutz-Jeghers syndrome.  Are overweight.  Smoke cigarettes.  Do not get enough exercise.  Drink too much alcohol.  Eat a diet that is high in fat and red meat and low in fiber.  Had childhood cancer that was treated with abdominal radiation. What are the signs or  symptoms? Most polyps do not cause symptoms. If you have symptoms, they may include:  Blood coming from your rectum when having a bowel movement.  Blood in your stool. The stool may look dark red or black.  Abdominal pain.  A change in bowel habits, such as constipation or diarrhea. How is this diagnosed? This condition is diagnosed with a colonoscopy. This is a procedure in which a lighted, flexible scope is inserted into the anus and then passed into the colon to examine the area. Polyps are sometimes found when a colonoscopy is done as part of routine cancer screening tests. How is this treated? Treatment for this condition involves removing any polyps that are found. Most polyps can be removed during a colonoscopy. Those polyps will then be tested for cancer. Additional treatment may be needed depending on the results of testing. Follow these instructions at home: Lifestyle  Maintain a healthy weight, or lose weight if recommended by your health care provider.  Exercise every day or as told by your health care provider.  Do not use any products that contain nicotine or tobacco, such as cigarettes and e-cigarettes. If you need help quitting, ask your health care provider.  If you drink alcohol, limit how much you have: ? 0-1 drink a day for women. ? 0-2 drinks a day for men.  Be aware of how much alcohol is in your drink. In the [place] one drink equals one 12 oz bottle of beer (355 mL), one 5 oz glass of wine (148 mL), or one 1 oz shot of hard liquor (44 mL). Eating and drinking   Eat foods that are high in fiber, such as fruits, vegetables, and whole grains.  Eat foods that are high in calcium and vitamin D, such as milk, cheese, yogurt, eggs, liver, fish, and broccoli.  Limit foods that are high in fat, such as fried foods and desserts.  Limit the amount of red meat and processed meat you eat, such as hot dogs, sausage, bacon, and lunch meats. General instructions  Keep  all follow-up visits as told by your health care provider. This is important. ? This includes having regularly scheduled colonoscopies. ? Talk to your health care provider about when you need a colonoscopy. Contact a health care provider if:  You have new or worsening bleeding during a bowel movement.  You have new or increased blood in your stool.  You have a change in bowel habits.  You lose weight for no known reason. Summary  Polyps are tissue growths inside the body. Polyps can grow in many places, including the colon.  Most colon polyps are noncancerous (benign), but  some can become cancerous over time.  This condition is diagnosed with a colonoscopy.  Treatment for this condition involves removing any polyps that are found. Most polyps can be removed during a colonoscopy. This information is not intended to replace advice given to you by your health care provider. Make sure you discuss any questions you have with your health care provider. Document Released: 11/22/2003 Document Revised: 06/12/2017 Document Reviewed: 06/12/2017 Elsevier Patient Education  2020 [employer].

## 2018-12-30 NOTE — H&P (Signed)
@LOGO @   Primary Care Physician:  Celene Squibb, MD Primary Gastroenterologist:  Dr. Gala Romney  Pre-Procedure History & Physical: HPI:  Lorraine Peters is a 48 y.o. female here for first ever screening colonoscopy.  Positive family history of breast cancer no GI symptoms.  No family history of any first-degree relatives with colon cancer.  Past Medical History:  Diagnosis Date  . Breast cancer ([place])   . Family history of breast cancer   . Family history of colon cancer   . History of kidney stones   . Hypercholesterolemia   . Migraine   . Ovarian cyst   . PONV (postoperative nausea and vomiting)   . Reflux     Past Surgical History:  Procedure Laterality Date  . ESOPHAGOGASTRODUODENOSCOPY N/A 09/12/2017   Procedure: ESOPHAGOGASTRODUODENOSCOPY (EGD);  Surgeon: Daneil Dolin, MD;  [facility];  Service: Endoscopy;  Laterality: N/A;  2:30pm  . PARTIAL MASTECTOMY WITH NEEDLE LOCALIZATION AND AXILLARY SENTINEL LYMPH NODE BX Left 04/29/2018   Procedure: PARTIAL MASTECTOMY WITH NEEDLE LOCALIZATION AND AXILLARY SENTINEL LYMPH NODE BX;  Surgeon: Virl Cagey, MD;  Location: [facility];  Service: General;  Laterality: Left;  . TUBAL LIGATION      Prior to Admission medications   Medication Sig Start Date End Date Taking? Authorizing Provider  atorvastatin (LIPITOR) 20 MG tablet TAKE 1 Tablet BY MOUTH ONCE DAILY Patient taking differently: Take 10 mg by mouth at bedtime.  04/07/17  Yes Soyla Dryer, PA-C  CALCIUM PO Take 2,400 mg by mouth daily.   Yes [provider]  Cinnamon 500 MG capsule Take 500 mg by mouth daily.   Yes [provider]  fexofenadine (ALLEGRA) 180 MG tablet Take 180 mg by mouth at bedtime.   Yes [provider]  gabapentin (NEURONTIN) 100 MG capsule Take 100 mg by mouth 3 (three) times daily.   Yes [provider]  hydrochlorothiazide (HYDRODIURIL) 25 MG tablet Take 25 mg by mouth daily.   Yes [provider]   hydrOXYzine (VISTARIL) 25 MG capsule Take 25 mg by mouth as needed for anxiety.    Yes [provider]  Magnesium 250 MG TABS Take 250 mg by mouth as needed (leg cramps).    Yes [provider]  olmesartan (BENICAR) 40 MG tablet Take 40 mg by mouth at bedtime.    Yes [provider]  omeprazole (PRILOSEC) 40 MG capsule Take 40 mg by mouth daily.   Yes [provider]  tamoxifen (NOLVADEX) 20 MG tablet TAKE 1 TABLET BY MOUTH DAILY. Patient taking differently: Take 20 mg by mouth daily.  11/27/18  Yes Lockamy, Randi L, NP-C    Allergies as of 10/23/2018 - Review Complete 10/22/2018  Allergen Reaction Noted  . Bee venom Swelling and Other (See Comments) 04/29/2011    Family History  Problem Relation Age of Onset  . Arthritis Mother   . Cancer Mother        breast  . Breast cancer Mother 20       recurrance at 55  . COPD Father   . Heart disease Father   . Hyperlipidemia Father   . Hypertension Father   . Cancer Sister 82       anal, deceased due to mva but had advanced disease  . Stroke Maternal Grandfather   . Breast cancer Maternal Aunt 58  . Breast cancer Maternal Uncle        colon    Social History  Socioeconomic History  . Marital status: Divorced    Spouse name: Not on file  . Number of children: 1  . Years of education: Not on file  . Highest education level: Not on file  Occupational History  . Not on file  Social Needs  . Financial resource strain: Not hard at all  . Food insecurity    Worry: Never true    Inability: Never true  . Transportation needs    Medical: No    Non-medical: No  Tobacco Use  . Smoking status: Former Smoker    Packs/day: 0.50    Years: 28.00    Pack years: 14.00    Types: Cigarettes    Quit date: 04/22/2018    Years since quitting: 0.6  . Smokeless tobacco: Never Used  Substance and Sexual Activity  . Alcohol use: No    Alcohol/week: 0.0 standard drinks  . Drug use: No  . Sexual activity:  Yes    Birth control/protection: Surgical  Lifestyle  . Physical activity    Days per week: 3 days    Minutes per session: 20 min  . Stress: Not at all  Relationships  . Social connections    Talks on phone: More than three times a week    Gets together: Three times a week    Attends religious service: More than 4 times per year    Active member of club or organization: No    Attends meetings of clubs or organizations: Never    Relationship status: Divorced  . Intimate partner violence    Fear of current or ex partner: No    Emotionally abused: No    Physically abused: No    Forced sexual activity: No  Other Topics Concern  . Not on file  Social History Narrative  . Not on file    Review of Systems: See HPI, otherwise negative ROS  Physical Exam: BP (!) 124/91   Pulse 73   Temp 97.8 F (36.6 C) (Oral)   Resp 20   Ht 5\' 8"  (1.727 m)   Wt 88.5 kg   SpO2 100%   BMI 29.65 kg/m  General:   Alert,  Well-developed, well-nourished, pleasant and cooperative in NAD Neck:  Supple; no masses or thyromegaly. No significant cervical adenopathy. Lungs:  Clear throughout to auscultation.   No wheezes, crackles, or rhonchi. No acute distress. Heart:  Regular rate and rhythm; no murmurs, clicks, rubs,  or gallops. Abdomen: Non-distended, normal bowel sounds.  Soft and nontender without appreciable mass or hepatosplenomegaly.  Pulses:  Normal pulses noted. Extremities:  Without clubbing or edema.  Impression/Plan: 48 year old female here for first ever screening colonoscopy.  No first-degree relatives with colon cancer.  No bowel symptoms.  No family history of colon cancer although sister had anal cancer.       The risks, benefits, limitations, alternatives and imponderables have been reviewed with the patient. Questions have been answered. All parties are agreeable.      Notice: This dictation was prepared with Dragon dictation along with smaller phrase technology. Any  transcriptional errors that result from this process are unintentional and may not be corrected upon review.

## 2018-12-30 NOTE — Op Note (Signed)
[facility] Patient Name: Lorraine Peters Procedure Date: 12/30/2018 7:07 AM MRN: [id number] Date of Birth: 1970/10/10 Attending MD: Norvel Richards , MD CSN: [id number] Age: 48 Admit Type: Outpatient Procedure:                Colonoscopy Indications:              Screening for colorectal malignant neoplasm Providers:                Norvel Richards, MD, Lurline Del, RN, Aram Candela Referring MD:              Medicines:                Midazolam 12 mg IV, Meperidine 50 mg IV Complications:            No immediate complications. Estimated Blood Loss:     Estimated blood loss was minimal. Procedure:                Pre-Anesthesia Assessment:                           - Prior to the procedure, a History and Physical                            was performed, and patient medications and                            allergies were reviewed. The patient's tolerance of                            previous anesthesia was also reviewed. The risks                            and benefits of the procedure and the sedation                            options and risks were discussed with the patient.                            All questions were answered, and informed consent                            was obtained. ASA Grade Assessment: II - A patient                            with mild systemic disease. After reviewing the                            risks and benefits, the patient was deemed in                            satisfactory condition to undergo the procedure.  After obtaining informed consent, the colonoscope                            was passed under direct vision. Throughout the                            procedure, the patient's blood pressure, pulse, and                            oxygen saturations were monitored continuously. The                            [id number] scope was introduced through            the anus and advanced to the the cecum, identified                            by appendiceal orifice and ileocecal valve. The                            colonoscopy was performed without difficulty. The                            patient tolerated the procedure well. The quality                            of the bowel preparation was adequate. Scope In: 7:58:38 AM Scope Out: 8:20:33 AM Scope Withdrawal Time: 0 hours 16 minutes 32 seconds  Total Procedure Duration: 0 hours 21 minutes 55 seconds  Findings:      The perianal and digital rectal examinations were normal.      Six semi-pedunculated polyps were found in the rectum, descending colon       and ascending colon. The polyps were 4 to 7 mm in size. These polyps       were removed with a cold snare. Resection and retrieval were complete.       Estimated blood loss was minimal.      Scattered medium-mouthed diverticula were found in the sigmoid colon and       descending colon.      The exam was otherwise without abnormality on direct and retroflexion       views. Impression:               - Six 4 to 7 mm polyps in the rectum, in the                            descending colon and in the ascending colon,                            removed with a cold snare. Resected and retrieved.                           - Diverticulosis in the sigmoid colon and in the  descending colon.                           - The examination was otherwise normal on direct                            and retroflexion views. Moderate Sedation:      Moderate (conscious) sedation was administered by the endoscopy nurse       and supervised by the endoscopist. The following parameters were       monitored: oxygen saturation, heart rate, blood pressure, respiratory       rate, EKG, adequacy of pulmonary ventilation, and response to care. Recommendation:           - Patient has a contact number available for                             emergencies. The signs and symptoms of potential                            delayed complications were discussed with the                            patient. Return to normal activities tomorrow.                            Written discharge instructions were provided to the                            patient.                           - Resume previous diet.                           - Continue present medications.                           - Await pathology results.                           - Repeat colonoscopy date to be determined after                            pending pathology results are reviewed for                            surveillance based on pathology results.                           - Return to GI office (date not yet determined). Procedure Code(s):        --- Professional ---                           [phone], Colonoscopy, flexible; with removal of  tumor(s), polyp(s), or other lesion(s) by snare                            technique Diagnosis Code(s):        --- Professional ---                           Z12.11, Encounter for screening for malignant                            neoplasm of colon                           K62.1, Rectal polyp                           K63.5, Polyp of colon                           K57.30, Diverticulosis of large intestine without                            perforation or abscess without bleeding CPT copyright 2019 American Medical Association. All rights reserved. The codes documented in this report are preliminary and upon coder review may  be revised to meet current compliance requirements. [name]. [name], MD Norvel Richards, MD 12/30/2018 8:36:43 AM This report has been signed electronically. Number of Addenda: 0

## 2018-12-31 ENCOUNTER — Encounter: Payer: Self-pay | Admitting: Internal Medicine

## 2018-12-31 LAB — SURGICAL PATHOLOGY

## 2019-01-05 ENCOUNTER — Encounter (HOSPITAL_COMMUNITY): Payer: Self-pay | Admitting: Internal Medicine

## 2019-01-07 ENCOUNTER — Telehealth: Payer: Self-pay

## 2019-01-07 DIAGNOSIS — L648 Other androgenic alopecia: Secondary | ICD-10-CM | POA: Diagnosis not present

## 2019-01-07 DIAGNOSIS — L918 Other hypertrophic disorders of the skin: Secondary | ICD-10-CM | POA: Diagnosis not present

## 2019-01-07 DIAGNOSIS — D225 Melanocytic nevi of trunk: Secondary | ICD-10-CM | POA: Diagnosis not present

## 2019-01-07 DIAGNOSIS — D2262 Melanocytic nevi of left upper limb, including shoulder: Secondary | ICD-10-CM | POA: Diagnosis not present

## 2019-01-07 DIAGNOSIS — D485 Neoplasm of uncertain behavior of skin: Secondary | ICD-10-CM | POA: Diagnosis not present

## 2019-01-07 NOTE — Telephone Encounter (Signed)
Received a VM, pt wanted to discuss her procedure results. Returned pt's call. Results gone over with pt. Pt understand results after explaining them.

## 2019-01-12 DIAGNOSIS — S134XXA Sprain of ligaments of cervical spine, initial encounter: Secondary | ICD-10-CM | POA: Diagnosis not present

## 2019-01-12 DIAGNOSIS — S338XXA Sprain of other parts of lumbar spine and pelvis, initial encounter: Secondary | ICD-10-CM | POA: Diagnosis not present

## 2019-01-12 DIAGNOSIS — S233XXA Sprain of ligaments of thoracic spine, initial encounter: Secondary | ICD-10-CM | POA: Diagnosis not present

## 2019-01-15 DIAGNOSIS — S233XXA Sprain of ligaments of thoracic spine, initial encounter: Secondary | ICD-10-CM | POA: Diagnosis not present

## 2019-01-15 DIAGNOSIS — S134XXA Sprain of ligaments of cervical spine, initial encounter: Secondary | ICD-10-CM | POA: Diagnosis not present

## 2019-01-15 DIAGNOSIS — S338XXA Sprain of other parts of lumbar spine and pelvis, initial encounter: Secondary | ICD-10-CM | POA: Diagnosis not present

## 2019-01-19 MED FILL — OLMESARTAN MEDOXOMIL 40 MG: 40 | 30 days supply | Qty: 30 | Fill #0

## 2019-01-20 DIAGNOSIS — S233XXA Sprain of ligaments of thoracic spine, initial encounter: Secondary | ICD-10-CM | POA: Diagnosis not present

## 2019-01-20 DIAGNOSIS — S134XXA Sprain of ligaments of cervical spine, initial encounter: Secondary | ICD-10-CM | POA: Diagnosis not present

## 2019-01-20 DIAGNOSIS — S338XXA Sprain of other parts of lumbar spine and pelvis, initial encounter: Secondary | ICD-10-CM | POA: Diagnosis not present

## 2019-01-20 MED FILL — TAMOXIFEN 20 MG TABLET: 20 | 30 days supply | Qty: 30 | Fill #2

## 2019-01-27 DIAGNOSIS — N760 Acute vaginitis: Secondary | ICD-10-CM | POA: Diagnosis not present

## 2019-01-27 DIAGNOSIS — Z113 Encounter for screening for infections with a predominantly sexual mode of transmission: Secondary | ICD-10-CM | POA: Diagnosis not present

## 2019-01-27 DIAGNOSIS — R350 Frequency of micturition: Secondary | ICD-10-CM | POA: Diagnosis not present

## 2019-02-01 MED FILL — HYDROCHLOROTHIAZIDE 25 MG T: 25 | 90 days supply | Qty: 90 | Fill #0

## 2019-02-01 MED FILL — ATORVASTATIN 20 MG TABLET: 20 | 60 days supply | Qty: 60 | Fill #0

## 2019-02-01 MED FILL — GABAPENTIN 100 MG CAPSULE: 100 | 30 days supply | Qty: 90 | Fill #0

## 2019-02-01 MED FILL — OMEPRAZOLE DR 40 MG CAPSULE: 40 | 90 days supply | Qty: 90 | Fill #0

## 2019-02-08 DIAGNOSIS — S134XXA Sprain of ligaments of cervical spine, initial encounter: Secondary | ICD-10-CM | POA: Diagnosis not present

## 2019-02-08 DIAGNOSIS — S233XXA Sprain of ligaments of thoracic spine, initial encounter: Secondary | ICD-10-CM | POA: Diagnosis not present

## 2019-02-08 DIAGNOSIS — S338XXA Sprain of other parts of lumbar spine and pelvis, initial encounter: Secondary | ICD-10-CM | POA: Diagnosis not present

## 2019-02-09 ENCOUNTER — Encounter (HOSPITAL_COMMUNITY): Payer: Self-pay | Admitting: Internal Medicine

## 2019-02-15 MED FILL — OLMESARTAN MEDOXOMIL 40 MG: 40 | 30 days supply | Qty: 30 | Fill #0

## 2019-02-16 DIAGNOSIS — Z760 Encounter for issue of repeat prescription: Secondary | ICD-10-CM | POA: Diagnosis not present

## 2019-03-02 MED FILL — TAMOXIFEN 20 MG TABLET: 20 | 30 days supply | Qty: 30 | Fill #0

## 2019-03-03 DIAGNOSIS — S76219A Strain of adductor muscle, fascia and tendon of unspecified thigh, initial encounter: Secondary | ICD-10-CM | POA: Diagnosis not present

## 2019-03-09 MED FILL — GABAPENTIN 100 MG CAPSULE: 100 | 30 days supply | Qty: 90 | Fill #0

## 2019-03-11 MED FILL — OLMESARTAN MEDOXOMIL 40 MG: 40 | 30 days supply | Qty: 30 | Fill #0

## 2019-03-18 DIAGNOSIS — H6990 Unspecified Eustachian tube disorder, unspecified ear: Secondary | ICD-10-CM | POA: Diagnosis not present

## 2019-03-18 DIAGNOSIS — R0982 Postnasal drip: Secondary | ICD-10-CM | POA: Diagnosis not present

## 2019-03-18 DIAGNOSIS — Z0000 Encounter for general adult medical examination without abnormal findings: Secondary | ICD-10-CM | POA: Diagnosis not present

## 2019-03-18 DIAGNOSIS — F411 Generalized anxiety disorder: Secondary | ICD-10-CM | POA: Diagnosis not present

## 2019-03-18 DIAGNOSIS — R252 Cramp and spasm: Secondary | ICD-10-CM | POA: Diagnosis not present

## 2019-03-18 DIAGNOSIS — R7301 Impaired fasting glucose: Secondary | ICD-10-CM | POA: Diagnosis not present

## 2019-03-18 DIAGNOSIS — Z712 Person consulting for explanation of examination or test findings: Secondary | ICD-10-CM | POA: Diagnosis not present

## 2019-03-18 DIAGNOSIS — Z0001 Encounter for general adult medical examination with abnormal findings: Secondary | ICD-10-CM | POA: Diagnosis not present

## 2019-03-18 DIAGNOSIS — I10 Essential (primary) hypertension: Secondary | ICD-10-CM | POA: Diagnosis not present

## 2019-03-23 ENCOUNTER — Encounter (HOSPITAL_COMMUNITY): Payer: Self-pay | Admitting: Hematology

## 2019-03-23 DIAGNOSIS — I10 Essential (primary) hypertension: Secondary | ICD-10-CM | POA: Diagnosis not present

## 2019-03-23 DIAGNOSIS — D0512 Intraductal carcinoma in situ of left breast: Secondary | ICD-10-CM | POA: Diagnosis not present

## 2019-03-23 DIAGNOSIS — Z853 Personal history of malignant neoplasm of breast: Secondary | ICD-10-CM | POA: Diagnosis not present

## 2019-03-23 DIAGNOSIS — E782 Mixed hyperlipidemia: Secondary | ICD-10-CM | POA: Diagnosis not present

## 2019-03-23 DIAGNOSIS — F411 Generalized anxiety disorder: Secondary | ICD-10-CM | POA: Diagnosis not present

## 2019-03-23 DIAGNOSIS — R7301 Impaired fasting glucose: Secondary | ICD-10-CM | POA: Diagnosis not present

## 2019-03-23 DIAGNOSIS — Z6829 Body mass index (BMI) 29.0-29.9, adult: Secondary | ICD-10-CM | POA: Diagnosis not present

## 2019-03-23 DIAGNOSIS — E669 Obesity, unspecified: Secondary | ICD-10-CM | POA: Diagnosis not present

## 2019-03-23 MED FILL — LEVOCETIRIZINE 5 MG TABLET: 5 | 30 days supply | Qty: 30 | Fill #0

## 2019-03-24 ENCOUNTER — Other Ambulatory Visit (HOSPITAL_COMMUNITY): Payer: Self-pay | Admitting: Hematology

## 2019-03-24 DIAGNOSIS — Z853 Personal history of malignant neoplasm of breast: Secondary | ICD-10-CM

## 2019-03-29 DIAGNOSIS — J343 Hypertrophy of nasal turbinates: Secondary | ICD-10-CM | POA: Diagnosis not present

## 2019-03-29 DIAGNOSIS — H8111 Benign paroxysmal vertigo, right ear: Secondary | ICD-10-CM | POA: Diagnosis not present

## 2019-03-29 DIAGNOSIS — R42 Dizziness and giddiness: Secondary | ICD-10-CM | POA: Diagnosis not present

## 2019-03-29 DIAGNOSIS — J310 Chronic rhinitis: Secondary | ICD-10-CM | POA: Diagnosis not present

## 2019-03-29 DIAGNOSIS — R0982 Postnasal drip: Secondary | ICD-10-CM | POA: Diagnosis not present

## 2019-03-30 MED FILL — TAMOXIFEN 20 MG TABLET: 20 | 30 days supply | Qty: 30 | Fill #1

## 2019-03-30 MED FILL — IPRATROPIUM 0.06% SPRAY: 0.06 | 20 days supply | Qty: 15 | Fill #0

## 2019-03-30 MED FILL — FLUTICASONE PROP 50 MCG SPR: 50 | 30 days supply | Qty: 16 | Fill #0

## 2019-04-05 MED FILL — GABAPENTIN 100 MG CAPSULE: 100 | 30 days supply | Qty: 90 | Fill #0

## 2019-04-06 ENCOUNTER — Encounter (HOSPITAL_COMMUNITY): Payer: Self-pay

## 2019-04-06 ENCOUNTER — Other Ambulatory Visit: Payer: Self-pay | Admitting: Hematology

## 2019-04-06 ENCOUNTER — Ambulatory Visit (HOSPITAL_COMMUNITY): Admission: RE | Admit: 2019-04-06 | Payer: 59 | Source: Ambulatory Visit

## 2019-04-06 ENCOUNTER — Encounter (HOSPITAL_COMMUNITY): Payer: Self-pay | Admitting: *Deleted

## 2019-04-06 ENCOUNTER — Ambulatory Visit (HOSPITAL_COMMUNITY)
Admission: RE | Admit: 2019-04-06 | Discharge: 2019-04-06 | Disposition: A | Discharge status: Home / Self Care | Payer: 59 | Source: Ambulatory Visit | Attending: Hematology | Admitting: Hematology

## 2019-04-06 ENCOUNTER — Encounter (HOSPITAL_COMMUNITY): Payer: 59

## 2019-04-06 ENCOUNTER — Inpatient Hospital Stay (HOSPITAL_COMMUNITY): Payer: 59

## 2019-04-06 ENCOUNTER — Other Ambulatory Visit: Payer: Self-pay

## 2019-04-06 DIAGNOSIS — D0512 Intraductal carcinoma in situ of left breast: Secondary | ICD-10-CM | POA: Insufficient documentation

## 2019-04-06 DIAGNOSIS — R921 Mammographic calcification found on diagnostic imaging of breast: Secondary | ICD-10-CM

## 2019-04-06 HISTORY — DX: Personal history of irradiation: Z92.3

## 2019-04-06 LAB — CBC WITH DIFFERENTIAL/PLATELET
Abs Immature Granulocytes: 0.02 10*3/uL (ref 0.00–0.07)
Basophils Absolute: 0 10*3/uL (ref 0.0–0.1)
Basophils Relative: 0 %
Eosinophils Absolute: 0.1 10*3/uL (ref 0.0–0.5)
Eosinophils Relative: 1 %
HCT: 36.4 % (ref 36.0–46.0)
Hemoglobin: 12.1 g/dL (ref 12.0–15.0)
Immature Granulocytes: 0 %
Lymphocytes Relative: 31 %
Lymphs Abs: 1.6 10*3/uL (ref 0.7–4.0)
MCH: 28.8 pg (ref 26.0–34.0)
MCHC: 33.2 g/dL (ref 30.0–36.0)
MCV: 86.7 fL (ref 80.0–100.0)
Monocytes Absolute: 0.3 10*3/uL (ref 0.1–1.0)
Monocytes Relative: 7 %
Neutro Abs: 3.1 10*3/uL (ref 1.7–7.7)
Neutrophils Relative %: 61 %
Platelets: 254 10*3/uL (ref 150–400)
RBC: 4.2 MIL/uL (ref 3.87–5.11)
RDW: 13.5 % (ref 11.5–15.5)
WBC: 5 10*3/uL (ref 4.0–10.5)
nRBC: 0 % (ref 0.0–0.2)

## 2019-04-06 LAB — VITAMIN D 25 HYDROXY (VIT D DEFICIENCY, FRACTURES): Vit D, 25-Hydroxy: 36.53 ng/mL (ref 30–100)

## 2019-04-06 LAB — COMPREHENSIVE METABOLIC PANEL
ALT: 19 U/L (ref 0–44)
AST: 15 U/L (ref 15–41)
Albumin: 3.4 g/dL — ABNORMAL LOW (ref 3.5–5.0)
Alkaline Phosphatase: 44 U/L (ref 38–126)
Anion gap: 10 (ref 5–15)
BUN: 13 mg/dL (ref 6–20)
CO2: 27 mmol/L (ref 22–32)
Calcium: 8.8 mg/dL — ABNORMAL LOW (ref 8.9–10.3)
Chloride: 102 mmol/L (ref 98–111)
Creatinine, Ser: 0.51 mg/dL (ref 0.44–1.00)
GFR calc Af Amer: >60 mL/min (ref >60)
GFR calc non Af Amer: >60 mL/min (ref >60)
Glucose, Bld: 107 mg/dL — ABNORMAL HIGH (ref 70–99)
Potassium: 3.2 mmol/L — ABNORMAL LOW (ref 3.5–5.1)
Sodium: 139 mmol/L (ref 135–145)
Total Bilirubin: 0.3 mg/dL (ref 0.3–1.2)
Total Protein: 6.1 g/dL — ABNORMAL LOW (ref 6.5–8.1)

## 2019-04-06 NOTE — Progress Notes (Signed)
I received notification from mammography that patient needs stereotactic breast biopsy.  I called and got it scheduled for 2/5 @ 10:30.  Patient is aware.

## 2019-04-08 ENCOUNTER — Ambulatory Visit (HOSPITAL_COMMUNITY): Payer: 59 | Admitting: Nurse Practitioner

## 2019-04-12 HISTORY — PX: BREAST BIOPSY: SHX20

## 2019-04-15 ENCOUNTER — Ambulatory Visit (HOSPITAL_COMMUNITY): Payer: 59 | Admitting: Nurse Practitioner

## 2019-04-16 ENCOUNTER — Ambulatory Visit
Admission: RE | Admit: 2019-04-16 | Discharge: 2019-04-16 | Disposition: A | Discharge status: Home / Self Care | Payer: 59 | Source: Ambulatory Visit | Attending: Hematology | Admitting: Hematology

## 2019-04-16 ENCOUNTER — Other Ambulatory Visit: Payer: Self-pay

## 2019-04-16 DIAGNOSIS — R921 Mammographic calcification found on diagnostic imaging of breast: Secondary | ICD-10-CM

## 2019-04-16 DIAGNOSIS — N6489 Other specified disorders of breast: Secondary | ICD-10-CM | POA: Diagnosis not present

## 2019-04-19 MED FILL — HYDROCHLOROTHIAZIDE 25 MG T: 25 | 90 days supply | Qty: 90 | Fill #1

## 2019-04-19 MED FILL — OLMESARTAN MEDOXOMIL 40 MG: 40 | 30 days supply | Qty: 30 | Fill #0

## 2019-04-19 MED FILL — ATORVASTATIN 20 MG TABLET: 20 | 60 days supply | Qty: 60 | Fill #0

## 2019-04-19 MED FILL — OMEPRAZOLE DR 40 MG CAPSULE: 40 | 90 days supply | Qty: 90 | Fill #0

## 2019-04-19 MED FILL — LEVOCETIRIZINE 5 MG TABLET: 5 | 30 days supply | Qty: 30 | Fill #1

## 2019-04-21 DIAGNOSIS — Z170 Estrogen receptor positive status [ER+]: Secondary | ICD-10-CM | POA: Diagnosis not present

## 2019-04-21 DIAGNOSIS — Z79810 Long term (current) use of selective estrogen receptor modulators (SERMs): Secondary | ICD-10-CM | POA: Diagnosis not present

## 2019-04-21 DIAGNOSIS — D0512 Intraductal carcinoma in situ of left breast: Secondary | ICD-10-CM | POA: Diagnosis not present

## 2019-04-22 ENCOUNTER — Inpatient Hospital Stay (HOSPITAL_COMMUNITY): Payer: 59 | Attending: Hematology | Admitting: Hematology

## 2019-04-22 ENCOUNTER — Encounter (HOSPITAL_COMMUNITY): Payer: Self-pay | Admitting: Hematology

## 2019-04-22 ENCOUNTER — Other Ambulatory Visit: Payer: Self-pay

## 2019-04-22 VITALS — BP 125/73 | HR 84 | Temp 97.5°F | Resp 14 | Wt 185.9 lb

## 2019-04-22 DIAGNOSIS — Z803 Family history of malignant neoplasm of breast: Secondary | ICD-10-CM | POA: Insufficient documentation

## 2019-04-22 DIAGNOSIS — Z801 Family history of malignant neoplasm of trachea, bronchus and lung: Secondary | ICD-10-CM | POA: Insufficient documentation

## 2019-04-22 DIAGNOSIS — Z79899 Other long term (current) drug therapy: Secondary | ICD-10-CM | POA: Diagnosis not present

## 2019-04-22 DIAGNOSIS — E8809 Other disorders of plasma-protein metabolism, not elsewhere classified: Secondary | ICD-10-CM | POA: Diagnosis not present

## 2019-04-22 DIAGNOSIS — M7989 Other specified soft tissue disorders: Secondary | ICD-10-CM | POA: Diagnosis not present

## 2019-04-22 DIAGNOSIS — D0512 Intraductal carcinoma in situ of left breast: Secondary | ICD-10-CM | POA: Diagnosis not present

## 2019-04-22 DIAGNOSIS — R232 Flushing: Secondary | ICD-10-CM | POA: Diagnosis not present

## 2019-04-22 DIAGNOSIS — Z170 Estrogen receptor positive status [ER+]: Secondary | ICD-10-CM | POA: Diagnosis not present

## 2019-04-22 DIAGNOSIS — Z87891 Personal history of nicotine dependence: Secondary | ICD-10-CM | POA: Diagnosis not present

## 2019-04-22 NOTE — Patient Instructions (Addendum)
[facility] at [facility] Discharge Instructions  You were seen today by Dr. Katragadda. He went over your recent lab results. He will see you back in 6 months for labs and follow up.   Thank you for choosing [facility] at [facility] to provide your oncology and hematology care.  To afford each patient quality time with our provider, please arrive at least 15 minutes before your scheduled appointment time.   If you have a lab appointment with the Cancer Center please come in thru the  Main Entrance and check in at the main information desk  You need to re-schedule your appointment should you arrive 10 or more minutes late.  We strive to give you quality time with our providers, and arriving late affects you and other patients whose appointments are after yours.  Also, if you no show three or more times for appointments you may be dismissed from the clinic at the providers discretion.     Again, thank you for choosing [facility].  Our hope is that these requests will decrease the amount of time that you wait before being seen by our physicians.       _____________________________________________________________  Should you have questions after your visit to [facility], please contact our office at [phone] between the hours of 8:00 a.m. and 4:30 p.m.  Voicemails left after 4:00 p.m. will not be returned until the following business day.  For prescription refill requests, have your pharmacy contact our office and allow 72 hours.    Cancer Center Support Programs:   > Cancer Support Group  2nd Tuesday of the month 1pm-2pm, Journey Room    

## 2019-04-22 NOTE — Progress Notes (Signed)
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  The Common Hereditary Gene Panel offered by [facility] includes sequencing and/or deletion duplication testing of the following 48 genes: APC, ATM, AXIN2, BARD1, BMPR1A, BRCA1, BRCA2, BRIP1, CDH1, CDK4, CDKN2A (p14ARF), CDKN2A (p16INK4a), CHEK2, CTNNA1, DICER1, EPCAM (Deletion/duplication testing only), GREM1 (promoter region deletion/duplication testing only), KIT, MEN1, MLH1, MSH2, MSH3, MSH6, MUTYH, NBN, NF1, NHTL1, PALB2, PDGFRA, PMS2, POLD1, POLE, PTEN, RAD50, RAD51C, RAD51D, RNF43, SDHB, SDHC, SDHD, SMAD4, SMARCA4. STK11, TP53, TSC1, TSC2, and VHL.  The following genes were evaluated for sequence changes only: SDHA and HOXB13 c.251G>A variant only. The report date is October 30, 2018.      CANCER STAGING: Cancer Staging No matching staging information was found for the patient.   INTERVAL HISTORY:  Lorraine Peters 49 y.o. female seen for follow-up of left breast DCIS.  Has occasional hot flashes.  Reports that she has been taking tamoxifen without fail.  She had an abnormal mammogram in January.  This was followed by biopsy last week.  She reports that she started having.'s about 3 months ago.  They have been very regular.  Ankle swellings have been stable.  Appetite and energy levels are 100%.    REVIEW OF SYSTEMS:  Review of Systems  Cardiovascular: Positive for leg swelling.  All other systems reviewed and  are negative.    PAST MEDICAL/SURGICAL HISTORY:  Past Medical History:  Diagnosis Date  . Breast cancer ([place])   . Family history of breast cancer   . Family history of colon cancer   . History of kidney stones   . Hypercholesterolemia   . Migraine   . Ovarian cyst   . Personal history of radiation therapy   . PONV (postoperative nausea and vomiting)   . Reflux    Past Surgical History:  Procedure Laterality Date  . BREAST LUMPECTOMY Left 2020  . COLONOSCOPY N/A 12/30/2018   Procedure: COLONOSCOPY;  Surgeon: Daneil Dolin, MD;  [facility];  Service: Endoscopy;  Laterality: N/A;  7:30  . ESOPHAGOGASTRODUODENOSCOPY N/A 09/12/2017   Procedure: ESOPHAGOGASTRODUODENOSCOPY (EGD);  Surgeon: Daneil Dolin, MD;  [facility];  Service: Endoscopy;  Laterality: N/A;  2:30pm  . PARTIAL MASTECTOMY WITH NEEDLE LOCALIZATION AND AXILLARY SENTINEL LYMPH NODE BX Left 04/29/2018   Procedure: PARTIAL MASTECTOMY WITH NEEDLE LOCALIZATION AND AXILLARY SENTINEL LYMPH NODE BX;  Surgeon: Virl Cagey, MD;  Location: [facility];  Service: General;  Laterality: Left;  . POLYPECTOMY  12/30/2018   Procedure: POLYPECTOMY;  Surgeon: Daneil Dolin, MD;  [facility];  Service: Endoscopy;;  ascending,descending,rectal  . TUBAL LIGATION       SOCIAL HISTORY:  Social History   Socioeconomic History  . Marital status: Divorced    Spouse name: Not on file  . Number of children: 1  . Years of education: Not on file  . Highest education level: Not on file  Occupational History  . Not on  file  Tobacco Use  . Smoking status: Former Smoker    Packs/day: 0.50    Years: 28.00    Pack years: 14.00    Types: Cigarettes    Quit date: 04/22/2018    Years since quitting: 1.0  . Smokeless tobacco: Never Used  Substance and Sexual Activity  . Alcohol use: No    Alcohol/week: 0.0 standard drinks  . Drug use: No  . Sexual activity: Yes    Birth control/protection: Surgical    Other Topics Concern  . Not on file  Social History Narrative  . Not on file   Social Determinants of Health   Financial Resource Strain: Low Risk   . Difficulty of Paying Living Expenses: Not hard at all  Food Insecurity: No Food Insecurity  . Worried About Charity fundraiser in the Last Year: Never true  . Ran Out of Food in the Last Year: Never true  Transportation Needs: No Transportation Needs  . Lack of Transportation (Medical): No  . Lack of Transportation (Non-Medical): No  Physical Activity: Insufficiently Active  . Days of Exercise per Week: 3 days  . Minutes of Exercise per Session: 20 min  Stress: No Stress Concern Present  . Feeling of Stress : Not at all  Social Connections: Somewhat Isolated  . Frequency of Communication with Friends and Family: More than three times a week  . Frequency of Social Gatherings with Friends and Family: Three times a week  . Attends Religious Services: More than 4 times per year  . Active Member of Clubs or Organizations: No  . Attends Archivist Meetings: Never  . Marital Status: Divorced  Human resources officer Violence: Not At Risk  . Fear of Current or Ex-Partner: No  . Emotionally Abused: No  . Physically Abused: No  . Sexually Abused: No    FAMILY HISTORY:  Family History  Problem Relation Age of Onset  . Arthritis Mother   . Cancer Mother        breast  . Breast cancer Mother 88       recurrance at 65  . COPD Father   . Heart disease Father   . Hyperlipidemia Father   . Hypertension Father   . Cancer Sister 48       anal, deceased due to mva but had advanced disease  . Stroke Maternal Grandfather   . Breast cancer Maternal Aunt 58  . Breast cancer Maternal Uncle        colon    CURRENT MEDICATIONS:  Outpatient Encounter Medications as of 04/22/2019  Medication Sig  . methocarbamol (ROBAXIN) 750 MG tablet TAKE 1 TABLET BY MOUTH UP TO THREE TIMES DAILY AS NEEDED FOR MUSCLE SPASM  . nystatin-triamcinolone  (MYCOLOG II) cream Apply 1 application topically as needed.   Marland Kitchen atorvastatin (LIPITOR) 20 MG tablet TAKE 1 Tablet BY MOUTH ONCE DAILY (Patient taking differently: Take 10 mg by mouth at bedtime. )  . CALCIUM PO Take 2,400 mg by mouth daily.  . Cinnamon 500 MG capsule Take 500 mg by mouth daily.  . fexofenadine (ALLEGRA) 180 MG tablet Take 180 mg by mouth at bedtime.  . fluticasone (FLONASE) 50 MCG/ACT nasal spray Place 1 spray into both nostrils as needed.   . gabapentin (NEURONTIN) 100 MG capsule Take 100 mg by mouth 3 (three) times daily.  . hydrochlorothiazide (HYDRODIURIL) 25 MG tablet Take 25 mg by mouth daily.  . hydrOXYzine (VISTARIL) 25 MG capsule Take 25 mg by mouth as  needed for anxiety.   Marland Kitchen ipratropium (ATROVENT) 0.06 % nasal spray Place 2 sprays into both nostrils 3 (three) times daily.  Marland Kitchen levocetirizine (XYZAL) 5 MG tablet Take 5 mg by mouth every evening.   . Magnesium 250 MG TABS Take 250 mg by mouth as needed (leg cramps).   . olmesartan (BENICAR) 40 MG tablet Take 40 mg by mouth at bedtime.   Marland Kitchen omeprazole (PRILOSEC) 40 MG capsule Take 40 mg by mouth daily.  . tamoxifen (NOLVADEX) 20 MG tablet TAKE 1 TABLET BY MOUTH DAILY. (Patient taking differently: Take 20 mg by mouth daily. )   No facility-administered encounter medications on file as of 04/22/2019.    ALLERGIES:  Allergies  Allergen Reactions  . Bee Venom Swelling and Other (See Comments)    Severe swelling     PHYSICAL EXAM:  ECOG Performance status: 0  Vitals:   04/22/19 0857  BP: 125/73  Pulse: 84  Resp: 14  Temp: (!) 97.5 F (36.4 C)  SpO2: 99%   Filed Weights   04/22/19 0857  Weight: 185 lb 14.4 oz (84.3 kg)    Physical Exam Vitals reviewed.  Constitutional:      Appearance: Normal appearance.  Cardiovascular:     Rate and Rhythm: Normal rate and regular rhythm.     Heart sounds: Normal heart sounds.  Pulmonary:     Effort: Pulmonary effort is normal.     Breath sounds: Normal breath  sounds.  Abdominal:     General: There is no distension.     Palpations: Abdomen is soft. There is no mass.  Lymphadenopathy:     Cervical: No cervical adenopathy.  Skin:    General: Skin is warm.  Neurological:     General: No focal deficit present.     Mental Status: She is alert and oriented to person, place, and time.  Psychiatric:        Mood and Affect: Mood normal.        Behavior: Behavior normal.      LABORATORY DATA:  I have reviewed the labs as listed.  CBC    Component Value Date/Time   WBC 5.0 04/06/2019 0831   RBC 4.20 04/06/2019 0831   HGB 12.1 04/06/2019 0831   HCT 36.4 04/06/2019 0831   PLT 254 04/06/2019 0831   MCV 86.7 04/06/2019 0831   MCH 28.8 04/06/2019 0831   MCHC 33.2 04/06/2019 0831   RDW 13.5 04/06/2019 0831   LYMPHSABS 1.6 04/06/2019 0831   MONOABS 0.3 04/06/2019 0831   EOSABS 0.1 04/06/2019 0831   BASOSABS 0.0 04/06/2019 0831   CMP Latest Ref Rng & Units 04/06/2019 11/13/2018 10/08/2017  Glucose 70 - 99 mg/dL 107(H) 116(H) 120(H)  BUN 6 - 20 mg/dL 13 13 12   Creatinine 0.44 - 1.00 mg/dL 0.51 0.54 0.57  Sodium 135 - 145 mmol/L 139 138 140  Potassium 3.5 - 5.1 mmol/L 3.2(L) 4.0 4.6  Chloride 98 - 111 mmol/L 102 104 106  CO2 22 - 32 mmol/L 27 27 27   Calcium 8.9 - 10.3 mg/dL 8.8(L) 8.9 9.0  Total Protein 6.5 - 8.1 g/dL 6.1(L) 6.3(L) 6.9  Total Bilirubin 0.3 - 1.2 mg/dL 0.3 0.2(L) 0.3  Alkaline Phos 38 - 126 U/L 44 50 78  AST 15 - 41 U/L 15 22 13(L)  ALT 0 - 44 U/L 19 29 19        DIAGNOSTIC IMAGING:  I have independently reviewed the scans and discussed with the patient.  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  Left breast high-grade DCIS: -Lumpectomy on 04/29/2018, 0.5 cm high-grade DCIS, ER 70%, PR 80%, margins negative, 0/1 lymph node positive, pTis PN 0. -She received radiation therapy. -Tamoxifen started on 05/12/2018. -She reports that her.  Says started back 3 months ago  and have been regular. -She does have some hot flashes which are tolerable. -Physical examination today did not reveal any suspicious masses.  Left breast lumpectomy scar in the lower outer quadrant is unchanged. -We reviewed mammogram dated 04/06/2019 which showed left breast calcifications. -We reviewed biopsy report dated 04/15/2018 one of the left upper outer quadrant needle biopsy showing benign breast tissue with calcifications. -We have reviewed her CBC and LFTs which are grossly within normal limits.  Vitamin D was normal. -She will come back in 6 months for follow-up.  2.  Mild hypoproteinemia: -She has mild hypoalbuminemia dating back several years.  She also has mild leg swellings. -She was instructed to eat high-protein diet.  3.  Family history: -Mother had breast and lung cancer.  Maternal aunt had breast cancer.  Maternal uncle had colon cancer.  Sister had anal cancer. -Genetic testing was negative.      Orders placed this encounter:  Orders Placed This Encounter  Procedures  . CBC with Differential/Platelet  . Comprehensive metabolic panel  . Vitamin D 25 hydroxy      Derek Jack, MD [place] [phone]

## 2019-04-22 NOTE — Progress Notes (Signed)
Pt is taking Tamoxifen as prescribed.  Pt has had sinus draining and tingling in her feet from the medication, but per pt, the symptoms do not last long and the gabapentin has helped with the tingling.

## 2019-04-23 ENCOUNTER — Encounter (HOSPITAL_COMMUNITY): Payer: Self-pay | Admitting: Hematology

## 2019-04-23 MED FILL — TAMOXIFEN 20 MG TABLET: 20 | 30 days supply | Qty: 30 | Fill #2

## 2019-04-23 NOTE — Assessment & Plan Note (Addendum)
1.  Left breast high-grade DCIS: -Lumpectomy on 04/29/2018, 0.5 cm high-grade DCIS, ER 70%, PR 80%, margins negative, 0/1 lymph node positive, pTis PN 0. -She received radiation therapy. -Tamoxifen started on 05/12/2018. -She reports that her.  Says started back 3 months ago and have been regular. -She does have some hot flashes which are tolerable. -Physical examination today did not reveal any suspicious masses.  Left breast lumpectomy scar in the lower outer quadrant is unchanged. -We reviewed mammogram dated 04/06/2019 which showed left breast calcifications. -We reviewed biopsy report dated 04/15/2018 one of the left upper outer quadrant needle biopsy showing benign breast tissue with calcifications. -We have reviewed her CBC and LFTs which are grossly within normal limits.  Vitamin D was normal. -She will come back in 6 months for follow-up.  2.  Mild hypoproteinemia: -She has mild hypoalbuminemia dating back several years.  She also has mild leg swellings. -She was instructed to eat high-protein diet.  3.  Family history: -Mother had breast and lung cancer.  Maternal aunt had breast cancer.  Maternal uncle had colon cancer.  Sister had anal cancer. -Genetic testing was negative.

## 2019-04-26 ENCOUNTER — Other Ambulatory Visit: Payer: Self-pay | Admitting: Hematology

## 2019-04-26 DIAGNOSIS — H5712 Ocular pain, left eye: Secondary | ICD-10-CM | POA: Diagnosis not present

## 2019-04-26 DIAGNOSIS — R921 Mammographic calcification found on diagnostic imaging of breast: Secondary | ICD-10-CM

## 2019-04-26 DIAGNOSIS — H5711 Ocular pain, right eye: Secondary | ICD-10-CM | POA: Diagnosis not present

## 2019-04-26 MED FILL — AZELASTINE HCL 0.05 % SOLN: 0.05 | 15 days supply | Qty: 6 | Fill #0

## 2019-05-05 ENCOUNTER — Encounter: Payer: Self-pay | Admitting: Obstetrics and Gynecology

## 2019-05-05 ENCOUNTER — Ambulatory Visit (INDEPENDENT_AMBULATORY_CARE_PROVIDER_SITE_OTHER): Payer: 59 | Admitting: Obstetrics and Gynecology

## 2019-05-05 ENCOUNTER — Other Ambulatory Visit: Payer: Self-pay

## 2019-05-05 VITALS — BP 128/81 | HR 95 | Ht 67.0 in | Wt 183.6 lb

## 2019-05-05 DIAGNOSIS — R102 Pelvic and perineal pain: Secondary | ICD-10-CM | POA: Diagnosis not present

## 2019-05-05 NOTE — Progress Notes (Signed)
Patient ID: LANDRIE BOTTONE, female   DOB: 1970/10/25, 49 y.o.   MRN: [id number]    [facility] Visit  @DATE @            Patient name: Lorraine Peters MRN [id number]  Date of birth: 21-Jun-1970  CC & HPI:  Lorraine Peters is a 49 y.o. female presenting today for evaluation of suspected bladder prolapse first noticed two weeks ago. Patient also reports three-day history of left-sided low back pain and pressure when urinating. Patient has been taking stool softeners. Patient has not had a hysterectomy. She has had one vaginal delivery at 35 weeks. History of breast cancer one year ago that was treated with lumpectomy and radiation. Patient is currently on Tamoxifen.  Of note, the patient reports being seen by [facility] four months ago for pain during sexual intercourse. She states that she was told that it was due to hormones.  ROS:  ROS  + suspected bladder prolapse  + left-sided low back pain + pressure when urinating  All systems are negative except as noted in the HPI and PMH.   Pertinent History Reviewed:   Reviewed  Medical         Past Medical History:  Diagnosis Date  . Breast cancer ([place])   . Family history of breast cancer   . Family history of colon cancer   . History of kidney stones   . Hypercholesterolemia   . Migraine   . Ovarian cyst   . Personal history of radiation therapy   . PONV (postoperative nausea and vomiting)   . Reflux                               Surgical Hx:    Past Surgical History:  Procedure Laterality Date  . BREAST LUMPECTOMY Left 2020  . COLONOSCOPY N/A 12/30/2018   Procedure: COLONOSCOPY;  Surgeon: Daneil Dolin, MD;  [facility];  Service: Endoscopy;  Laterality: N/A;  7:30  . ESOPHAGOGASTRODUODENOSCOPY N/A 09/12/2017   Procedure: ESOPHAGOGASTRODUODENOSCOPY (EGD);  Surgeon: Daneil Dolin, MD;  [facility];  Service: Endoscopy;  Laterality: N/A;  2:30pm  . PARTIAL MASTECTOMY WITH NEEDLE LOCALIZATION  AND AXILLARY SENTINEL LYMPH NODE BX Left 04/29/2018   Procedure: PARTIAL MASTECTOMY WITH NEEDLE LOCALIZATION AND AXILLARY SENTINEL LYMPH NODE BX;  Surgeon: Virl Cagey, MD;  Location: [facility];  Service: General;  Laterality: Left;  . POLYPECTOMY  12/30/2018   Procedure: POLYPECTOMY;  Surgeon: Daneil Dolin, MD;  [facility];  Service: Endoscopy;;  ascending,descending,rectal  . TUBAL LIGATION     Medications: Reviewed & Updated - see associated section                       Current Outpatient Medications:  .  atorvastatin (LIPITOR) 20 MG tablet, TAKE 1 Tablet BY MOUTH ONCE DAILY (Patient taking differently: Take 10 mg by mouth at bedtime. ), Disp: 90 tablet, Rfl: 2 .  CALCIUM PO, Take 2,400 mg by mouth daily., Disp: , Rfl:  .  Cinnamon 500 MG capsule, Take 500 mg by mouth daily., Disp: , Rfl:  .  fexofenadine (ALLEGRA) 180 MG tablet, Take 180 mg by mouth at bedtime., Disp: , Rfl:  .  hydrochlorothiazide (HYDRODIURIL) 25 MG tablet, Take 25 mg by mouth daily., Disp: , Rfl:  .  hydrOXYzine (VISTARIL) 25 MG capsule, Take 25  mg by mouth as needed for anxiety. , Disp: , Rfl:  .  levocetirizine (XYZAL) 5 MG tablet, Take 5 mg by mouth every evening. , Disp: , Rfl:  .  Magnesium 250 MG TABS, Take 250 mg by mouth as needed (leg cramps). , Disp: , Rfl:  .  nystatin-triamcinolone (MYCOLOG II) cream, Apply 1 application topically as needed. , Disp: , Rfl:  .  olmesartan (BENICAR) 40 MG tablet, Take 40 mg by mouth at bedtime. , Disp: , Rfl:  .  omeprazole (PRILOSEC) 40 MG capsule, Take 40 mg by mouth daily., Disp: , Rfl:  .  tamoxifen (NOLVADEX) 20 MG tablet, TAKE 1 TABLET BY MOUTH DAILY. (Patient taking differently: Take 20 mg by mouth daily. ), Disp: 30 tablet, Rfl: 5   Social History: Reviewed -  reports that she quit smoking about a year ago. Her smoking use included cigarettes. She has a 14.00 pack-year smoking history. She has never used smokeless tobacco.  Objective Findings:   Vitals: Blood pressure 128/81, pulse 95, height 5\' 7"  (1.702 m), weight 183 lb 9.6 oz (83.3 kg), last menstrual period 04/04/2019.  PHYSICAL EXAMINATION General appearance - alert, well appearing, and in no distress and oriented to person, place, and time Mental status - normal mood, behavior, speech, dress, motor activity, and thought processes, affect appropriate to mood  PELVIC External genitalia - Normal Vagina - Prominent urethra. No rotation and descent with valsalva maneuver. Cervix - multiparous, large, well supported Rectal - Normal  Chaperoned by Clerance Lav.   Assessment & Plan:   A:  1.  Suspected bladder prolapse, not present  P:  1.  Appears to be more of a prominent urethra, no evidence of prolapse, no rectocele   By signing my name below, I, Clerance Lav, attest that this documentation has been prepared under the direction and in the presence of Jonnie Kind, MD. Electronically Signed: [employer]. 05/05/19. 4:39 PM.  I personally performed the services described in this documentation, which was SCRIBED in my presence. The recorded information has been reviewed and considered accurate. It has been edited as necessary during review. Jonnie Kind, MD

## 2019-05-06 DIAGNOSIS — D485 Neoplasm of uncertain behavior of skin: Secondary | ICD-10-CM | POA: Diagnosis not present

## 2019-05-06 DIAGNOSIS — L820 Inflamed seborrheic keratosis: Secondary | ICD-10-CM | POA: Diagnosis not present

## 2019-05-06 DIAGNOSIS — L988 Other specified disorders of the skin and subcutaneous tissue: Secondary | ICD-10-CM | POA: Diagnosis not present

## 2019-05-17 MED FILL — OLMESARTAN MEDOXOMIL 40 MG: 40 | 30 days supply | Qty: 30 | Fill #0

## 2019-05-31 MED FILL — TAMOXIFEN 20 MG TABLET: 20 | 30 days supply | Qty: 30 | Fill #0

## 2019-06-08 ENCOUNTER — Encounter: Payer: Self-pay | Admitting: Nurse Practitioner

## 2019-06-08 ENCOUNTER — Other Ambulatory Visit: Payer: Self-pay

## 2019-06-08 ENCOUNTER — Ambulatory Visit: Payer: 59 | Admitting: Nurse Practitioner

## 2019-06-08 VITALS — BP 128/71 | HR 71 | Temp 97.8°F | Ht 68.0 in | Wt 180.2 lb

## 2019-06-08 DIAGNOSIS — R110 Nausea: Secondary | ICD-10-CM

## 2019-06-08 DIAGNOSIS — R1013 Epigastric pain: Secondary | ICD-10-CM | POA: Diagnosis not present

## 2019-06-08 DIAGNOSIS — K219 Gastro-esophageal reflux disease without esophagitis: Secondary | ICD-10-CM | POA: Diagnosis not present

## 2019-06-08 MED ORDER — SUCRALFATE 1 G PO TABS: 1.0000 g | ORAL_TABLET | Freq: Two times a day (BID) | ORAL | 2 refills | Status: DC | PRN

## 2019-06-08 MED ORDER — OMEPRAZOLE 40 MG PO CPDR: 40.0000 mg | DELAYED_RELEASE_CAPSULE | Freq: Two times a day (BID) | ORAL | 3 refills | Status: DC

## 2019-06-08 MED FILL — SUCRALFATE 1 GM TABLET: 1 | 15 days supply | Qty: 30 | Fill #0

## 2019-06-08 NOTE — Assessment & Plan Note (Signed)
Nausea, generally in the morning time and not later in the day, significantly improved.  However it is still intermittent and ongoing.  I feel she may have either food poisoning or GERD flare as per above.  Further recommendations above, follow-up in 6 weeks.

## 2019-06-08 NOTE — Progress Notes (Addendum)
Referring Provider: Celene Squibb, MD Primary Care Physician:  Celene Squibb, MD Primary GI:  Dr. Oneida Alar  Chief Complaint  Patient presents with  . Nausea    worse in the mornings, changed diet, hate plant based pork 1 week ago and has messed her up  . Abdominal Pain    "ache" upper abd    HPI:   Lorraine Peters is a 49 y.o. female who presents for nausea and diarrhea.  The patient was last seen in our office 09/01/2017 for abdominal pain, GERD, nausea without vomiting, family history of anal cancer.  At her previous visit noted naproxen for several weeks for sciatic pain.  Felt likely NSAID induced gastritis.  Advised to avoid all NSAIDs, continue PPI and start Carafate.  Labs including CBC, CMP, lipase unremarkable in the ED.  Heme-negative stool.  Noted chronic PPI use for years through medication assistance and sometimes her PPI is changed due to availability.  She was eventually switched to omeprazole 40 mg daily that worked better and had good control but persistent frequent nausea and epigastric pain.  No dysphagia.  Noted history of episodes of vomiting bile from early childhood until high school graduation that resulted in hospitalization about once a year and she eventually "grew out of it."  Describes being told that her small intestines were dilated.  Sister diagnosed with anal cancer in her 31s with advanced disease that was likely terminal, however her sister died in an MVA.  Recommended continue omeprazole, EGD, consider colonoscopy later this year given family history.  EGD completed 09/12/2017 found normal esophagus, small hiatal hernia, large pedunculated duodenal polyp status post resection (though unsure about contribution to current symptoms).  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  Recommended right upper quadrant ultrasound.  Right upper quadrant ultrasound completed 09/24/2017 which found hepatic steatosis,  negative gallbladder, no evidence of biliary obstruction.  Contact with the patient 09/17/2017 to check on symptoms and she felt better.  Recommend to keep follow-up appointment.  However, it appears that she did not follow-up until her colonoscopy.  Colonoscopy was completed 12/30/2018 which found six 4 to 7 mm polyps in the rectum, descending colon, ascending colon.  Diverticulosis in the sigmoid colon and descending colon.  Otherwise normal.  Surgical pathology found the colon adenomas to be tubular adenoma without high-grade dysplasia, serrated polyp with features of a sessile serrated polyp, and hyperplastic.  Recommended repeat colonoscopy in 3 years.  It appears the patient also carries a diagnosis of breast cancer.  Genetic testing completed 11/03/2018 and generally found to be negative with no explanation for cancer history.  Today she states she's hanging in there. She has changed her diet and trying to improve her health with breast CA diagnosis a year ago. Has lost 60-70 lbs in the past year between lifestyle changes and cancer treatment. She has early satiety since cancer treatment. Eats a lot of fruits and veggies. Overall eating a lot better. She stomach has since shrunk and has developed intolerances to certain foods (greasy, oily). About a week ago she tried to eat plant-based sausage. An hour later she began having significant nausea. This has improved, but still persistent. No diarrhea or fevers. Has had abdominal discomfort and nausea since, although nausea has improved. GERD has been doing well, tasted some recently. Her symptoms are occasionally worse after eating. Denies vomiting, hematochezia, melena, fever, chills, unintentional weight loss. No sick contacts, recent travel,  other dietary changes. Denies URI or flu-like symptoms. Denies loss of sense of taste or smell. Has not been tested for COVID-19. Has not been vaccinated, is not interested in vaccination. Denies chest pain,  dyspnea, dizziness, lightheadedness, syncope, near syncope. Denies any other upper or lower GI symptoms.  Past Medical History:  Diagnosis Date  . Breast cancer ([place])   . Family history of breast cancer   . Family history of colon cancer   . History of kidney stones   . Hypercholesterolemia   . Migraine   . Ovarian cyst   . Personal history of radiation therapy   . PONV (postoperative nausea and vomiting)   . Reflux     Past Surgical History:  Procedure Laterality Date  . BREAST LUMPECTOMY Left 2020  . COLONOSCOPY N/A 12/30/2018   Procedure: COLONOSCOPY;  Surgeon: Daneil Dolin, MD;  [facility];  Service: Endoscopy;  Laterality: N/A;  7:30  . ESOPHAGOGASTRODUODENOSCOPY N/A 09/12/2017   Procedure: ESOPHAGOGASTRODUODENOSCOPY (EGD);  Surgeon: Daneil Dolin, MD;  [facility];  Service: Endoscopy;  Laterality: N/A;  2:30pm  . PARTIAL MASTECTOMY WITH NEEDLE LOCALIZATION AND AXILLARY SENTINEL LYMPH NODE BX Left 04/29/2018   Procedure: PARTIAL MASTECTOMY WITH NEEDLE LOCALIZATION AND AXILLARY SENTINEL LYMPH NODE BX;  Surgeon: Virl Cagey, MD;  Location: [facility];  Service: General;  Laterality: Left;  . POLYPECTOMY  12/30/2018   Procedure: POLYPECTOMY;  Surgeon: Daneil Dolin, MD;  [facility];  Service: Endoscopy;;  ascending,descending,rectal  . TUBAL LIGATION      Current Outpatient Medications  Medication Sig Dispense Refill  . atorvastatin (LIPITOR) 20 MG tablet TAKE 1 Tablet BY MOUTH ONCE DAILY (Patient taking differently: Take 10 mg by mouth at bedtime. ) 90 tablet 2  . CALCIUM PO Take 2,400 mg by mouth daily.    . Cinnamon 500 MG capsule Take 500 mg by mouth daily.    . fexofenadine (ALLEGRA) 180 MG tablet Take 180 mg by mouth at bedtime.    . hydrochlorothiazide (HYDRODIURIL) 25 MG tablet Take 25 mg by mouth daily.    . hydrOXYzine (VISTARIL) 25 MG capsule Take 25 mg by mouth as needed for anxiety.     . Magnesium 250 MG TABS Take 250  mg by mouth as needed (leg cramps).     . nystatin-triamcinolone (MYCOLOG II) cream Apply 1 application topically as needed.     Marland Kitchen olmesartan (BENICAR) 40 MG tablet Take 40 mg by mouth at bedtime.     Marland Kitchen omeprazole (PRILOSEC) 40 MG capsule Take 40 mg by mouth daily.    . polyethylene glycol (MIRALAX / GLYCOLAX) 17 g packet Take 17 g by mouth daily.    . tamoxifen (NOLVADEX) 20 MG tablet TAKE 1 TABLET BY MOUTH DAILY. (Patient taking differently: Take 20 mg by mouth daily. ) 30 tablet 5   No current facility-administered medications for this visit.    Allergies as of 06/08/2019 - Review Complete 06/08/2019  Allergen Reaction Noted  . Bee venom Swelling and Other (See Comments) 04/29/2011    Family History  Problem Relation Age of Onset  . Arthritis Mother   . Cancer Mother        breast  . Breast cancer Mother 31       recurrance at 3  . COPD Father   . Heart disease Father   . Hyperlipidemia Father   . Hypertension Father   . Cancer Sister 31  anal, deceased due to mva but had advanced disease  . Stroke Maternal Grandfather   . Breast cancer Maternal Aunt 58  . Breast cancer Maternal Uncle        colon    Social History   Socioeconomic History  . Marital status: Divorced    Spouse name: Not on file  . Number of children: 1  . Years of education: Not on file  . Highest education level: Not on file  Occupational History  . Not on file  Tobacco Use  . Smoking status: Former Smoker    Packs/day: 0.50    Years: 28.00    Pack years: 14.00    Types: Cigarettes    Quit date: 04/22/2018    Years since quitting: 1.1  . Smokeless tobacco: Never Used  Substance and Sexual Activity  . Alcohol use: No    Alcohol/week: 0.0 standard drinks  . Drug use: No  . Sexual activity: Not Currently    Birth control/protection: Surgical    Comment: tubal  Other Topics Concern  . Not on file  Social History Narrative  . Not on file   Social Determinants of Health   Financial  Resource Strain:   . Difficulty of Paying Living Expenses:   Food Insecurity:   . Worried About Charity fundraiser in the Last Year:   . Arboriculturist in the Last Year:   Transportation Needs:   . Film/video editor (Medical):   Marland Kitchen Lack of Transportation (Non-Medical):   Physical Activity:   . Days of Exercise per Week:   . Minutes of Exercise per Session:   Stress:   . Feeling of Stress :   Social Connections:   . Frequency of Communication with Friends and Family:   . Frequency of Social Gatherings with Friends and Family:   . Attends Religious Services:   . Active Member of Clubs or Organizations:   . Attends Archivist Meetings:   Marland Kitchen Marital Status:     Subjective: Review of Systems  Constitutional: Positive for weight loss. Negative for chills, fever and malaise/fatigue.  HENT: Negative for congestion and sore throat.   Respiratory: Negative for cough and shortness of breath.   Cardiovascular: Negative for chest pain and palpitations.  Gastrointestinal: Positive for abdominal pain and nausea. Negative for blood in stool, diarrhea, melena and vomiting.  Musculoskeletal: Negative for joint pain and myalgias.  Skin: Negative for rash.  Neurological: Negative for dizziness and weakness.  Endo/Heme/Allergies: Does not bruise/bleed easily.  Psychiatric/Behavioral: Negative for depression. The patient is not nervous/anxious.   All other systems reviewed and are negative.    Objective: BP 128/71   Pulse 71   Temp 97.8 F (36.6 C) (Oral)   Ht 5\' 8"  (1.727 m)   Wt 180 lb 3.2 oz (81.7 kg)   BMI 27.40 kg/m  Physical Exam Vitals and nursing note reviewed.  Constitutional:      General: She is not in acute distress.    Appearance: She is well-developed and normal weight. She is not ill-appearing, toxic-appearing or diaphoretic.  HENT:     Head: Normocephalic and atraumatic.  Eyes:     General: No scleral icterus. Cardiovascular:     Rate and Rhythm:  Normal rate and regular rhythm.     Heart sounds: Normal heart sounds.  Pulmonary:     Effort: Pulmonary effort is normal. No respiratory distress.     Breath sounds: Normal breath sounds.  Abdominal:  General: Bowel sounds are normal.     Palpations: Abdomen is soft. There is no hepatomegaly, splenomegaly or mass.     Tenderness: There is abdominal tenderness in the right upper quadrant, epigastric area and left upper quadrant. There is no guarding or rebound.     Hernia: No hernia is present.  Skin:    General: Skin is warm and dry.     Coloration: Skin is not jaundiced.     Findings: No rash.  Neurological:     General: No focal deficit present.     Mental Status: She is alert and oriented to person, place, and time.  Psychiatric:        Attention and Perception: Attention normal.        Mood and Affect: Mood normal. Mood is not anxious or depressed.        Speech: Speech normal.        Behavior: Behavior normal.        Thought Content: Thought content normal.        Cognition and Memory: Cognition and memory normal.       06/08/2019 9:02 AM   Disclaimer: This note was dictated with voice recognition software. Similar sounding words can inadvertently be transcribed and may not be corrected upon review.

## 2019-06-08 NOTE — Patient Instructions (Signed)
Your health issues we discussed today were:   Worsening GERD-like symptoms, abdominal pain, nausea: 1. As we discussed, I feel you may either have food poisoning or a worsening of your GERD symptoms since eating plant-based sausage 2. I would avoid this food in the future 3. I have increased your acid blocker to 40 mg twice a day.  We will do this for about the next 2 to 3 months 4. I have sent in a prescription for Carafate tablets that she can take up to twice a day as needed.  Crush the tablet mix into a slurry with a little bit of water, then drink. 5. Call us if you have any worsening or severe symptoms  Overall I recommend:  1. Continue your other current medications 2. Return for follow-up in 6 weeks 3. Call us if you have any questions or concerns.   At [facility] we value your feedback. You may receive a survey about your visit today. Please share your experience as we strive to create trusting relationships with our patients to provide genuine, compassionate, quality care.  We appreciate your understanding and patience as we review any laboratory studies, imaging, and other diagnostic tests that are ordered as we care for you. Our office policy is 5 business days for review of these results, and any emergent or urgent results are addressed in a timely manner for your best interest. If you do not hear from our office in 1 week, please contact us.   We also encourage the use of MyChart, which contains your medical information for your review as well. If you are not enrolled in this feature, an access code is on this after visit summary for your convenience. Thank you for allowing [place] to be involved in your care.  It was great to see you today!  I hope you have a great day!!

## 2019-06-08 NOTE — Assessment & Plan Note (Signed)
Epigastric abdominal pain since eating "plant-based sausage" that caused immediate symptoms about 1 hour after eating.  Persistent epigastric discomfort, persistent though improved nausea.  Further recommendations above.  Follow-up in 6 weeks.

## 2019-06-08 NOTE — Assessment & Plan Note (Signed)
History of GERD which has generally been well managed until recent experience where she ate a new type of food and has had a sense of exacerbation in her symptoms.  She is even had some reflux type symptoms this morning.  Her symptoms are generally morning, including nausea and abdominal discomfort in the epigastric region.  Query nighttime GERD/recurrent or worsening GERD due to new dietary intake.  At this point I will increase her PPI to twice daily.  I will send in Carafate as needed.  Follow-up in 6 weeks.

## 2019-06-14 MED FILL — OLMESARTAN MEDOXOMIL 40 MG: 40 | 30 days supply | Qty: 30 | Fill #0

## 2019-06-14 MED FILL — OMEPRAZOLE 40 MG CPDR: 40 | 30 days supply | Qty: 60 | Fill #0

## 2019-06-28 MED FILL — TAMOXIFEN 20 MG TABLET: 20 | 30 days supply | Qty: 30 | Fill #1

## 2019-06-29 ENCOUNTER — Encounter (HOSPITAL_COMMUNITY): Payer: Self-pay | Admitting: *Deleted

## 2019-06-29 ENCOUNTER — Emergency Department (HOSPITAL_COMMUNITY): Payer: 59

## 2019-06-29 ENCOUNTER — Other Ambulatory Visit: Payer: Self-pay

## 2019-06-29 ENCOUNTER — Emergency Department (HOSPITAL_COMMUNITY)
Admission: EM | Admit: 2019-06-29 | Discharge: 2019-06-29 | Disposition: A | Discharge status: Home / Self Care | Payer: 59 | Attending: Emergency Medicine | Admitting: Emergency Medicine

## 2019-06-29 DIAGNOSIS — Z79899 Other long term (current) drug therapy: Secondary | ICD-10-CM | POA: Insufficient documentation

## 2019-06-29 DIAGNOSIS — R42 Dizziness and giddiness: Secondary | ICD-10-CM | POA: Insufficient documentation

## 2019-06-29 DIAGNOSIS — E876 Hypokalemia: Secondary | ICD-10-CM | POA: Insufficient documentation

## 2019-06-29 DIAGNOSIS — Z87891 Personal history of nicotine dependence: Secondary | ICD-10-CM | POA: Insufficient documentation

## 2019-06-29 DIAGNOSIS — R079 Chest pain, unspecified: Secondary | ICD-10-CM | POA: Diagnosis not present

## 2019-06-29 LAB — COMPREHENSIVE METABOLIC PANEL
ALT: 18 U/L (ref 0–44)
AST: 18 U/L (ref 15–41)
Albumin: 4 g/dL (ref 3.5–5.0)
Alkaline Phosphatase: 50 U/L (ref 38–126)
Anion gap: 14 (ref 5–15)
BUN: 16 mg/dL (ref 6–20)
CO2: 20 mmol/L — ABNORMAL LOW (ref 22–32)
Calcium: 9.3 mg/dL (ref 8.9–10.3)
Chloride: 103 mmol/L (ref 98–111)
Creatinine, Ser: 0.54 mg/dL (ref 0.44–1.00)
GFR calc Af Amer: >60 mL/min (ref >60)
GFR calc non Af Amer: >60 mL/min (ref >60)
Glucose, Bld: 102 mg/dL — ABNORMAL HIGH (ref 70–99)
Potassium: 3.3 mmol/L — ABNORMAL LOW (ref 3.5–5.1)
Sodium: 137 mmol/L (ref 135–145)
Total Bilirubin: 0.4 mg/dL (ref 0.3–1.2)
Total Protein: 7.2 g/dL (ref 6.5–8.1)

## 2019-06-29 LAB — CBC WITH DIFFERENTIAL/PLATELET
Abs Immature Granulocytes: 0.01 10*3/uL (ref 0.00–0.07)
Basophils Absolute: 0 10*3/uL (ref 0.0–0.1)
Basophils Relative: 0 %
Eosinophils Absolute: 0.1 10*3/uL (ref 0.0–0.5)
Eosinophils Relative: 1 %
HCT: 41.5 % (ref 36.0–46.0)
Hemoglobin: 13.4 g/dL (ref 12.0–15.0)
Immature Granulocytes: 0 %
Lymphocytes Relative: 34 %
Lymphs Abs: 2.8 10*3/uL (ref 0.7–4.0)
MCH: 28.3 pg (ref 26.0–34.0)
MCHC: 32.3 g/dL (ref 30.0–36.0)
MCV: 87.7 fL (ref 80.0–100.0)
Monocytes Absolute: 0.6 10*3/uL (ref 0.1–1.0)
Monocytes Relative: 8 %
Neutro Abs: 4.8 10*3/uL (ref 1.7–7.7)
Neutrophils Relative %: 57 %
Platelets: 245 10*3/uL (ref 150–400)
RBC: 4.73 MIL/uL (ref 3.87–5.11)
RDW: 13.9 % (ref 11.5–15.5)
WBC: 8.4 10*3/uL (ref 4.0–10.5)
nRBC: 0 % (ref 0.0–0.2)

## 2019-06-29 LAB — CBG MONITORING, ED: Glucose-Capillary: 92 mg/dL (ref 70–99)

## 2019-06-29 LAB — TROPONIN I (HIGH SENSITIVITY)
Troponin I (High Sensitivity): 2 ng/L (ref <18)
Troponin I (High Sensitivity): 3 ng/L (ref <18)

## 2019-06-29 LAB — LIPASE, BLOOD: Lipase: 25 U/L (ref 11–51)

## 2019-06-29 MED ORDER — MECLIZINE HCL 25 MG PO TABS: 25.0000 mg | ORAL_TABLET | Freq: Three times a day (TID) | ORAL | 0 refills | Status: DC | PRN

## 2019-06-29 MED ORDER — ONDANSETRON HCL 4 MG PO TABS: 4.0000 mg | ORAL_TABLET | Freq: Four times a day (QID) | ORAL | 0 refills | Status: DC

## 2019-06-29 MED FILL — MECLIZINE 25 MG TABLET: 25 | 10 days supply | Qty: 30 | Fill #0

## 2019-06-29 MED FILL — ONDANSETRON HCL 4 MG TABLET: 4 | 3 days supply | Qty: 12 | Fill #0

## 2019-06-29 NOTE — ED Provider Notes (Signed)
[facility] Provider Note   CSN: [id number] Arrival date & time: 06/29/19  1443     History Chief Complaint  Patient presents with  . Dizziness    Lorraine Peters is a 49 y.o. female.  HPI   This patient is a very pleasant 49 year old female, she has a known history of breast cancer, history of hypercholesterolemia and a history of migraine disease as well as acid reflux.  She reports that she had acute onset of a feeling of the room spinning while she was looking down to check her mail today.  She has just been outside to check the garbage and bring the garbage cans in.  A neighbor found her severely diaphoretic, feeling off balance and acutely dizzy.  She did have a feeling of palpitations and was noted to have a heart rate of 130, she presents by private vehicle able to ambulate into the building with a small amount of assistance stating that she is feeling much better at this time.  She does have a feeling of some left-sided chest pain which has been present for several weeks, seems to be when she touches her pulse on the skin on the left side of her breast and side of the chest.  This does not seem to get worse with exertion nor does it radiate into the shoulders.  No swelling of the legs, no dysuria diarrhea nausea or vomiting.  At this time the patient is feeling much better though still feeling slightly dizzy.  She does have a history of vertigo when she was younger, she states this felt distinctly different than the vertigo but very similar with the feeling of movement  Past Medical History:  Diagnosis Date  . Breast cancer ([place])   . Family history of breast cancer   . Family history of colon cancer   . History of kidney stones   . Hypercholesterolemia   . Migraine   . Ovarian cyst   . Personal history of radiation therapy   . PONV (postoperative nausea and vomiting)   . Reflux     Patient Active Problem List   Diagnosis Date Noted  . Genetic testing  11/03/2018  . Family history of breast cancer   . Family history of colon cancer   . 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 Swelling of left hand 05/19/2018  . Neuropathic pain of hand, left 05/19/2018  . 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 Abdominal pain, epigastric 09/01/2017  . Nausea without vomiting 09/01/2017  . Family history of carcinoma in situ of anal canal 09/01/2017  . Pain in right wrist 06/02/2017  . Pelvic pain in female 03/22/2015  . History of ovarian cyst 03/22/2015  . Abnormal vaginal bleeding 03/22/2015  . Hyperlipidemia 02/21/2015  . Esophageal reflux 02/21/2015  . Cigarette nicotine dependence, uncomplicated 0000000  . Obesity, unspecified 02/21/2015  . Excessive bleeding in premenopausal period 02/21/2015    Past Surgical History:  Procedure Laterality Date  . BREAST LUMPECTOMY Left 2020  . COLONOSCOPY N/A 12/30/2018   Procedure: COLONOSCOPY;  Surgeon: Daneil Dolin, MD;  [facility];  Service: Endoscopy;  Laterality: N/A;  7:30  . ESOPHAGOGASTRODUODENOSCOPY N/A 09/12/2017   Procedure: ESOPHAGOGASTRODUODENOSCOPY (EGD);  Surgeon: Daneil Dolin, MD;  Location: [facility];  Service: Endoscopy;  Laterality: N/A;  2:30pm  . PARTIAL MASTECTOMY WITH NEEDLE LOCALIZATION AND AXILLARY SENTINEL LYMPH NODE BX Left 04/29/2018   Procedure: PARTIAL MASTECTOMY  WITH NEEDLE LOCALIZATION AND AXILLARY SENTINEL LYMPH NODE BX;  Surgeon: Virl Cagey, MD;  Location: [facility];  Service: General;  Laterality: Left;  . POLYPECTOMY  12/30/2018   Procedure: POLYPECTOMY;  Surgeon: Daneil Dolin, MD;  [facility];  Service: Endoscopy;;  ascending,descending,rectal  . TUBAL LIGATION       OB History    Gravida  1   Para  1   Term      Preterm  1   AB      Living  1     SAB      TAB      Ectopic      Multiple      Live Births  1           Family History  Problem  Relation Age of Onset  . Arthritis Mother   . Cancer Mother        breast  . Breast cancer Mother 77       recurrance at 58  . COPD Father   . Heart disease Father   . Hyperlipidemia Father   . Hypertension Father   . Cancer Sister 85       anal, deceased due to mva but had advanced disease  . Stroke Maternal Grandfather   . Breast cancer Maternal Aunt 58  . Breast cancer Maternal Uncle        colon    Social History   Tobacco Use  . Smoking status: Former Smoker    Packs/day: 0.50    Years: 28.00    Pack years: 14.00    Types: Cigarettes    Quit date: 04/22/2018    Years since quitting: 1.1  . Smokeless tobacco: Never Used  Substance Use Topics  . Alcohol use: No    Alcohol/week: 0.0 standard drinks  . Drug use: No    Home Medications Prior to Admission medications   Medication Sig Start Date End Date Taking? Authorizing Provider  atorvastatin (LIPITOR) 20 MG tablet TAKE 1 Tablet BY MOUTH ONCE DAILY Patient taking differently: Take 10 mg by mouth at bedtime.  04/07/17   Soyla Dryer, PA-C  CALCIUM PO Take 2,400 mg by mouth daily.    [provider]  Cinnamon 500 MG capsule Take 500 mg by mouth daily.    [provider]  fexofenadine (ALLEGRA) 180 MG tablet Take 180 mg by mouth at bedtime.    [provider]  hydrochlorothiazide (HYDRODIURIL) 25 MG tablet Take 25 mg by mouth daily.    [provider]  hydrOXYzine (VISTARIL) 25 MG capsule Take 25 mg by mouth as needed for anxiety.     [provider]  Magnesium 250 MG TABS Take 250 mg by mouth as needed (leg cramps).     [provider]  meclizine (ANTIVERT) 25 MG tablet Take 1 tablet (25 mg total) by mouth 3 (three) times daily as needed for dizziness. 06/29/19   Noemi Chapel, MD  nystatin-triamcinolone [facility] II) cream Apply 1 application topically as needed.  01/29/19   [provider]  olmesartan (BENICAR) 40 MG tablet Take 40 mg by mouth at bedtime.      [provider]  omeprazole (PRILOSEC) 40 MG capsule Take 1 capsule (40 mg total) by mouth in the morning and at bedtime. 06/08/19   Carlis Stable, NP  ondansetron (ZOFRAN) 4 MG tablet Take 1 tablet (4 mg total) by mouth every 6 (six) hours. 06/29/19   Sabra Heck,  Aaron Edelman, MD  polyethylene glycol (MIRALAX / GLYCOLAX) 17 g packet Take 17 g by mouth daily.    [provider]  sucralfate (CARAFATE) 1 g tablet Take 1 tablet (1 g total) by mouth 2 (two) times daily as needed. Crush and mix with water to make slurry 06/08/19   Carlis Stable, NP  tamoxifen (NOLVADEX) 20 MG tablet TAKE 1 TABLET BY MOUTH DAILY. Patient taking differently: Take 20 mg by mouth daily.  11/27/18   Glennie Isle, NP-C    Allergies    Bee venom  Review of Systems   Review of Systems  All other systems reviewed and are negative.   Physical Exam Updated Vital Signs BP 124/79   Pulse 65   Temp 98.1 F (36.7 C) (Oral)   Resp 18   Ht 1.727 m (5\' 8" )   Wt 81.6 kg   SpO2 97%   BMI 27.37 kg/m   Physical Exam Vitals and nursing note reviewed.  Constitutional:      General: She is not in acute distress.    Appearance: She is well-developed.  HENT:     Head: Normocephalic and atraumatic.     Mouth/Throat:     Pharynx: No oropharyngeal exudate.  Eyes:     General: No scleral icterus.       Right eye: No discharge.        Left eye: No discharge.     Conjunctiva/sclera: Conjunctivae normal.     Pupils: Pupils are equal, round, and reactive to light.  Neck:     Thyroid: No thyromegaly.     Vascular: No JVD.  Cardiovascular:     Rate and Rhythm: Normal rate and regular rhythm.     Heart sounds: Normal heart sounds. No murmur. No friction rub. No gallop.   Pulmonary:     Effort: Pulmonary effort is normal. No respiratory distress.     Breath sounds: Normal breath sounds. No wheezing or rales.  Abdominal:     General: Bowel sounds are normal. There is no distension.     Palpations: Abdomen is  soft. There is no mass.     Tenderness: There is no abdominal tenderness.  Musculoskeletal:        General: No tenderness. Normal range of motion.     Cervical back: Normal range of motion and neck supple.  Lymphadenopathy:     Cervical: No cervical adenopathy.  Skin:    General: Skin is warm and dry.     Findings: No erythema or rash.  Neurological:     Mental Status: She is alert.     Coordination: Coordination normal.     Comments: Normal speech coordination and finger-nose-finger, normal strength in all 4 extremities, normal sensation all 4 extremities, no facial droop, cranial nerves III through XII are normal.  Memory is completely intact, there is no nystagmus  Psychiatric:        Behavior: Behavior normal.     ED Results / Procedures / Treatments   Labs (all labs ordered are listed, but only abnormal results are displayed) Labs Reviewed  COMPREHENSIVE METABOLIC PANEL - Abnormal; Notable for the following components:      Result Value   Potassium 3.3 (*)    CO2 20 (*)    Glucose, Bld 102 (*)    All other components within normal limits  CBC WITH DIFFERENTIAL/PLATELET  LIPASE, BLOOD  CBG MONITORING, ED  TROPONIN I (HIGH SENSITIVITY)  TROPONIN I (HIGH SENSITIVITY)    EKG  None  Radiology DG Chest Port 1 View  Result Date: 06/29/2019 CLINICAL DATA:  49 year old female with left-sided chest pain. EXAM: PORTABLE CHEST 1 VIEW COMPARISON:  Chest radiograph dated 05/24/2016. FINDINGS: The lungs are clear. There is no pleural effusion or pneumothorax. The cardiac silhouette is within normal limits. No acute osseous pathology. Surgical clips in the left lateral chest wall. IMPRESSION: No active disease. Electronically Signed   By: Anner Crete M.D.   On: 06/29/2019 16:08    Procedures Procedures (including critical care time)  Medications Ordered in ED Medications - No data to display  ED Course  I have reviewed the triage vital signs and the nursing  notes.  Pertinent labs & imaging results that were available during my care of the patient were reviewed by me and considered in my medical decision making (see chart for details).    MDM Rules/Calculators/A&P                       This patient presents to the ED for concern of vertigo, this involves an extensive number of treatment options, and is a complaint that carries with it a high risk of complications and morbidity.  The differential diagnosis includes inner ear vertigo, central vertigo though less likely, electrolyte disturbance, cardiac abnormalities or arrhythmia   Lab Tests:   I Ordered, reviewed, and interpreted labs, which included CBC, CMP, troponin, lipase, chest x-ray  Medicines ordered:   The patient declines medications at this time  Imaging Studies ordered:   I ordered imaging studies which included portable chest x-ray and  I independently visualized and interpreted imaging which showed no acute findings in the chest  Additional history obtained:   Additional history obtained from the neighbor who is at the bedside as well as the electronic medical record  Previous records obtained and reviewed   Consultations Obtained:   I consulted with the patient and her friend and discussed lab and imaging findings  Reevaluation:  After the interventions stated above, I reevaluated the patient and found improved, I was able to fully range of motion the patient's head without recurrence of her symptoms.  She is not having active vertigo, she is well-appearing with only mild hypokalemia which she reports is chronic.  She is stable for discharge  Critical Interventions:  . Evaluation for causes of the patient's left-sided chest discomfort as well as metabolic causes of possible vertigo.  The patient is well-appearing stable for discharge, expressed her understanding   Final Clinical Impression(s) / ED Diagnoses Final diagnoses:  Dizziness  Hypokalemia     Rx / DC Orders ED Discharge Orders         Ordered    meclizine (ANTIVERT) 25 MG tablet  3 times daily PRN     06/29/19 1738    ondansetron (ZOFRAN) 4 MG tablet  Every 6 hours     06/29/19 1738           Noemi Chapel, MD 06/29/19 1739

## 2019-06-29 NOTE — Discharge Instructions (Signed)
Your testing shows a very slight decrease in your potassium, please read the attached instructions regarding low potassium and foods that you can eat that are rich in potassium.  You have no abnormal findings on your x-ray and your blood and heart tests are all normal.  You should see your doctor within 1 week for recheck if still having symptoms, you may take meclizine up to every 8 hours as needed for dizziness, Zofran as needed for nausea, seek medical exam immediately for severe or worsening symptoms including any numbness weakness changes in vision or speech or facial droop.

## 2019-06-29 NOTE — ED Triage Notes (Signed)
Pt states she was getting ready to leave for work and was taking the trash cans to the road when started started feeling dizzy and diaphoretic. Pt's neighbor arrived and found pt diaphoretic, pale and had a heart rate in the 130's; pt c/o pain to left upper quadrant

## 2019-07-01 ENCOUNTER — Ambulatory Visit (INDEPENDENT_AMBULATORY_CARE_PROVIDER_SITE_OTHER): Payer: 59 | Admitting: Nurse Practitioner

## 2019-07-01 ENCOUNTER — Encounter: Payer: Self-pay | Admitting: Nurse Practitioner

## 2019-07-01 ENCOUNTER — Other Ambulatory Visit: Payer: Self-pay

## 2019-07-01 VITALS — BP 108/73 | HR 74 | Temp 96.9°F | Ht 68.0 in | Wt 178.6 lb

## 2019-07-01 DIAGNOSIS — K219 Gastro-esophageal reflux disease without esophagitis: Secondary | ICD-10-CM | POA: Diagnosis not present

## 2019-07-01 DIAGNOSIS — K5900 Constipation, unspecified: Secondary | ICD-10-CM | POA: Diagnosis not present

## 2019-07-01 DIAGNOSIS — R0789 Other chest pain: Secondary | ICD-10-CM | POA: Diagnosis not present

## 2019-07-01 NOTE — Assessment & Plan Note (Signed)
Previous constipation now currently well managed on MiraLAX every other day.  She is having [place] 4 stools without straining.  No diarrhea.  Recommend she continue her current MiraLAX regimen as it seems to be working well.  Follow-up in 4 months.

## 2019-07-01 NOTE — Assessment & Plan Note (Signed)
GERD symptoms currently doing quite well on Prilosec daily.  No recent breakthrough symptoms.  Recommend she continue her current PPI as it seems to be managing her symptoms well.  Follow-up in 4 months.

## 2019-07-01 NOTE — Assessment & Plan Note (Signed)
The patient initially described abdominal pain but on further discussion and exam it seems to be left chest wall/intercostal pain near the left axilla.  No significant/severe pain on palpation.  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  Tylenol and rest has improved.  I feels likely musculoskeletal etiology such as pulled intercostal muscle.  I recommended that she avoid heavy lifting for the time being, continue Tylenol as needed, consider heat versus ice, mild stretching and continued mobility.  Call for any worsening pain.  Otherwise follow-up in 4 months.

## 2019-07-01 NOTE — Progress Notes (Signed)
Referring Provider: Celene Squibb, MD Primary Care Physician:  Celene Squibb, MD Primary GI:  Dr. Oneida Alar  Chief Complaint  Patient presents with  . Abdominal Pain    left side, tender to touch    HPI:   Lorraine Peters is a 49 y.o. female who presents for follow-up on nausea and abdominal pain.  Patient was last seen in our office 06/08/2019 for GERD, nausea, abdominal pain/epigastric.  Noted family history of anal cancer.  Noted personal history of breast cancer.  History of frequent NSAID use.  History of chronic vomiting during childhood but eventually "grew out of it."  EGD in 2019 with a large pedunculated duodenal polyp status post resection, though unclear clinical contribution.  Polyp was Erick Blinks gland hematoma without adenomatous change or malignancy and recommended right upper quadrant ultrasound.  This was completed and found hepatic steatosis, otherwise normal.  Colonoscopy 12/30/2018 which found six 4 to 7 mm polyps in the rectum, descending colon, ascending colon.  Diverticulosis in the sigmoid colon and descending colon.  Otherwise normal.  Surgical pathology found the colon adenomas to be tubular adenoma without high-grade dysplasia, serrated polyp with features of a sessile serrated polyp, and hyperplastic. Recommended repeat colonoscopy in 3 years (2023).  At her last visit she noted she been diagnosed with breast cancer about a year ago and has made significant lifestyle changes for better health, losing 60 to 70 pounds in the previous year.  Since cancer treatment notes early satiety.  Developed intolerances to greasy and oily foods.  She noted significant nausea after eating "plant-based sausage" but denied fever or diarrhea.  Also with a abdominal discomfort since, although nausea has somewhat improved.  GERD doing well on PPI.  No sick contacts, recent travel, significant dietary changes other than as noted.  No other overt GI complaints.  Overall felt possible tainted food or  worsening GERD.  Recommended avoid the plant-based sausage in the future, increase PPI to 40 mg twice a day for the next 2 to 3 months, Carafate twice a day as needed, follow-up in 6 weeks.  Today she states she's doing ok overall. Still with some abdominal pain on the left side area, tender to the touch. Had recent dizziness spell and went to the ER, but no definitive cause. Right sided pain worsened a few days ago. She moved her appointment up. Denies other abdominal pain, N/V, hematochezia, melena. No fever, chills. Has a bruised feeling. Was kept awake all night the other day due to it. Recent left breast biopsy a week ago and wonders if it's related. Has gotten somewhat better with Tylenol. GERD well controlled on PPI. No constipation on MiraLAX every other day. Denies unintentional weight loss. Denies URI or flu-like symptoms. Denies loss of sense of taste or smell. She has not gotten a COVID-19 vaccine. She declines information about the vaccine today. Denies chest pain, dyspnea, dizziness, lightheadedness, syncope, near syncope. Denies any other upper or lower GI symptoms.  Past Medical History:  Diagnosis Date  . Breast cancer ([place])   . Family history of breast cancer   . Family history of colon cancer   . History of kidney stones   . Hypercholesterolemia   . Migraine   . Ovarian cyst   . Personal history of radiation therapy   . PONV (postoperative nausea and vomiting)   . Reflux     Past Surgical History:  Procedure Laterality Date  . BREAST LUMPECTOMY Left 2020  . COLONOSCOPY  N/A 12/30/2018   Procedure: COLONOSCOPY;  Surgeon: Daneil Dolin, MD;  [facility];  Service: Endoscopy;  Laterality: N/A;  7:30  . ESOPHAGOGASTRODUODENOSCOPY N/A 09/12/2017   Procedure: ESOPHAGOGASTRODUODENOSCOPY (EGD);  Surgeon: Daneil Dolin, MD;  [facility];  Service: Endoscopy;  Laterality: N/A;  2:30pm  . PARTIAL MASTECTOMY WITH NEEDLE LOCALIZATION AND AXILLARY SENTINEL LYMPH  NODE BX Left 04/29/2018   Procedure: PARTIAL MASTECTOMY WITH NEEDLE LOCALIZATION AND AXILLARY SENTINEL LYMPH NODE BX;  Surgeon: Virl Cagey, MD;  Location: [facility];  Service: General;  Laterality: Left;  . POLYPECTOMY  12/30/2018   Procedure: POLYPECTOMY;  Surgeon: Daneil Dolin, MD;  [facility];  Service: Endoscopy;;  ascending,descending,rectal  . TUBAL LIGATION      Current Outpatient Medications  Medication Sig Dispense Refill  . atorvastatin (LIPITOR) 20 MG tablet TAKE 1 Tablet BY MOUTH ONCE DAILY (Patient taking differently: Take 10 mg by mouth at bedtime. ) 90 tablet 2  . CALCIUM PO Take 2,400 mg by mouth daily.    . Cinnamon 500 MG capsule Take 500 mg by mouth daily.    . fexofenadine (ALLEGRA) 180 MG tablet Take 180 mg by mouth at bedtime.    . hydrochlorothiazide (HYDRODIURIL) 25 MG tablet Take 25 mg by mouth daily.    . hydrOXYzine (VISTARIL) 25 MG capsule Take 25 mg by mouth as needed for anxiety.     . Magnesium 250 MG TABS Take 250 mg by mouth as needed (leg cramps).     . meclizine (ANTIVERT) 25 MG tablet Take 1 tablet (25 mg total) by mouth 3 (three) times daily as needed for dizziness. 30 tablet 0  . nystatin-triamcinolone (MYCOLOG II) cream Apply 1 application topically as needed.     Marland Kitchen olmesartan (BENICAR) 40 MG tablet Take 40 mg by mouth at bedtime.     Marland Kitchen omeprazole (PRILOSEC) 40 MG capsule Take 1 capsule (40 mg total) by mouth in the morning and at bedtime. 60 capsule 3  . polyethylene glycol (MIRALAX / GLYCOLAX) 17 g packet Take 17 g by mouth every other day.     . tamoxifen (NOLVADEX) 20 MG tablet TAKE 1 TABLET BY MOUTH DAILY. (Patient taking differently: Take 20 mg by mouth daily. ) 30 tablet 5   No current facility-administered medications for this visit.    Allergies as of 07/01/2019 - Review Complete 07/01/2019  Allergen Reaction Noted  . Bee venom Swelling and Other (See Comments) 04/29/2011    Family History  Problem Relation Age of  Onset  . Arthritis Mother   . Cancer Mother        breast  . Breast cancer Mother 27       recurrance at 19  . COPD Father   . Heart disease Father   . Hyperlipidemia Father   . Hypertension Father   . Cancer Sister 68       anal, deceased due to mva but had advanced disease  . Stroke Maternal Grandfather   . Breast cancer Maternal Aunt 58  . Breast cancer Maternal Uncle        colon    Social History   Socioeconomic History  . Marital status: Divorced    Spouse name: Not on file  . Number of children: 1  . Years of education: Not on file  . Highest education level: Not on file  Occupational History  . Not on file  Tobacco Use  . Smoking status: Former Smoker  Packs/day: 0.50    Years: 28.00    Pack years: 14.00    Types: Cigarettes    Quit date: 04/22/2018    Years since quitting: 1.1  . Smokeless tobacco: Never Used  Substance and Sexual Activity  . Alcohol use: No    Alcohol/week: 0.0 standard drinks  . Drug use: No  . Sexual activity: Not Currently    Birth control/protection: Surgical    Comment: tubal  Other Topics Concern  . Not on file  Social History Narrative  . Not on file   Social Determinants of Health   Financial Resource Strain:   . Difficulty of Paying Living Expenses:   Food Insecurity:   . Worried About Charity fundraiser in the Last Year:   . Arboriculturist in the Last Year:   Transportation Needs:   . Film/video editor (Medical):   Marland Kitchen Lack of Transportation (Non-Medical):   Physical Activity:   . Days of Exercise per Week:   . Minutes of Exercise per Session:   Stress:   . Feeling of Stress :   Social Connections:   . Frequency of Communication with Friends and Family:   . Frequency of Social Gatherings with Friends and Family:   . Attends Religious Services:   . Active Member of Clubs or Organizations:   . Attends Archivist Meetings:   Marland Kitchen Marital Status:     Subjective: Review of Systems  Constitutional:  Negative for chills, fever, malaise/fatigue and weight loss.  HENT: Negative for congestion and sore throat.   Respiratory: Negative for cough and shortness of breath.   Cardiovascular: Negative for chest pain and palpitations.  Gastrointestinal: Positive for abdominal pain (far left, upper side; more costal pain). Negative for blood in stool, diarrhea, melena, nausea and vomiting.  Musculoskeletal: Negative for joint pain and myalgias.  Skin: Negative for rash.  Neurological: Negative for dizziness and weakness.  Endo/Heme/Allergies: Does not bruise/bleed easily.  Psychiatric/Behavioral: Negative for depression. The patient is not nervous/anxious.   All other systems reviewed and are negative.    Objective: BP 108/73   Pulse 74   Temp (!) 96.9 F (36.1 C) (Temporal)   Ht 5\' 8"  (1.727 m)   Wt 178 lb 9.6 oz (81 kg)   BMI 27.16 kg/m  Physical Exam Vitals and nursing note reviewed.  Constitutional:      General: She is not in acute distress.    Appearance: Normal appearance. She is well-developed. She is not ill-appearing, toxic-appearing or diaphoretic.  HENT:     Head: Normocephalic and atraumatic.     Nose: No congestion or rhinorrhea.  Eyes:     General: No scleral icterus. Cardiovascular:     Rate and Rhythm: Normal rate and regular rhythm.     Heart sounds: Normal heart sounds.  Pulmonary:     Effort: Pulmonary effort is normal. No respiratory distress.     Breath sounds: Normal breath sounds.     Comments: Left intercostal mild TTP. Abdominal:     General: Bowel sounds are normal.     Palpations: Abdomen is soft. There is no hepatomegaly, splenomegaly or mass.     Tenderness: There is no abdominal tenderness. There is no guarding or rebound.     Hernia: No hernia is present.  Skin:    General: Skin is warm and dry.     Coloration: Skin is not jaundiced.     Findings: No rash.  Neurological:  General: No focal deficit present.     Mental Status: She is alert  and oriented to person, place, and time.  Psychiatric:        Attention and Perception: Attention normal.        Mood and Affect: Mood normal.        Speech: Speech normal.        Behavior: Behavior normal.        Thought Content: Thought content normal.        Cognition and Memory: Cognition and memory normal.       07/01/2019 8:44 AM   Disclaimer: This note was dictated with voice recognition software. Similar sounding words can inadvertently be transcribed and may not be corrected upon review.

## 2019-07-01 NOTE — Patient Instructions (Signed)
Your health issues we discussed today were:   GERD (reflux/heartburn): 1. Continue taking Prilosec daily 2. Call us if you have any worsening or severe GERD symptoms  Constipation: 1. I am glad Lorraine Peters is working well for you 2. Continue MiraLAX at your current dosing regimen.  You can increase or decrease the frequency of MiraLAX depending on how you respond to it 3. Call us if you have any worsening or severe constipation or any diarrhea  "Chest wall pain": 1. As we discussed, I feel you likely have a musculoskeletal issue such as pulled muscle with heavy lifting of water bottles 2. Avoid heavy lifting for at least the next 2 to 4 weeks 3. Continue Tylenol as needed, heat or ice, gentle stretching and mobility 4. Call us if you have any worsening or severe symptoms  Overall I recommend:  1. Continue your other current medications 2. Return for follow-up in 4 months 3. Call us if you have any questions or concerns   At [facility] we value your feedback. You may receive a survey about your visit today. Please share your experience as we strive to create trusting relationships with our patients to provide genuine, compassionate, quality care.  We appreciate your understanding and patience as we review any laboratory studies, imaging, and other diagnostic tests that are ordered as we care for you. Our office policy is 5 business days for review of these results, and any emergent or urgent results are addressed in a timely manner for your best interest. If you do not hear from our office in 1 week, please contact us.   We also encourage the use of MyChart, which contains your medical information for your review as well. If you are not enrolled in this feature, an access code is on this after visit summary for your convenience. Thank you for allowing [place] to be involved in your care.  It was great to see you today!  I hope you have a great Summer!!

## 2019-07-06 DIAGNOSIS — J019 Acute sinusitis, unspecified: Secondary | ICD-10-CM | POA: Diagnosis not present

## 2019-07-20 ENCOUNTER — Ambulatory Visit: Payer: 59 | Admitting: Nurse Practitioner

## 2019-07-26 ENCOUNTER — Telehealth: Payer: Self-pay | Admitting: Internal Medicine

## 2019-07-26 MED FILL — OLMESARTAN MEDOXOMIL 40 MG: 40 | 90 days supply | Qty: 90 | Fill #0

## 2019-07-26 NOTE — Telephone Encounter (Signed)
Please call patient, was recently on an antibiotic and is having bad rectal itching

## 2019-07-26 NOTE — Telephone Encounter (Signed)
Routing to EG 

## 2019-07-26 NOTE — Telephone Encounter (Signed)
Spoke with pt. Pt was given an antibiotic for a sinus infection by Dr. Nevada Crane. Pt finished the antibiotic 10 or 11 days ago and pt isn't able to stop the rectal itching she has. Pt isn't sure if the itching and redness around her rectum. Pt had some old Nystatin cream at home that she has used and noticed some mild improvement. Please advise.

## 2019-07-27 MED FILL — LEVOCETIRIZINE 5 MG TABLET: 5 | 30 days supply | Qty: 30 | Fill #0

## 2019-07-28 NOTE — Telephone Encounter (Signed)
Spoke to pt. Pt will continue Nystatin cream if needed. Pt reports that the rectal rash/itching is doing a lot better and she will call back if not improving.

## 2019-07-28 NOTE — Telephone Encounter (Signed)
Continue the Nystatin if it is helping. Call us if no improvement and we can see if we can get her into the office for an evaluation.

## 2019-08-19 DIAGNOSIS — Z1283 Encounter for screening for malignant neoplasm of skin: Secondary | ICD-10-CM | POA: Diagnosis not present

## 2019-08-19 DIAGNOSIS — L905 Scar conditions and fibrosis of skin: Secondary | ICD-10-CM | POA: Diagnosis not present

## 2019-08-19 DIAGNOSIS — D225 Melanocytic nevi of trunk: Secondary | ICD-10-CM | POA: Diagnosis not present

## 2019-08-19 DIAGNOSIS — L708 Other acne: Secondary | ICD-10-CM | POA: Diagnosis not present

## 2019-08-20 ENCOUNTER — Encounter: Payer: Self-pay | Admitting: Gastroenterology

## 2019-08-20 ENCOUNTER — Ambulatory Visit (INDEPENDENT_AMBULATORY_CARE_PROVIDER_SITE_OTHER): Payer: 59 | Admitting: Gastroenterology

## 2019-08-20 ENCOUNTER — Other Ambulatory Visit (HOSPITAL_COMMUNITY)
Admission: RE | Admit: 2019-08-20 | Discharge: 2019-08-20 | Disposition: A | Discharge status: Home / Self Care | Payer: 59 | Source: Ambulatory Visit | Attending: Gastroenterology | Admitting: Gastroenterology

## 2019-08-20 ENCOUNTER — Other Ambulatory Visit: Payer: Self-pay

## 2019-08-20 DIAGNOSIS — K219 Gastro-esophageal reflux disease without esophagitis: Secondary | ICD-10-CM | POA: Insufficient documentation

## 2019-08-20 DIAGNOSIS — L290 Pruritus ani: Secondary | ICD-10-CM

## 2019-08-20 NOTE — Progress Notes (Signed)
Referring Provider: Celene Squibb, MD Primary Care Physician:  Celene Squibb, MD Primary GI: Dr. Gala Romney   Chief Complaint  Patient presents with  . Gastroesophageal Reflux  . Abdominal Pain  . Rash    rectum     HPI:   Lorraine Peters is a 49 y.o. female presenting today with a history of GERD, multiple adenomas with surveillance due in 2023, abdominal pain s/p EGD in July 2019. RUQ [place] July 2019 with fatty liver, no gallstones.   4 weeks ago felt like glass when having a BM. Takes Miralax every day. Devloped a rash between her buttocks. Felt like a ripping area in rectum when sitting. Not described as pain but more as stretching. Used Nystatin she had on hand. Now no pain with BMs but just feels irritated around it occasionally. No rectal bleeding, no purulent drainage. No soiling. Doesn't feel it every day.   Mild discomfort upper abdomen but not painful. Chronic at baseline.  GERD controlled with omeprazole once daily. Miralax working well for constipation. Sometimes feels full with eating and bloated. No nausea.   Past Medical History:  Diagnosis Date  . Breast cancer ([place])   . Family history of breast cancer   . Family history of colon cancer   . History of kidney stones   . Hypercholesterolemia   . Migraine   . Ovarian cyst   . Personal history of radiation therapy   . PONV (postoperative nausea and vomiting)   . Reflux     Past Surgical History:  Procedure Laterality Date  . BREAST LUMPECTOMY Left 2020  . COLONOSCOPY N/A 12/30/2018   six 4-7 mm polyps in rectum, descending colon, and ascending colon. Sigmoid and descending colon diverticulosis. Serrated and tubular adenoma. Surveillance 3 years.   . ESOPHAGOGASTRODUODENOSCOPY N/A 09/12/2017   normal esophagus, small hiatal hernia, large pedunculated duodenal polyp s/p resection and clip placement. Benign polyp  . PARTIAL MASTECTOMY WITH NEEDLE LOCALIZATION AND AXILLARY SENTINEL LYMPH NODE BX Left 04/29/2018    Procedure: PARTIAL MASTECTOMY WITH NEEDLE LOCALIZATION AND AXILLARY SENTINEL LYMPH NODE BX;  Surgeon: Virl Cagey, MD;  Location: [facility];  Service: General;  Laterality: Left;  . POLYPECTOMY  12/30/2018   Procedure: POLYPECTOMY;  Surgeon: Daneil Dolin, MD;  Location: [facility] SUITE;  Service: Endoscopy;;  ascending,descending,rectal  . TUBAL LIGATION      Current Outpatient Medications  Medication Sig Dispense Refill  . atorvastatin (LIPITOR) 20 MG tablet TAKE 1 Tablet BY MOUTH ONCE DAILY (Patient taking differently: Take 10 mg by mouth at bedtime. ) 90 tablet 2  . CALCIUM PO Take 2,400 mg by mouth daily.    . Cinnamon 500 MG capsule Take 500 mg by mouth daily.    . fexofenadine (ALLEGRA) 180 MG tablet Take 180 mg by mouth at bedtime.    . hydrochlorothiazide (HYDRODIURIL) 25 MG tablet Take 25 mg by mouth daily.    . hydrOXYzine (VISTARIL) 25 MG capsule Take 25 mg by mouth as needed for anxiety.     . Magnesium 250 MG TABS Take 250 mg by mouth as needed (leg cramps).     . meclizine (ANTIVERT) 25 MG tablet Take 1 tablet (25 mg total) by mouth 3 (three) times daily as needed for dizziness. 30 tablet 0  . nystatin-triamcinolone (MYCOLOG II) cream Apply 1 application topically as needed.     Marland Kitchen olmesartan (BENICAR) 40 MG tablet Take 40 mg by mouth at bedtime.     Marland Kitchen  omeprazole (PRILOSEC) 40 MG capsule Take 1 capsule (40 mg total) by mouth in the morning and at bedtime. 60 capsule 3  . polyethylene glycol (MIRALAX / GLYCOLAX) 17 g packet Take 17 g by mouth daily.     . tamoxifen (NOLVADEX) 20 MG tablet TAKE 1 TABLET BY MOUTH DAILY. (Patient taking differently: Take 20 mg by mouth daily. ) 30 tablet 5   No current facility-administered medications for this visit.    Allergies as of 08/20/2019 - Review Complete 08/20/2019  Allergen Reaction Noted  . Bee venom Swelling and Other (See Comments) 04/29/2011    Family History  Problem Relation Age of Onset  . Arthritis Mother   . Cancer  Mother        breast  . Breast cancer Mother 25       recurrance at 61  . COPD Father   . Heart disease Father   . Hyperlipidemia Father   . Hypertension Father   . Cancer Sister 15       anal, deceased due to mva but had advanced disease  . Stroke Maternal Grandfather   . Breast cancer Maternal Aunt 58  . Breast cancer Maternal Uncle        colon    Social History   Socioeconomic History  . Marital status: Divorced    Spouse name: Not on file  . Number of children: 1  . Years of education: Not on file  . Highest education level: Not on file  Occupational History  . Not on file  Tobacco Use  . Smoking status: Former Smoker    Packs/day: 0.50    Years: 28.00    Pack years: 14.00    Types: Cigarettes    Quit date: 04/22/2018    Years since quitting: 1.3  . Smokeless tobacco: Never Used  Vaping Use  . Vaping Use: Never used  Substance and Sexual Activity  . Alcohol use: No    Alcohol/week: 0.0 standard drinks  . Drug use: No  . Sexual activity: Not Currently    Birth control/protection: Surgical    Comment: tubal  Other Topics Concern  . Not on file  Social History Narrative  . Not on file   Social Determinants of Health   Financial Resource Strain:   . Difficulty of Paying Living Expenses:   Food Insecurity:   . Worried About Charity fundraiser in the Last Year:   . Arboriculturist in the Last Year:   Transportation Needs:   . Film/video editor (Medical):   Marland Kitchen Lack of Transportation (Non-Medical):   Physical Activity:   . Days of Exercise per Week:   . Minutes of Exercise per Session:   Stress:   . Feeling of Stress :   Social Connections:   . Frequency of Communication with Friends and Family:   . Frequency of Social Gatherings with Friends and Family:   . Attends Religious Services:   . Active Member of Clubs or Organizations:   . Attends Archivist Meetings:   Marland Kitchen Marital Status:     Review of Systems: Gen: Denies fever, chills,  anorexia. Denies fatigue, weakness, weight loss.  CV: Denies chest pain, palpitations, syncope, peripheral edema, and claudication. Resp: Denies dyspnea at rest, cough, wheezing, coughing up blood, and pleurisy. GI: see hPI Derm: Denies rash, itching, dry skin Psych: Denies depression, anxiety, memory loss, confusion. No homicidal or suicidal ideation.  Heme: Denies bruising, bleeding, and enlarged lymph nodes.  Physical Exam: BP 117/77   Pulse 77   Temp (!) 97.3 F (36.3 C) (Oral)   Ht 5\' 8"  (1.727 m)   Wt 181 lb 6.4 oz (82.3 kg)   BMI 27.58 kg/m  General:   Alert and oriented. No distress noted. Pleasant and cooperative.  Head:  Normocephalic and atraumatic. Eyes:  Conjuctiva clear without scleral icterus. Mouth:  Mask in place Abdomen:  +BS, soft, non-tender and non-distended. No rebound or guarding. No HSM or masses noted. Rectal: no obvious rash, no external hemorrhoids. No obvious fissure. Deferred DRE.  Msk:  Symmetrical without gross deformities. Normal posture. Extremities:  Without edema. Neurologic:  Alert and  oriented x4 Psych:  Alert and cooperative. Normal mood and affect.  ASSESSMENT: Lorraine Peters is a 49 y.o. female presenting today with a history of GERD, constipation, now with reports of rectal pain and itching.   Rectal pain: likely had small tear with BM that resolved with supportive measures. No persistent symptoms. Will hold off on treatment for fissure unless recurrent symptoms.  Rectal itching: peri-rectally and describes rash that extended up bottocks. Some response to nystatin but with occasional peri-rectal itching. I have asked her to use clotrimazole as needed. No obvious abnormalities on exam.   GERD: well-controlled with PPI. Chronic bloating, fullness s/p eating. EGD on file. [place] without gallstones. Check celiac serologies for completeness' sake. No concerning symptoms.    PLAN:  Clotrimazole peri-rectally as needed  Continue PPI daily  Call  if no improvement or recurrence  Celiac labs  Return in 6 months  Colonoscopy in 2023  Annitta Needs, PhD, ANP-BC [facility]

## 2019-08-20 NOTE — Patient Instructions (Signed)
You can use clotrimazole over the counter for any itching around your rectum. Continue to keep this dry as you are doing. Call if no improvement.   I have ordered labs to check for celiac disease.   We will see you in 6 months or sooner if needed!  It was a pleasure to see you today. I want to create trusting relationships with patients to provide genuine, compassionate, and quality care. I value your feedback. If you receive a survey regarding your visit,  I greatly appreciate you taking time to fill this out.   Annitta Needs, PhD, ANP-BC [facility]

## 2019-08-21 LAB — IGA: IgA: 223 mg/dL (ref 87–352)

## 2019-08-23 MED FILL — TAMOXIFEN 20 MG TABLET: 20 | 30 days supply | Qty: 30 | Fill #0

## 2019-08-23 NOTE — Progress Notes (Signed)
Cc'ed to pcp °

## 2019-08-24 MED FILL — LEVOCETIRIZINE 5 MG TABLET: 5 | 30 days supply | Qty: 30 | Fill #1

## 2019-08-25 ENCOUNTER — Other Ambulatory Visit (HOSPITAL_COMMUNITY)
Admission: RE | Admit: 2019-08-25 | Discharge: 2019-08-25 | Disposition: A | Discharge status: Home / Self Care | Payer: 59 | Source: Ambulatory Visit | Attending: Gastroenterology | Admitting: Gastroenterology

## 2019-08-25 DIAGNOSIS — K219 Gastro-esophageal reflux disease without esophagitis: Secondary | ICD-10-CM | POA: Insufficient documentation

## 2019-08-26 LAB — IGA: IgA: 226 mg/dL (ref 87–352)

## 2019-08-26 LAB — TISSUE TRANSGLUTAMINASE, IGA: Tissue Transglutaminase Ab, IgA: <2 U/mL (ref 0–3)

## 2019-09-24 DIAGNOSIS — R7301 Impaired fasting glucose: Secondary | ICD-10-CM | POA: Diagnosis not present

## 2019-09-24 DIAGNOSIS — I10 Essential (primary) hypertension: Secondary | ICD-10-CM | POA: Diagnosis not present

## 2019-09-24 DIAGNOSIS — E782 Mixed hyperlipidemia: Secondary | ICD-10-CM | POA: Diagnosis not present

## 2019-09-27 MED FILL — TAMOXIFEN 20 MG TABLET: 20 | 30 days supply | Qty: 30 | Fill #1

## 2019-09-27 MED FILL — LEVOCETIRIZINE 5 MG TABLET: 5 | 30 days supply | Qty: 30 | Fill #2

## 2019-09-27 MED FILL — OMEPRAZOLE 40 MG CPDR: 40 | 30 days supply | Qty: 60 | Fill #2

## 2019-09-28 ENCOUNTER — Ambulatory Visit (INDEPENDENT_AMBULATORY_CARE_PROVIDER_SITE_OTHER): Payer: 59 | Admitting: Adult Health

## 2019-09-28 ENCOUNTER — Encounter: Payer: Self-pay | Admitting: Adult Health

## 2019-09-28 ENCOUNTER — Other Ambulatory Visit (HOSPITAL_COMMUNITY)
Admission: RE | Admit: 2019-09-28 | Discharge: 2019-09-28 | Disposition: A | Discharge status: Home / Self Care | Payer: 59 | Source: Ambulatory Visit | Attending: Adult Health | Admitting: Adult Health

## 2019-09-28 VITALS — BP 118/73 | HR 80 | Ht 66.25 in | Wt 183.6 lb

## 2019-09-28 DIAGNOSIS — Z01419 Encounter for gynecological examination (general) (routine) without abnormal findings: Secondary | ICD-10-CM | POA: Insufficient documentation

## 2019-09-28 DIAGNOSIS — Z79810 Long term (current) use of selective estrogen receptor modulators (SERMs): Secondary | ICD-10-CM | POA: Diagnosis not present

## 2019-09-28 DIAGNOSIS — Z853 Personal history of malignant neoplasm of breast: Secondary | ICD-10-CM | POA: Insufficient documentation

## 2019-09-28 DIAGNOSIS — Z1211 Encounter for screening for malignant neoplasm of colon: Secondary | ICD-10-CM | POA: Insufficient documentation

## 2019-09-28 DIAGNOSIS — Z923 Personal history of irradiation: Secondary | ICD-10-CM | POA: Diagnosis not present

## 2019-09-28 LAB — HEMOCCULT GUIAC POC 1CARD (OFFICE): Fecal Occult Blood, POC: NEGATIVE

## 2019-09-28 NOTE — Progress Notes (Signed)
Patient ID: Lorraine Peters, female   DOB: Aug 14, 1970, 49 y.o.   MRN: [id number] History of Present Illness: Lorraine Peters is a 49 year old white female,divorced, G1P0101, in for a well woman gyn exam and pap.She had breast cancer diagnosed at 9 and is on tamoxifen and had radiation. She works at [employer]. PCP is Dr Nevada Crane.  Current Medications, Allergies, Past Medical History, Past Surgical History, Family History and Social History were reviewed in [employer] record.     Review of Systems: Patient denies any headaches, hearing loss, fatigue, blurred vision, shortness of breath, chest pain, abdominal pain, problems with bowel movements, urination, or intercourse(not active). No joint pain or mood swings. Some hot flashes and night sweats on and off.   Physical Exam:BP 118/73 (BP Location: Right Arm, Patient Position: Sitting, Cuff Size: Normal)   Pulse 80   Ht 5' 6.25" (1.683 m)   Wt 183 lb 9.6 oz (83.3 kg)   BMI 29.41 kg/m  General:  Well developed, well nourished, no acute distress Skin:  Warm and dry Neck:  Midline trachea, normal thyroid, good ROM, no lymphadenopathy Lungs; Clear to auscultation bilaterally Breast:  No dominant palpable mass, retraction, or nipple discharge,left breast has color changes and is thick feeling from the radiation.  Cardiovascular: Regular rate and rhythm Abdomen:  Soft, non tender, no hepatosplenomegaly Pelvic:  External genitalia is normal in appearance, no lesions.  The vagina is normal in appearance. Urethra has no lesions or masses. The cervix is smooth,pap with high risk HPV 16/81 genotyping performed.  Uterus is felt to be normal size, shape, and contour.  No adnexal masses or tenderness noted.Bladder is non tender, no masses felt. Rectal: Good sphincter tone, no polyps, or hemorrhoids felt.  Hemoccult negative. Extremities/musculoskeletal:  No swelling or varicosities noted, no clubbing or cyanosis Psych:  No mood  changes, alert and cooperative,seems happy AA is 0 Fall risk is low PHQ 9 score is 0 Examination chaperoned by Celene Squibb LPN  Impression and Plan: 1. Encounter for gynecological examination with Papanicolaou smear of cervix Pap sent Physical  And [pap in 1 year Labs with PCP  Mammogram yearly   2. Encounter for screening fecal occult blood testing Colonoscopy per GI  3. Personal history of breast cancer Mammogram yearly

## 2019-09-29 DIAGNOSIS — E782 Mixed hyperlipidemia: Secondary | ICD-10-CM | POA: Diagnosis not present

## 2019-09-29 DIAGNOSIS — R7301 Impaired fasting glucose: Secondary | ICD-10-CM | POA: Diagnosis not present

## 2019-09-29 DIAGNOSIS — Z853 Personal history of malignant neoplasm of breast: Secondary | ICD-10-CM | POA: Diagnosis not present

## 2019-09-29 DIAGNOSIS — I10 Essential (primary) hypertension: Secondary | ICD-10-CM | POA: Diagnosis not present

## 2019-09-29 DIAGNOSIS — D0512 Intraductal carcinoma in situ of left breast: Secondary | ICD-10-CM | POA: Diagnosis not present

## 2019-09-29 DIAGNOSIS — Z6828 Body mass index (BMI) 28.0-28.9, adult: Secondary | ICD-10-CM | POA: Diagnosis not present

## 2019-09-29 DIAGNOSIS — F411 Generalized anxiety disorder: Secondary | ICD-10-CM | POA: Diagnosis not present

## 2019-09-29 DIAGNOSIS — E669 Obesity, unspecified: Secondary | ICD-10-CM | POA: Diagnosis not present

## 2019-09-29 DIAGNOSIS — Z0001 Encounter for general adult medical examination with abnormal findings: Secondary | ICD-10-CM | POA: Diagnosis not present

## 2019-09-30 LAB — CYTOLOGY - PAP
Comment: NEGATIVE
Diagnosis: NEGATIVE
High risk HPV: NEGATIVE

## 2019-10-14 ENCOUNTER — Other Ambulatory Visit (HOSPITAL_COMMUNITY)
Admission: RE | Admit: 2019-10-14 | Discharge: 2019-10-14 | Disposition: A | Discharge status: Home / Self Care | Payer: 59 | Source: Ambulatory Visit | Attending: Hematology | Admitting: Hematology

## 2019-10-14 DIAGNOSIS — D0512 Intraductal carcinoma in situ of left breast: Secondary | ICD-10-CM | POA: Insufficient documentation

## 2019-10-14 LAB — CBC WITH DIFFERENTIAL/PLATELET
Abs Immature Granulocytes: 0.02 10*3/uL (ref 0.00–0.07)
Basophils Absolute: 0 10*3/uL (ref 0.0–0.1)
Basophils Relative: 0 %
Eosinophils Absolute: 0.1 10*3/uL (ref 0.0–0.5)
Eosinophils Relative: 1 %
HCT: 39.3 % (ref 36.0–46.0)
Hemoglobin: 12.6 g/dL (ref 12.0–15.0)
Immature Granulocytes: 0 %
Lymphocytes Relative: 27 %
Lymphs Abs: 2.3 10*3/uL (ref 0.7–4.0)
MCH: 28.5 pg (ref 26.0–34.0)
MCHC: 32.1 g/dL (ref 30.0–36.0)
MCV: 88.9 fL (ref 80.0–100.0)
Monocytes Absolute: 0.6 10*3/uL (ref 0.1–1.0)
Monocytes Relative: 7 %
Neutro Abs: 5.5 10*3/uL (ref 1.7–7.7)
Neutrophils Relative %: 65 %
Platelets: 228 10*3/uL (ref 150–400)
RBC: 4.42 MIL/uL (ref 3.87–5.11)
RDW: 14.1 % (ref 11.5–15.5)
WBC: 8.4 10*3/uL (ref 4.0–10.5)
nRBC: 0 % (ref 0.0–0.2)

## 2019-10-14 LAB — COMPREHENSIVE METABOLIC PANEL
ALT: 14 U/L (ref 0–44)
AST: 14 U/L — ABNORMAL LOW (ref 15–41)
Albumin: 3.7 g/dL (ref 3.5–5.0)
Alkaline Phosphatase: 48 U/L (ref 38–126)
Anion gap: 9 (ref 5–15)
BUN: 15 mg/dL (ref 6–20)
CO2: 22 mmol/L (ref 22–32)
Calcium: 8.8 mg/dL — ABNORMAL LOW (ref 8.9–10.3)
Chloride: 106 mmol/L (ref 98–111)
Creatinine, Ser: 0.82 mg/dL (ref 0.44–1.00)
GFR calc Af Amer: >60 mL/min (ref >60)
GFR calc non Af Amer: >60 mL/min (ref >60)
Glucose, Bld: 102 mg/dL — ABNORMAL HIGH (ref 70–99)
Potassium: 3.8 mmol/L (ref 3.5–5.1)
Sodium: 137 mmol/L (ref 135–145)
Total Bilirubin: 0.3 mg/dL (ref 0.3–1.2)
Total Protein: 6.5 g/dL (ref 6.5–8.1)

## 2019-10-14 LAB — VITAMIN D 25 HYDROXY (VIT D DEFICIENCY, FRACTURES): Vit D, 25-Hydroxy: 38.42 ng/mL (ref 30–100)

## 2019-10-15 ENCOUNTER — Other Ambulatory Visit: Payer: Self-pay | Admitting: Hematology

## 2019-10-15 ENCOUNTER — Other Ambulatory Visit: Payer: Self-pay

## 2019-10-15 ENCOUNTER — Ambulatory Visit
Admission: RE | Admit: 2019-10-15 | Discharge: 2019-10-15 | Disposition: A | Discharge status: Home / Self Care | Payer: 59 | Source: Ambulatory Visit | Attending: Hematology | Admitting: Hematology

## 2019-10-15 DIAGNOSIS — Z853 Personal history of malignant neoplasm of breast: Secondary | ICD-10-CM

## 2019-10-15 DIAGNOSIS — R921 Mammographic calcification found on diagnostic imaging of breast: Secondary | ICD-10-CM

## 2019-10-18 MED FILL — HYDROCHLOROTHIAZIDE 25 MG T: 25 | 90 days supply | Qty: 90 | Fill #3

## 2019-10-18 MED FILL — OLMESARTAN MEDOXOMIL 40 MG: 40 | 90 days supply | Qty: 90 | Fill #1

## 2019-10-20 ENCOUNTER — Inpatient Hospital Stay (HOSPITAL_COMMUNITY): Payer: 59 | Attending: Hematology | Admitting: Hematology

## 2019-10-20 ENCOUNTER — Encounter (HOSPITAL_COMMUNITY): Payer: Self-pay | Admitting: Emergency Medicine

## 2019-10-20 ENCOUNTER — Other Ambulatory Visit (HOSPITAL_COMMUNITY): Payer: 59

## 2019-10-20 ENCOUNTER — Other Ambulatory Visit: Payer: Self-pay

## 2019-10-20 ENCOUNTER — Emergency Department (HOSPITAL_COMMUNITY): Payer: PRIVATE HEALTH INSURANCE

## 2019-10-20 ENCOUNTER — Emergency Department (HOSPITAL_COMMUNITY)
Admission: EM | Admit: 2019-10-20 | Discharge: 2019-10-20 | Disposition: A | Discharge status: Home / Self Care | Payer: PRIVATE HEALTH INSURANCE | Attending: Emergency Medicine | Admitting: Emergency Medicine

## 2019-10-20 VITALS — BP 109/65 | HR 73 | Temp 97.3°F | Resp 17 | Wt 183.6 lb

## 2019-10-20 DIAGNOSIS — Y998 Other external cause status: Secondary | ICD-10-CM | POA: Insufficient documentation

## 2019-10-20 DIAGNOSIS — Y9301 Activity, walking, marching and hiking: Secondary | ICD-10-CM | POA: Insufficient documentation

## 2019-10-20 DIAGNOSIS — Z79810 Long term (current) use of selective estrogen receptor modulators (SERMs): Secondary | ICD-10-CM | POA: Diagnosis not present

## 2019-10-20 DIAGNOSIS — M25551 Pain in right hip: Secondary | ICD-10-CM | POA: Insufficient documentation

## 2019-10-20 DIAGNOSIS — Z853 Personal history of malignant neoplasm of breast: Secondary | ICD-10-CM | POA: Diagnosis not present

## 2019-10-20 DIAGNOSIS — Z87891 Personal history of nicotine dependence: Secondary | ICD-10-CM | POA: Insufficient documentation

## 2019-10-20 DIAGNOSIS — D0512 Intraductal carcinoma in situ of left breast: Secondary | ICD-10-CM | POA: Insufficient documentation

## 2019-10-20 DIAGNOSIS — Z923 Personal history of irradiation: Secondary | ICD-10-CM | POA: Diagnosis not present

## 2019-10-20 DIAGNOSIS — Z803 Family history of malignant neoplasm of breast: Secondary | ICD-10-CM | POA: Diagnosis not present

## 2019-10-20 DIAGNOSIS — Z79899 Other long term (current) drug therapy: Secondary | ICD-10-CM | POA: Insufficient documentation

## 2019-10-20 DIAGNOSIS — Y92410 Unspecified street and highway as the place of occurrence of the external cause: Secondary | ICD-10-CM | POA: Diagnosis not present

## 2019-10-20 DIAGNOSIS — Z801 Family history of malignant neoplasm of trachea, bronchus and lung: Secondary | ICD-10-CM | POA: Insufficient documentation

## 2019-10-20 DIAGNOSIS — I10 Essential (primary) hypertension: Secondary | ICD-10-CM | POA: Insufficient documentation

## 2019-10-20 DIAGNOSIS — S79911A Unspecified injury of right hip, initial encounter: Secondary | ICD-10-CM | POA: Diagnosis not present

## 2019-10-20 LAB — URINALYSIS, ROUTINE W REFLEX MICROSCOPIC
Bilirubin Urine: NEGATIVE
Glucose, UA: NEGATIVE mg/dL
Hgb urine dipstick: NEGATIVE
Ketones, ur: NEGATIVE mg/dL
Leukocytes,Ua: NEGATIVE
Nitrite: NEGATIVE
Protein, ur: NEGATIVE mg/dL
Specific Gravity, Urine: 1.017 (ref 1.005–1.030)
pH: 5 (ref 5.0–8.0)

## 2019-10-20 MED ORDER — ACETAMINOPHEN 325 MG PO TABS
650.0000 mg | ORAL_TABLET | Freq: Once | ORAL | Status: AC
  Administered 2019-10-20: 650 mg via ORAL
  Filled 2019-10-20: qty 2

## 2019-10-20 MED ORDER — LIDOCAINE 5 % EX PTCH
1.0000 | MEDICATED_PATCH | Freq: Once | CUTANEOUS | Status: DC
  Administered 2019-10-20: 1 via TRANSDERMAL
  Filled 2019-10-20: qty 1

## 2019-10-20 MED ORDER — ALPRAZOLAM 0.25 MG PO TABS: 0.2500 mg | ORAL_TABLET | Freq: Every evening | ORAL | 2 refills | Status: DC | PRN

## 2019-10-20 MED FILL — ALPRAZolam 0.25 MG TABS: 0.25 | 30 days supply | Qty: 30 | Fill #0
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
## 2019-10-20 NOTE — Progress Notes (Signed)
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  The Common Hereditary Gene Panel offered by [facility] includes sequencing and/or deletion duplication testing of the following 48 genes: APC, ATM, AXIN2, BARD1, BMPR1A, BRCA1, BRCA2, BRIP1, CDH1, CDK4, CDKN2A (p14ARF), CDKN2A (p16INK4a), CHEK2, CTNNA1, DICER1, EPCAM (Deletion/duplication testing only), GREM1 (promoter region deletion/duplication testing only), KIT, MEN1, MLH1, MSH2, MSH3, MSH6, MUTYH, NBN, NF1, NHTL1, PALB2, PDGFRA, PMS2, POLD1, POLE, PTEN, RAD50, RAD51C, RAD51D, RNF43, SDHB, SDHC, SDHD, SMAD4, SMARCA4. STK11, TP53, TSC1, TSC2, and VHL.  The following genes were evaluated for sequence changes only: SDHA and HOXB13 c.251G>A variant only. The report date is October 30, 2018.     CHIEF COMPLIANT: left breast high-grade DCIS   INTERVAL HISTORY: [name] is a 49 y.o. female here today for follow up of her left breast high-grade DCIS. Her last visit was on 04/22/2019.  Today she reports that she is tolerating the tamoxifen well. She reports occasional vaginal discharge, but gets annual Pap smears. She reports having sleeping issues from anxiety and is inquiring about medical marijuana or melatonin to help her sleep.  She has not received her COVID vaccine, but is curious about it now. She is scheduled for a bilat mammogram on 04/07/2020.   REVIEW OF SYSTEMS:   Review of Systems  Constitutional: Positive for appetite change (mildly decreased) and fatigue  (mild).  Genitourinary: Negative for vaginal discharge.   Skin: Negative for rash.  Neurological: Positive for dizziness (vertigo).  Psychiatric/Behavioral: Positive for sleep disturbance. The patient is nervous/anxious.   All other systems reviewed and are negative.   I have reviewed the past medical history, past surgical history, social history and family history with the patient and they are unchanged from previous note.   ALLERGIES:   is allergic to bee venom.   MEDICATIONS:  Current Outpatient Medications  Medication Sig Dispense Refill  . atorvastatin (LIPITOR) 20 MG tablet TAKE 1 Tablet BY MOUTH ONCE DAILY (Patient taking differently: Take 10 mg by mouth at bedtime. ) 90 tablet 2  . CALCIUM PO Take 2,400 mg by mouth daily.    . Cinnamon 500 MG capsule Take 500 mg by mouth daily.    . fexofenadine (ALLEGRA) 180 MG tablet Take 180 mg by mouth at bedtime.    . hydrochlorothiazide (HYDRODIURIL) 25 MG tablet Take 25 mg by mouth daily.    . hydrOXYzine (VISTARIL) 25 MG capsule Take 25 mg by mouth as needed for anxiety.     . meclizine (ANTIVERT) 25 MG tablet Take 1 tablet (25 mg total) by mouth 3 (three) times daily as needed for dizziness. 30 tablet 0  . nystatin-triamcinolone ([place]COL[place] II) cream Apply 1 application topically as needed.     Marland Kitchen olmesartan (BENICAR) 40 MG tablet Take 40 mg by mouth at bedtime.     Marland Kitchen omeprazole (PRILOSEC) 40 MG capsule Take 1 capsule (40 mg total) by mouth in the morning and at bedtime. 60 capsule 3  . polyethylene glycol (MIRALAX / GLYCOLAX) 17 g packet Take 17 g by mouth daily.     Marland Kitchen  tamoxifen (NOLVADEX) 20 MG tablet TAKE 1 TABLET BY MOUTH DAILY. (Patient taking differently: Take 20 mg by mouth daily. ) 30 tablet 5   No current facility-administered medications for this visit.     PHYSICAL EXAMINATION: Performance status (ECOG): 0 - Asymptomatic  Vitals:   10/20/19 1031  BP: 109/65  Pulse: 73  Resp: 17  Temp: (!) 97.3 F (36.3 C)  SpO2:  99%   Wt Readings from Last 3 Encounters:  10/20/19 183 lb 9.6 oz (83.3 kg)  09/28/19 183 lb 9.6 oz (83.3 kg)  08/20/19 181 lb 6.4 oz (82.3 kg)   Physical Exam Vitals reviewed.  Constitutional:      Appearance: Normal appearance.  Cardiovascular:     Rate and Rhythm: Normal rate and regular rhythm.     Pulses: Normal pulses.     Heart sounds: Normal heart sounds.  Pulmonary:     Effort: Pulmonary effort is normal.     Breath sounds: Normal breath sounds.  Chest:     Breasts:        Left: Skin change (LUOQ scar changes) present.  Abdominal:     Palpations: Abdomen is soft. There is no hepatomegaly, splenomegaly or mass.     Tenderness: There is no abdominal tenderness.  Musculoskeletal:     Right lower leg: No edema.     Left lower leg: No edema.  Lymphadenopathy:     Cervical: No cervical adenopathy.     Upper Body:     Right upper body: No supraclavicular, axillary or pectoral adenopathy.     Left upper body: No supraclavicular, axillary or pectoral adenopathy.  Neurological:     General: No focal deficit present.     Mental Status: She is alert and oriented to person, place, and time.  Psychiatric:        Mood and Affect: Mood normal.        Behavior: Behavior normal.     Breast Exam Chaperone: Milinda Antis, MD     LABORATORY DATA:  I have reviewed the data as listed CMP Latest Ref Rng & Units 10/14/2019 06/29/2019 04/06/2019  Glucose 70 - 99 mg/dL 102(H) 102(H) 107(H)  BUN 6 - 20 mg/dL 15 16 13   Creatinine 0.44 - 1.00 mg/dL 0.82 0.54 0.51  Sodium 135 - 145 mmol/L 137 137 139  Potassium 3.5 - 5.1 mmol/L 3.8 3.3(L) 3.2(L)  Chloride 98 - 111 mmol/L 106 103 102  CO2 22 - 32 mmol/L 22 20(L) 27  Calcium 8.9 - 10.3 mg/dL 8.8(L) 9.3 8.8(L)  Total Protein 6.5 - 8.1 g/dL 6.5 7.2 6.1(L)  Total Bilirubin 0.3 - 1.2 mg/dL 0.3 0.4 0.3  Alkaline Phos 38 - 126 U/L 48 50 44  AST 15 - 41 U/L 14(L) 18 15  ALT 0 - 44 U/L 14 18 19    No results found for: HAL937 Lab Results    Component Value Date   WBC 8.4 10/14/2019   HGB 12.6 10/14/2019   HCT 39.3 10/14/2019   MCV 88.9 10/14/2019   PLT 228 10/14/2019   NEUTROABS 5.5 10/14/2019   Lab Results  Component Value Date   VD25OH 38.42 10/14/2019   VD25OH 36.53 04/06/2019   VD25OH 40.5 11/13/2018    ASSESSMENT:  1.  Left breast high-grade DCIS: -Lumpectomy on 04/29/2018, 0.5 cm high-grade DCIS, ER 70%, PR 80%, margins negative, 0/1 lymph node positive, pTis PN 0. -She received radiation therapy. -Tamoxifen started on 05/12/2018. -Bilateral mammogram on 04/06/2019 showed left breast calcifications. -Left upper  outer quadrant needle biopsy on 04/15/2018 shows benign breast tissue with calcifications.  2.  Mild hypoproteinemia: -She has mild hypoalbuminemia dating back several years.  She also has mild leg swellings. -She was instructed to eat high-protein diet.  3.  Family history: -Mother had breast and lung cancer.  Maternal aunt had breast cancer.  Maternal uncle had colon cancer.  Sister had anal cancer. -Genetic testing was negative.   PLAN:  1.  Left breast high-grade DCIS: -Reviewed mammogram results from 10/15/2019.  She is tolerating tamoxifen reasonably well. -She was encouraged to continue yearly Pap smears while taking tamoxifen. -LFTs are within normal limits.  CBC was normal. -We will see her back in 6 months for follow-up.  I plan to repeat mammograms.  2.  Mild hypoproteinemia: -This has resolved.  Albumin is 3.7.  3.  Anxiety: -She reports anxiety episodes at nighttime and being unable to sleep. -I will start her on Xanax 0.25 mg at bedtime as needed.   Breast Cancer therapy associated bone loss: I have recommended calcium, Vitamin D and weight bearing exercises.  No orders of the defined types were placed in this encounter.  The patient has a good understanding of the overall plan. she agrees with it. she will call with any problems that may develop before the next visit  here.    Lorraine Jack, MD [place] [phone]   I, Milinda Antis, am acting as a scribe for Dr. Sanda Linger.  I, Lorraine Jack MD, have reviewed the above documentation for accuracy and completeness, and I agree with the above.

## 2019-10-20 NOTE — Patient Instructions (Signed)
[place] at [facility] Discharge Instructions  You were seen today by Dr. Delton Coombes. He went over your recent results. You can proceed with your COVID vaccine. You will be prescribed alprazolam to take at bedtime. Dr. Delton Coombes will see you back in 6 months for labs and follow up.   Thank you for choosing [place] at [facility] to provide your oncology and hematology care.  To afford each patient quality time with our provider, please arrive at least 15 minutes before your scheduled appointment time.   If you have a lab appointment with the [place] please come in thru the Main Entrance and check in at the main information desk  You need to re-schedule your appointment should you arrive 10 or more minutes late.  We strive to give you quality time with our providers, and arriving late affects you and other patients whose appointments are after yours.  Also, if you no show three or more times for appointments you may be dismissed from the clinic at the providers discretion.     Again, thank you for choosing [facility].  Our hope is that these requests will decrease the amount of time that you wait before being seen by our physicians.       _____________________________________________________________  Should you have questions after your visit to [facility], please contact our office at [phone] between the hours of 8:00 a.m. and 4:30 p.m.  Voicemails left after 4:00 p.m. will not be returned until the following business day.  For prescription refill requests, have your pharmacy contact our office and allow 72 hours.    Cancer Center Support Programs:   > Cancer Support Group  2nd Tuesday of the month 1pm-2pm, Journey Room

## 2019-10-27 DIAGNOSIS — D0512 Intraductal carcinoma in situ of left breast: Secondary | ICD-10-CM | POA: Diagnosis not present

## 2019-10-27 DIAGNOSIS — Z170 Estrogen receptor positive status [ER+]: Secondary | ICD-10-CM | POA: Diagnosis not present

## 2019-10-27 MED FILL — LEVOCETIRIZINE 5 MG TABLET: 5 | 30 days supply | Qty: 30 | Fill #3

## 2019-10-27 MED FILL — TAMOXIFEN 20 MG TABLET: 20 | 30 days supply | Qty: 30 | Fill #2

## 2019-11-02 ENCOUNTER — Ambulatory Visit: Payer: 59 | Admitting: Nurse Practitioner

## 2019-11-02 MED FILL — FLUTICASONE PROP 50 MCG SPR: 50 | 60 days supply | Qty: 16 | Fill #0

## 2019-11-24 MED FILL — OMEPRAZOLE 40 MG CPDR: 40 | 30 days supply | Qty: 60 | Fill #3

## 2019-11-24 MED FILL — LEVOCETIRIZINE 5 MG TABLET: 5 | 30 days supply | Qty: 30 | Fill #4

## 2019-11-24 MED FILL — TAMOXIFEN 20 MG TABLET: 20 | 30 days supply | Qty: 30 | Fill #0

## 2019-11-30 MED FILL — ATORVASTATIN 20 MG TABLET: 20 | 90 days supply | Qty: 90 | Fill #0

## 2019-12-25 DIAGNOSIS — H9311 Tinnitus, right ear: Secondary | ICD-10-CM | POA: Diagnosis not present

## 2019-12-25 DIAGNOSIS — H9201 Otalgia, right ear: Secondary | ICD-10-CM | POA: Diagnosis not present

## 2019-12-29 ENCOUNTER — Other Ambulatory Visit (HOSPITAL_COMMUNITY): Payer: Self-pay | Admitting: Hematology

## 2019-12-29 ENCOUNTER — Other Ambulatory Visit (HOSPITAL_COMMUNITY): Payer: Self-pay | Admitting: *Deleted

## 2019-12-29 DIAGNOSIS — D0512 Intraductal carcinoma in situ of left breast: Secondary | ICD-10-CM

## 2019-12-29 MED ORDER — TAMOXIFEN CITRATE 20 MG PO TABS: 20.0000 mg | ORAL_TABLET | Freq: Every day | ORAL | 5 refills | Status: DC

## 2019-12-29 MED FILL — MECLIZINE 25 MG TABLET: 25 | 11 days supply | Qty: 45 | Fill #0

## 2019-12-29 MED FILL — TAMOXIFEN 20 MG TABLET: 20 | 30 days supply | Qty: 30 | Fill #0

## 2020-01-13 DIAGNOSIS — Z6829 Body mass index (BMI) 29.0-29.9, adult: Secondary | ICD-10-CM | POA: Diagnosis not present

## 2020-01-13 DIAGNOSIS — Z712 Person consulting for explanation of examination or test findings: Secondary | ICD-10-CM | POA: Diagnosis not present

## 2020-01-13 DIAGNOSIS — H6990 Unspecified Eustachian tube disorder, unspecified ear: Secondary | ICD-10-CM | POA: Diagnosis not present

## 2020-01-13 DIAGNOSIS — R1010 Upper abdominal pain, unspecified: Secondary | ICD-10-CM | POA: Diagnosis not present

## 2020-01-13 DIAGNOSIS — R252 Cramp and spasm: Secondary | ICD-10-CM | POA: Diagnosis not present

## 2020-01-13 DIAGNOSIS — F411 Generalized anxiety disorder: Secondary | ICD-10-CM | POA: Diagnosis not present

## 2020-01-13 DIAGNOSIS — R110 Nausea: Secondary | ICD-10-CM | POA: Diagnosis not present

## 2020-01-13 DIAGNOSIS — I10 Essential (primary) hypertension: Secondary | ICD-10-CM | POA: Diagnosis not present

## 2020-01-13 DIAGNOSIS — R42 Dizziness and giddiness: Secondary | ICD-10-CM | POA: Diagnosis not present

## 2020-01-13 DIAGNOSIS — G629 Polyneuropathy, unspecified: Secondary | ICD-10-CM | POA: Diagnosis not present

## 2020-01-13 DIAGNOSIS — E669 Obesity, unspecified: Secondary | ICD-10-CM | POA: Diagnosis not present

## 2020-01-13 DIAGNOSIS — R0982 Postnasal drip: Secondary | ICD-10-CM | POA: Diagnosis not present

## 2020-01-13 DIAGNOSIS — B373 Candidiasis of vulva and vagina: Secondary | ICD-10-CM | POA: Diagnosis not present

## 2020-01-13 DIAGNOSIS — Z0000 Encounter for general adult medical examination without abnormal findings: Secondary | ICD-10-CM | POA: Diagnosis not present

## 2020-01-13 DIAGNOSIS — Z853 Personal history of malignant neoplasm of breast: Secondary | ICD-10-CM | POA: Diagnosis not present

## 2020-01-13 DIAGNOSIS — R7301 Impaired fasting glucose: Secondary | ICD-10-CM | POA: Diagnosis not present

## 2020-01-13 DIAGNOSIS — Z0001 Encounter for general adult medical examination with abnormal findings: Secondary | ICD-10-CM | POA: Diagnosis not present

## 2020-01-14 ENCOUNTER — Other Ambulatory Visit (HOSPITAL_COMMUNITY): Payer: Self-pay | Admitting: Internal Medicine

## 2020-01-14 DIAGNOSIS — R1010 Upper abdominal pain, unspecified: Secondary | ICD-10-CM

## 2020-01-18 ENCOUNTER — Other Ambulatory Visit (HOSPITAL_COMMUNITY): Payer: Self-pay | Admitting: Adult Health Nurse Practitioner

## 2020-01-18 ENCOUNTER — Other Ambulatory Visit: Payer: Self-pay | Admitting: Nurse Practitioner

## 2020-01-18 DIAGNOSIS — K219 Gastro-esophageal reflux disease without esophagitis: Secondary | ICD-10-CM

## 2020-01-18 DIAGNOSIS — R110 Nausea: Secondary | ICD-10-CM

## 2020-01-18 DIAGNOSIS — R1013 Epigastric pain: Secondary | ICD-10-CM

## 2020-01-18 MED FILL — HYDROCHLOROTHIAZIDE 25 MG T: 25 | 90 days supply | Qty: 90 | Fill #0

## 2020-01-18 MED FILL — OLMESARTAN MEDOXOMIL 40 MG: 40 | 90 days supply | Qty: 90 | Fill #0

## 2020-01-19 ENCOUNTER — Other Ambulatory Visit: Payer: Self-pay | Admitting: Gastroenterology

## 2020-01-19 NOTE — Telephone Encounter (Signed)
Updated new Rx to once daily as patient reported GERD was well controlled on once daily dosing at her last OV with Roseanne Kaufman.

## 2020-01-20 ENCOUNTER — Other Ambulatory Visit: Payer: Self-pay

## 2020-01-20 ENCOUNTER — Ambulatory Visit (HOSPITAL_COMMUNITY)
Admission: RE | Admit: 2020-01-20 | Discharge: 2020-01-20 | Disposition: A | Discharge status: Home / Self Care | Payer: 59 | Source: Ambulatory Visit | Attending: Internal Medicine | Admitting: Internal Medicine

## 2020-01-20 DIAGNOSIS — R1010 Upper abdominal pain, unspecified: Secondary | ICD-10-CM | POA: Insufficient documentation

## 2020-01-20 DIAGNOSIS — K7689 Other specified diseases of liver: Secondary | ICD-10-CM | POA: Diagnosis not present

## 2020-01-20 MED FILL — OMEPRAZOLE 40 MG CPDR: 40 | 90 days supply | Qty: 90 | Fill #0

## 2020-01-20 NOTE — Telephone Encounter (Signed)
noted 

## 2020-01-21 ENCOUNTER — Other Ambulatory Visit (HOSPITAL_COMMUNITY): Payer: Self-pay | Admitting: Adult Health Nurse Practitioner

## 2020-01-21 DIAGNOSIS — Z0001 Encounter for general adult medical examination with abnormal findings: Secondary | ICD-10-CM | POA: Diagnosis not present

## 2020-01-21 DIAGNOSIS — Z6829 Body mass index (BMI) 29.0-29.9, adult: Secondary | ICD-10-CM | POA: Diagnosis not present

## 2020-01-21 DIAGNOSIS — K760 Fatty (change of) liver, not elsewhere classified: Secondary | ICD-10-CM | POA: Diagnosis not present

## 2020-01-21 DIAGNOSIS — I10 Essential (primary) hypertension: Secondary | ICD-10-CM | POA: Diagnosis not present

## 2020-01-21 DIAGNOSIS — R252 Cramp and spasm: Secondary | ICD-10-CM | POA: Diagnosis not present

## 2020-01-21 DIAGNOSIS — R7301 Impaired fasting glucose: Secondary | ICD-10-CM | POA: Diagnosis not present

## 2020-01-21 DIAGNOSIS — R0982 Postnasal drip: Secondary | ICD-10-CM | POA: Diagnosis not present

## 2020-01-21 DIAGNOSIS — Z0000 Encounter for general adult medical examination without abnormal findings: Secondary | ICD-10-CM | POA: Diagnosis not present

## 2020-01-21 DIAGNOSIS — Z712 Person consulting for explanation of examination or test findings: Secondary | ICD-10-CM | POA: Diagnosis not present

## 2020-01-21 DIAGNOSIS — H6990 Unspecified Eustachian tube disorder, unspecified ear: Secondary | ICD-10-CM | POA: Diagnosis not present

## 2020-01-21 DIAGNOSIS — R10816 Epigastric abdominal tenderness: Secondary | ICD-10-CM | POA: Diagnosis not present

## 2020-01-21 MED FILL — SUCRALFATE 1 GM TABLET: 1 | 20 days supply | Qty: 60 | Fill #0

## 2020-01-24 ENCOUNTER — Other Ambulatory Visit (HOSPITAL_COMMUNITY): Payer: Self-pay | Admitting: Adult Health Nurse Practitioner

## 2020-01-24 ENCOUNTER — Telehealth (HOSPITAL_COMMUNITY): Payer: Self-pay | Admitting: Surgery

## 2020-01-24 MED FILL — CLARITHROMYCIN 500 MG TAB: 500 | 21 days supply | Qty: 42 | Fill #0

## 2020-01-24 MED FILL — AMOXICILLIN 500 MG CAPSULE: 500 | 21 days supply | Qty: 84 | Fill #0

## 2020-01-24 NOTE — Telephone Encounter (Signed)
Pt left a voicemail stating that she started Carafate 2 days ago due to a stomach ulcer, and that she is concerned about taking it with her Tamoxifen.  She stated that she just started her period, and that she has not had a period for a year.  Dr. Delton Coombes was notified and stated that the pt needed to take the medications several hours apart, and that she needed to call and see her gynecologist as quickly as possible since the Tamoxifen can cause bleeding.  I called the pt back and she verbalized understanding of these instructions and was told to call back if she had any more concerns.

## 2020-01-31 MED FILL — TAMOXIFEN 20 MG TABLET: 20 | 30 days supply | Qty: 30 | Fill #1

## 2020-02-02 ENCOUNTER — Encounter: Payer: Self-pay | Admitting: Adult Health

## 2020-02-02 ENCOUNTER — Ambulatory Visit
Admission: RE | Admit: 2020-02-02 | Discharge: 2020-02-02 | Disposition: A | Discharge status: Home / Self Care | Payer: 59 | Source: Ambulatory Visit | Attending: Emergency Medicine | Admitting: Emergency Medicine

## 2020-02-02 ENCOUNTER — Ambulatory Visit (INDEPENDENT_AMBULATORY_CARE_PROVIDER_SITE_OTHER): Payer: 59 | Admitting: Adult Health

## 2020-02-02 ENCOUNTER — Other Ambulatory Visit: Payer: Self-pay

## 2020-02-02 ENCOUNTER — Ambulatory Visit (INDEPENDENT_AMBULATORY_CARE_PROVIDER_SITE_OTHER): Payer: 59

## 2020-02-02 VITALS — BP 144/87 | HR 97 | Ht 67.0 in | Wt 185.0 lb

## 2020-02-02 VITALS — BP 134/84 | HR 92 | Temp 98.5°F | Resp 18

## 2020-02-02 DIAGNOSIS — M79672 Pain in left foot: Secondary | ICD-10-CM

## 2020-02-02 DIAGNOSIS — Z853 Personal history of malignant neoplasm of breast: Secondary | ICD-10-CM

## 2020-02-02 DIAGNOSIS — N939 Abnormal uterine and vaginal bleeding, unspecified: Secondary | ICD-10-CM

## 2020-02-02 DIAGNOSIS — M7989 Other specified soft tissue disorders: Secondary | ICD-10-CM

## 2020-02-02 DIAGNOSIS — M7732 Calcaneal spur, left foot: Secondary | ICD-10-CM | POA: Diagnosis not present

## 2020-02-02 DIAGNOSIS — N950 Postmenopausal bleeding: Secondary | ICD-10-CM | POA: Diagnosis not present

## 2020-02-02 NOTE — ED Triage Notes (Signed)
Pt presents with left foot pain that began Sunday, denies injury but swelling noted , pain is in th ball of foot

## 2020-02-02 NOTE — Discharge Instructions (Addendum)
Take OTC Tylenol/ibuprofen as needed for pain Follow RICE instruction days attached Follow-up with PCP Return or go to ED if you develop any new or worsening of symptoms

## 2020-02-02 NOTE — ED Provider Notes (Signed)
[place]   [id number] 02/02/20 Arrival Time: 1138   Chief Complaint  Patient presents with   Foot Pain     SUBJECTIVE: History from: patient.  Lorraine Peters is a 49 y.o. female who presented to the urgent care for complaint of left foot pain that started this past Sunday.  Denies any precipitating event, trauma, or injury.  She localizes the pain to the left foot.  He describes the pain as constant and achy.  Her symptoms are made worse with range of motion.  She has tried OTC medication without relief.  Her symptoms are made worse with ROM.  She denies similar symptoms in the past.  Denies chills, fever, nausea, vomiting, diarrhea.  ROS: As per HPI.  All other pertinent ROS negative.      Past Medical History:  Diagnosis Date   Breast cancer ([place])    Breast disorder    left breast at age 56   Family history of breast cancer    Family history of colon cancer    H. pylori infection    History of kidney stones    Hypercholesterolemia    Hypertension    Migraine    Ovarian cyst    Personal history of radiation therapy    PONV (postoperative nausea and vomiting)    Reflux    Past Surgical History:  Procedure Laterality Date   BREAST LUMPECTOMY Left 2020   COLONOSCOPY N/A 12/30/2018   six 4-7 mm polyps in rectum, descending colon, and ascending colon. Sigmoid and descending colon diverticulosis. Serrated and tubular adenoma. Surveillance 3 years.    ESOPHAGOGASTRODUODENOSCOPY N/A 09/12/2017   normal esophagus, small hiatal hernia, large pedunculated duodenal polyp s/p resection and clip placement. Benign polyp   PARTIAL MASTECTOMY WITH NEEDLE LOCALIZATION AND AXILLARY SENTINEL LYMPH NODE BX Left 04/29/2018   Procedure: PARTIAL MASTECTOMY WITH NEEDLE LOCALIZATION AND AXILLARY SENTINEL LYMPH NODE BX;  Surgeon: Virl Cagey, MD;  Location: [facility] ORS;  Service: General;  Laterality: Left;   POLYPECTOMY  12/30/2018   Procedure: POLYPECTOMY;   Surgeon: Daneil Dolin, MD;  Location: [facility] ENDO SUITE;  Service: Endoscopy;;  ascending,descending,rectal   TUBAL LIGATION     Allergies  Allergen Reactions   Bee Venom Swelling and Other (See Comments)    Severe swelling   No current facility-administered medications on file prior to encounter.   Current Outpatient Medications on File Prior to Encounter  Medication Sig Dispense Refill   ALPRAZolam (XANAX) 0.25 MG tablet Take 1 tablet (0.25 mg total) by mouth at bedtime as needed for anxiety. 30 tablet 2   amoxicillin (AMOXIL) 500 MG capsule Take 1,000 mg by mouth 2 (two) times daily. For 3 weeks     atorvastatin (LIPITOR) 20 MG tablet TAKE 1 Tablet BY MOUTH ONCE DAILY (Patient taking differently: Take 10 mg by mouth at bedtime. ) 90 tablet 2   CALCIUM PO Take 2,400 mg by mouth daily.     clarithromycin (BIAXIN) 500 MG tablet Take 500 mg by mouth 2 (two) times daily. For 3 weeks     fexofenadine (ALLEGRA) 180 MG tablet Take 180 mg by mouth at bedtime.     hydrochlorothiazide (HYDRODIURIL) 25 MG tablet Take 25 mg by mouth daily.     meclizine (ANTIVERT) 25 MG tablet Take 1 tablet (25 mg total) by mouth 3 (three) times daily as needed for dizziness. 30 tablet 0   nystatin-triamcinolone (MYCOLOG II) cream Apply 1 application topically as needed.  olmesartan (BENICAR) 40 MG tablet Take 40 mg by mouth at bedtime.      omeprazole (PRILOSEC) 40 MG capsule Take 1 capsule (40 mg total) by mouth daily. (Patient taking differently: Take 80 mg by mouth daily. ) 90 capsule 1   polyethylene glycol (MIRALAX / GLYCOLAX) 17 g packet Take 17 g by mouth daily.      tamoxifen (NOLVADEX) 20 MG tablet Take 1 tablet (20 mg total) by mouth daily. 30 tablet 5   Social History   Socioeconomic History   Marital status: Divorced    Spouse name: Not on file   Number of children: 1   Years of education: Not on file   Highest education level: Not on file  Occupational History   Not on  file  Tobacco Use   Smoking status: Former Smoker    Packs/day: 0.50    Years: 28.00    Pack years: 14.00    Types: Cigarettes    Quit date: 04/22/2018    Years since quitting: 1.7   Smokeless tobacco: Never Used  Vaping Use   Vaping Use: Never used  Substance and Sexual Activity   Alcohol use: No    Alcohol/week: 0.0 standard drinks   Drug use: No   Sexual activity: Not Currently    Birth control/protection: Surgical    Comment: tubal  Other Topics Concern   Not on file  Social History Narrative   Not on file   Social Determinants of Health   Financial Resource Strain: Low Risk    Difficulty of Paying Living Expenses: Not very hard  Food Insecurity: No Food Insecurity   Worried About Charity fundraiser in the Last Year: Never true   Ran Out of Food in the Last Year: Never true  Transportation Needs: No Transportation Needs   Lack of Transportation (Medical): No   Lack of Transportation (Non-Medical): No  Physical Activity: Insufficiently Active   Days of Exercise per Week: 3 days   Minutes of Exercise per Session: 30 min  Stress: No Stress Concern Present   Feeling of Stress : Only a little  Social Connections: Moderately Isolated   Frequency of Communication with Friends and Family: More than three times a week   Frequency of Social Gatherings with Friends and Family: Twice a week   Attends Religious Services: More than 4 times per year   Active Member of [employer] or Organizations: No   Attends Music therapist: Never   Marital Status: Divorced  Human resources officer Violence: Not At Risk   Fear of Current or Ex-Partner: No   Emotionally Abused: No   Physically Abused: No   Sexually Abused: No   Family History  Problem Relation Age of Onset   Arthritis Mother    Cancer Mother        breast   Breast cancer Mother 87       recurrance at 44   COPD Father    Heart disease Father    Hyperlipidemia Father    Hypertension  Father    Cancer Sister 6       anal, deceased due to mva but had advanced disease   Stroke Maternal Grandfather    Breast cancer Maternal Aunt 58   Breast cancer Maternal Uncle        colon    OBJECTIVE:  Vitals:   02/02/20 1152  BP: 134/84  Pulse: 92  Resp: 18  Temp: 98.5 F (36.9 C)  SpO2: 96%  Physical Exam Vitals and nursing note reviewed.  Constitutional:      General: She is not in acute distress.    Appearance: Normal appearance. She is normal weight. She is not ill-appearing, toxic-appearing or diaphoretic.  HENT:     Head: Normocephalic.  Cardiovascular:     Rate and Rhythm: Normal rate and regular rhythm.     Pulses: Normal pulses.     Heart sounds: Normal heart sounds. No murmur heard.  No friction rub. No gallop.   Pulmonary:     Effort: Pulmonary effort is normal. No respiratory distress.     Breath sounds: Normal breath sounds. No stridor. No wheezing, rhonchi or rales.  Chest:     Chest wall: No tenderness.  Musculoskeletal:        General: Tenderness present.     Comments: The left foot is without any obvious asymmetry or deformity compared to the right foot.  Tenderness and swelling present.  There is no ecchymosis, open wound, surface trauma, lesion, warmth present.  Limited range of motion in left big toe due to pain.  Neurovascular status intact.  Neurological:     Mental Status: She is alert and oriented to person, place, and time.      LABS:  No results found for this or any previous visit (from the past 24 hour(s)).   RADIOLOGY:  DG Foot Complete Left  Result Date: 02/02/2020 CLINICAL DATA:  Pain and swelling EXAM: LEFT FOOT - COMPLETE 3+ VIEW COMPARISON:  None. FINDINGS: Frontal, oblique, and lateral views were obtained. There is no fracture or dislocation. The joint spaces appear normal. There are posteroinferior calcaneal spurs. No erosion. IMPRESSION: Calcaneal spurs. No appreciable joint space narrowing. No fracture or  dislocation. Electronically Signed   By: Lowella Grip III M.D.   On: 02/02/2020 12:17   Left foot X-ray is negative for bony abnormality including fracture or dislocation.  I have reviewed the x-ray myself and the radiologist interpretation.  I am in agreement with the radiologist interpretation.   ASSESSMENT & PLAN:  1. Left foot pain     No orders of the defined types were placed in this encounter.   Discharge instructions...   Take OTC Tylenol/ibuprofen as needed for pain Follow RICE instruction days attached Follow-up with PCP Return or go to ED if you develop any new or worsening of symptoms  Reviewed expectations re: course of current medical issues. Questions answered. Outlined signs and symptoms indicating need for more acute intervention. Patient verbalized understanding. After Visit Summary given.         Emerson Monte, FNP 02/02/20 1226

## 2020-02-02 NOTE — Progress Notes (Signed)
  Subjective:     Patient ID: Lorraine Peters, female   DOB: 06-04-1970, 49 y.o.   MRN: [id number]  HPI Lorraine Peters is a 49 year old white female,divorced, in complaining of having vaginal bleeding like a period 01/23/20,she is on tamoxifen for breast cancer, and has not had a period over a year. She denies any pain with bleeding.She is being treated for H pylori.  She has pain in left foot and big toe area and has appointment today for that with Urgent Care. PCP is Dr Nevada Crane.  Review of Systems Vaginal bleeding No pain with bleeding Pain left foot Reviewed past medical,surgical, social and family history. Reviewed medications and allergies.     Objective:   Physical Exam BP (!) 144/87 (BP Location: Right Arm, Patient Position: Sitting, Cuff Size: Normal)   Pulse 97   Ht 5\' 7"  (1.702 m)   Wt 185 lb (83.9 kg)   LMP 09/22/2018 (Approximate) Comment:  on tamoxifen no period over a year bleedi 11/14 like a period   BMI 28.98 kg/m  Skin warm and dry.Pelvic: external genitalia is normal in appearance no lesions, vagina: pale pink,urethra has no lesions or masses noted, cervix:smooth and bulbous, uterus: normal size, shape and contour, non tender, no masses felt, adnexa: no masses or tenderness noted. Bladder is non tender and no masses felt.  Examination chaperoned by Glenard Haring RN     Up[place] - 02/02/20 1028      Pregnancy Intention Screening   Does the patient want to become pregnant in the next year? No    Does the patient's partner want to become pregnant in the next year? No    Would the patient like to discuss contraceptive options today? No      Contraception Wrap Up   Current Method Female Sterilization    End Method Female Sterilization    Contraception Counseling Provided No          Assessment:     1. Vaginal bleeding Will get GYN [place] in 1 week   2. PMB (postmenopausal bleeding) Will get GYN [place] in 1 week, to rule out endometrial thickening or polyp, she is aware if endometrium is  thickened will need biopsy   3. Personal history of breast cancer     Plan:     Will talk when [place] results back

## 2020-02-09 ENCOUNTER — Ambulatory Visit (INDEPENDENT_AMBULATORY_CARE_PROVIDER_SITE_OTHER): Payer: 59

## 2020-02-09 ENCOUNTER — Other Ambulatory Visit: Payer: Self-pay

## 2020-02-09 DIAGNOSIS — N950 Postmenopausal bleeding: Secondary | ICD-10-CM | POA: Diagnosis not present

## 2020-02-09 DIAGNOSIS — N939 Abnormal uterine and vaginal bleeding, unspecified: Secondary | ICD-10-CM

## 2020-02-09 DIAGNOSIS — Z853 Personal history of malignant neoplasm of breast: Secondary | ICD-10-CM | POA: Diagnosis not present

## 2020-02-09 NOTE — Progress Notes (Signed)
PELVIC US TA/TV: heterogeneous anteverted uterus with mult small myometrial cysts and linear striations,echogenic linear striations fanning out from the endometrium,(? Adenomyosis),homogeneous thickened endometrium 8.2 mm,normal right ovary,two small simple left ovarian cysts (#1) 1.5 x 1.4 x 1.4 cm,(#2) 1.7 x .9 x 1.6 cm,ovaries appear mobile,left adnexal pain during ultrasound,no free fluid  Chaperone Estill Bamberg

## 2020-02-10 ENCOUNTER — Telehealth: Payer: Self-pay | Admitting: Adult Health

## 2020-02-10 NOTE — Telephone Encounter (Signed)
Pt aware that US showed thickened endometrium will get endometrial biopsy with Dr Elonda Husky and place in recall for follow up US in 6 months for cysts on ovaries

## 2020-02-14 ENCOUNTER — Other Ambulatory Visit: Payer: Self-pay | Admitting: Obstetrics & Gynecology

## 2020-02-14 DIAGNOSIS — N840 Polyp of corpus uteri: Secondary | ICD-10-CM | POA: Diagnosis not present

## 2020-02-15 ENCOUNTER — Encounter: Payer: Self-pay | Admitting: Obstetrics & Gynecology

## 2020-02-15 ENCOUNTER — Ambulatory Visit (INDEPENDENT_AMBULATORY_CARE_PROVIDER_SITE_OTHER): Payer: 59 | Admitting: Obstetrics & Gynecology

## 2020-02-15 ENCOUNTER — Other Ambulatory Visit: Payer: Self-pay

## 2020-02-15 VITALS — BP 132/82 | HR 77 | Ht 67.0 in | Wt 186.5 lb

## 2020-02-15 DIAGNOSIS — R9389 Abnormal findings on diagnostic imaging of other specified body structures: Secondary | ICD-10-CM | POA: Diagnosis not present

## 2020-02-15 DIAGNOSIS — N950 Postmenopausal bleeding: Secondary | ICD-10-CM

## 2020-02-15 NOTE — Progress Notes (Signed)
Endometrial Biopsy Procedure Note  Pre-operative Diagnosis: Post menopausal bleeding with thickened endometrium on sonogram  Post-operative Diagnosis: same  Indications: postmenopausal bleeding on Tamoxifen with 6 mm stripe see Last menses over 1 year ago  Procedure Details   Urine pregnancy test was not done.  The risks (including infection, bleeding, pain, and uterine perforation) and benefits of the procedure were explained to the patient and Written informed consent was obtained.  Antibiotic prophylaxis against endocarditis was not indicated.   The patient was placed in the dorsal lithotomy position.  Bimanual exam showed the uterus to be in the neutral position.  A Graves' speculum inserted in the vagina, and the cervix prepped with povidone iodine.  Endocervical curettage with a Kevorkian curette was not performed.   A sharp tenaculum was applied to the anterior lip of the cervix for stabilization.  A sterile uterine sound was used to sound the uterus to a depth of 6.5 cm.  A Pipelle endometrial aspirator was used to sample the endometrium.  Sample was sent for pathologic examination.  Condition: Stable  Complications: None  Plan:  The patient was advised to call for any fever or for prolonged or severe pain or bleeding. She was advised to use OTC analgesics as needed for mild to moderate pain. She was advised to avoid vaginal intercourse for 48 hours or until the bleeding has completely stopped.  Attending Physician Documentation: I was present for or performed the following: endometrial biopsy

## 2020-02-15 NOTE — Addendum Note (Signed)
Addended by: Levy Pupa S on: 02/15/2020 12:00 PM   Modules accepted: Orders

## 2020-02-18 ENCOUNTER — Ambulatory Visit: Payer: 59 | Admitting: Gastroenterology

## 2020-02-18 ENCOUNTER — Other Ambulatory Visit: Payer: Self-pay

## 2020-02-18 ENCOUNTER — Encounter: Payer: Self-pay | Admitting: Gastroenterology

## 2020-02-18 DIAGNOSIS — Z8619 Personal history of other infectious and parasitic diseases: Secondary | ICD-10-CM

## 2020-02-18 DIAGNOSIS — D1730 Benign lipomatous neoplasm of skin and subcutaneous tissue of unspecified sites: Secondary | ICD-10-CM | POA: Diagnosis not present

## 2020-02-18 NOTE — Progress Notes (Signed)
Referring Provider: Celene Squibb, MD Primary Care Physician:  Celene Squibb, MD Primary GI: Dr. Gala Romney   Chief Complaint  Patient presents with  . H. Pylori    Treated by PCP. Finished ABT 12/7, feeling better    HPI:   Lorraine Peters is a 49 y.o. female presenting today with a history of history of GERD, multiple adenomas with surveillance due in 2023, abdominal pain s/p EGD in July 2019. RUQ [place] July 2019 with fatty liver, no gallstones.    New onset dyspepsia in interim from last visit in June 2021. States was tested for H.pylori via breath test. Antibiotics finished last Tuesday for H.pylori. Pain and nausea in interim from last visit. Much improved now. Sometimes feels like spasms in upper abdomen but not painful. Overall improved s/p treatment.   Rash improved between buttocks.   She is worried about a nodule in left abdomen, concerned this may be lymphadenopathy.    Past Medical History:  Diagnosis Date  . Breast cancer ([place])   . Breast disorder    left breast at age 66  . Family history of breast cancer   . Family history of colon cancer   . H. pylori infection   . History of kidney stones   . Hypercholesterolemia   . Hypertension   . Migraine   . Ovarian cyst   . Personal history of radiation therapy   . PONV (postoperative nausea and vomiting)   . Reflux     Past Surgical History:  Procedure Laterality Date  . BREAST LUMPECTOMY Left 2020  . COLONOSCOPY N/A 12/30/2018   six 4-7 mm polyps in rectum, descending colon, and ascending colon. Sigmoid and descending colon diverticulosis. Serrated and tubular adenoma. Surveillance 3 years.   . ESOPHAGOGASTRODUODENOSCOPY N/A 09/12/2017   normal esophagus, small hiatal hernia, large pedunculated duodenal polyp s/p resection and clip placement. Benign polyp  . PARTIAL MASTECTOMY WITH NEEDLE LOCALIZATION AND AXILLARY SENTINEL LYMPH NODE BX Left 04/29/2018   Procedure: PARTIAL MASTECTOMY WITH NEEDLE LOCALIZATION AND  AXILLARY SENTINEL LYMPH NODE BX;  Surgeon: Virl Cagey, MD;  Location: [facility];  Service: General;  Laterality: Left;  . POLYPECTOMY  12/30/2018   Procedure: POLYPECTOMY;  Surgeon: Daneil Dolin, MD;  Location: [facility];  Service: Endoscopy;;  ascending,descending,rectal  . TUBAL LIGATION      Current Outpatient Medications  Medication Sig Dispense Refill  . ALPRAZolam (XANAX) 0.25 MG tablet Take 1 tablet (0.25 mg total) by mouth at bedtime as needed for anxiety. 30 tablet 2  . atorvastatin (LIPITOR) 20 MG tablet TAKE 1 Tablet BY MOUTH ONCE DAILY (Patient taking differently: Take 10 mg by mouth at bedtime.) 90 tablet 2  . CALCIUM PO Take 2,400 mg by mouth daily.    . fexofenadine (ALLEGRA) 180 MG tablet Take 180 mg by mouth at bedtime.    . hydrochlorothiazide (HYDRODIURIL) 25 MG tablet Take 25 mg by mouth daily.    . meclizine (ANTIVERT) 25 MG tablet Take 1 tablet (25 mg total) by mouth 3 (three) times daily as needed for dizziness. 30 tablet 0  . nystatin-triamcinolone (MYCOLOG II) cream Apply 1 application topically as needed.     Marland Kitchen olmesartan (BENICAR) 40 MG tablet Take 40 mg by mouth daily.    Marland Kitchen omeprazole (PRILOSEC) 40 MG capsule Take 1 capsule (40 mg total) by mouth daily. (Patient taking differently: Take 40 mg by mouth in the morning and at bedtime.) 90 capsule 1  .  polyethylene glycol (MIRALAX / GLYCOLAX) 17 g packet Take 17 g by mouth daily.     . Probiotic Product (PROBIOTIC PO) Take by mouth daily.    . tamoxifen (NOLVADEX) 20 MG tablet Take 1 tablet (20 mg total) by mouth daily. 30 tablet 5   No current facility-administered medications for this visit.    Allergies as of 02/18/2020 - Review Complete 02/18/2020  Allergen Reaction Noted  . Bee venom Swelling and Other (See Comments) 04/29/2011    Family History  Problem Relation Age of Onset  . Arthritis Mother   . Cancer Mother        breast  . Breast cancer Mother 9       recurrance at 34  . COPD Father    . Heart disease Father   . Hyperlipidemia Father   . Hypertension Father   . Cancer Sister 70       anal, deceased due to mva but had advanced disease  . Stroke Maternal Grandfather   . Breast cancer Maternal Aunt 58  . Breast cancer Maternal Uncle        colon    Social History   Socioeconomic History  . Marital status: Divorced    Spouse name: Not on file  . Number of children: 1  . Years of education: Not on file  . Highest education level: Not on file  Occupational History  . Not on file  Tobacco Use  . Smoking status: Former Smoker    Packs/day: 0.50    Years: 28.00    Pack years: 14.00    Types: Cigarettes    Quit date: 04/22/2018    Years since quitting: 1.8  . Smokeless tobacco: Never Used  Vaping Use  . Vaping Use: Never used  Substance and Sexual Activity  . Alcohol use: No    Alcohol/week: 0.0 standard drinks  . Drug use: No  . Sexual activity: Not Currently    Birth control/protection: Surgical    Comment: tubal  Other Topics Concern  . Not on file  Social History Narrative  . Not on file   Social Determinants of Health   Financial Resource Strain: Low Risk   . Difficulty of Paying Living Expenses: Not very hard  Food Insecurity: No Food Insecurity  . Worried About Charity fundraiser in the Last Year: Never true  . Ran Out of Food in the Last Year: Never true  Transportation Needs: No Transportation Needs  . Lack of Transportation (Medical): No  . Lack of Transportation (Non-Medical): No  Physical Activity: Insufficiently Active  . Days of Exercise per Week: 3 days  . Minutes of Exercise per Session: 30 min  Stress: No Stress Concern Present  . Feeling of Stress : Only a little  Social Connections: Moderately Isolated  . Frequency of Communication with Friends and Family: More than three times a week  . Frequency of Social Gatherings with Friends and Family: Twice a week  . Attends Religious Services: More than 4 times per year  . Active  Member of Clubs or Organizations: No  . Attends Archivist Meetings: Never  . Marital Status: Divorced    Review of Systems: Gen: Denies fever, chills, anorexia. Denies fatigue, weakness, weight loss.  CV: Denies chest pain, palpitations, syncope, peripheral edema, and claudication. Resp: Denies dyspnea at rest, cough, wheezing, coughing up blood, and pleurisy. GI: see HPI Derm: Denies rash, itching, dry skin Psych: Denies depression, anxiety, memory loss, confusion. No homicidal or  suicidal ideation.  Heme: Denies bruising, bleeding, and enlarged lymph nodes.  Physical Exam: BP 125/71   Pulse 74   Temp (!) 96.9 F (36.1 C) (Temporal)   Ht 5\' 7"  (1.702 m)   Wt 186 lb 9.6 oz (84.6 kg)   LMP 04/04/2019 (Approximate)   BMI 29.23 kg/m  General:   Alert and oriented. No distress noted. Pleasant and cooperative.  Head:  Normocephalic and atraumatic. Eyes:  Conjuctiva clear without scleral icterus. Mouth:  Mask in place Abdomen:  +BS, soft, non-tender and non-distended. No rebound or guarding. No HSM. Small, non-fixed hard circular lesion LUQ that seems most consistent with lipoma.  Msk:  Symmetrical without gross deformities. Normal posture. Extremities:  Without edema. Neurologic:  Alert and  oriented x4 Psych:  Alert and cooperative. Normal mood and affect.  ASSESSMENT: AVALYNNE DIVER is a 49 y.o. female presenting today with a history of GERD, multiple adenomas with surveillance due in 2023, recently treated for H.pylori after positive urea breath test through PCP. She has had notable improvement in dyspepsia that was noted in interim from last visit.  Will continue Prilosec once daily. Need to document eradication, so we will just wait on this till after the holidays. She will hold Prilosec starting  January 1st for 14 days, then will pursue urea breath test.   LUQ lesion that seem to be most consistent with lipoma. Will reach out to ultrasound regarding best way to  examine this.    PLAN:  Dedicated imaging of LUQ lesion in near future: best route to be determined   Urea breath test in Jan 2022 after holding PPI X 14 days prior  Return in 6 months  Annitta Needs, PhD, [facility]

## 2020-02-18 NOTE — Progress Notes (Signed)
H

## 2020-02-18 NOTE — Patient Instructions (Addendum)
Let's decrease the Prilosec to once per day, 30 minutes before breakfast.   We need to check another breath test to ensure you were completely treated for H.pylori. Starting Jan 1st, do not take Prilosec. Do not take for 2 weeks, then you can go to the lab and have the breath test done. After that, you can resume Prilosec.  I have ordered a dedicated ultrasound for the left lesion in the tissue that I do think is likely a benign fatty lipoma.  We will see you in 6 months!  I enjoyed seeing you again today! As you know, I value our relationship and want to provide genuine, compassionate, and quality care. I welcome your feedback. If you receive a survey regarding your visit,  I greatly appreciate you taking time to fill this out. See you next time!  Annitta Needs, PhD, ANP-BC [facility]

## 2020-02-22 ENCOUNTER — Ambulatory Visit (INDEPENDENT_AMBULATORY_CARE_PROVIDER_SITE_OTHER): Payer: 59 | Admitting: Obstetrics & Gynecology

## 2020-02-22 ENCOUNTER — Other Ambulatory Visit: Payer: Self-pay

## 2020-02-22 ENCOUNTER — Encounter: Payer: Self-pay | Admitting: Obstetrics & Gynecology

## 2020-02-22 VITALS — BP 127/86 | HR 75 | Ht 67.0 in | Wt 187.0 lb

## 2020-02-22 DIAGNOSIS — N840 Polyp of corpus uteri: Secondary | ICD-10-CM | POA: Diagnosis not present

## 2020-02-22 NOTE — Progress Notes (Signed)
Follow up appointment for results  Chief Complaint  Patient presents with  . Results    Blood pressure 127/86, pulse 75, height 5\' 7"  (1.702 m), weight 187 lb (84.8 kg), last menstrual period 04/04/2019.  Endometrial biopsy benign endometrial polyp    MEDS ordered this encounter: No orders of the defined types were placed in this encounter.   Orders for this encounter: No orders of the defined types were placed in this encounter.   Impression:   ICD-10-CM   1. Endometrial polyp  N84.0    on tamoxifen therapy for breast cancer chemoprophylaxis     Plan: Pt will decide on conservative following vs hysteroscopy with uterine curettage  Follow Up: Return if symptoms worsen or fail to improve.       Face to face time:  10 minutes  Greater than 50% of the visit time was spent in counseling and coordination of care with the patient.  The summary and outline of the counseling and care coordination is summarized in the note above.   All questions were answered.  Past Medical History:  Diagnosis Date  . Breast cancer ([place])   . Breast disorder    left breast at age 23  . Family history of breast cancer   . Family history of colon cancer   . H. pylori infection   . History of kidney stones   . Hypercholesterolemia   . Hypertension   . Migraine   . Ovarian cyst   . Personal history of radiation therapy   . PONV (postoperative nausea and vomiting)   . Reflux     Past Surgical History:  Procedure Laterality Date  . BREAST LUMPECTOMY Left 2020  . COLONOSCOPY N/A 12/30/2018   six 4-7 mm polyps in rectum, descending colon, and ascending colon. Sigmoid and descending colon diverticulosis. Serrated and tubular adenoma. Surveillance 3 years.   . ESOPHAGOGASTRODUODENOSCOPY N/A 09/12/2017   normal esophagus, small hiatal hernia, large pedunculated duodenal polyp s/p resection and clip placement. Benign polyp  . PARTIAL MASTECTOMY WITH NEEDLE LOCALIZATION AND AXILLARY  SENTINEL LYMPH NODE BX Left 04/29/2018   Procedure: PARTIAL MASTECTOMY WITH NEEDLE LOCALIZATION AND AXILLARY SENTINEL LYMPH NODE BX;  Surgeon: Virl Cagey, MD;  Location: [facility];  Service: General;  Laterality: Left;  . POLYPECTOMY  12/30/2018   Procedure: POLYPECTOMY;  Surgeon: Daneil Dolin, MD;  Location: [facility] END[facility] SUITE;  Service: Endoscopy;;  ascending,descending,rectal  . TUBAL LIGATION      OB History    Gravida  1   Para  1   Term      Preterm  1   AB      Living  1     SAB      IAB      Ectopic      Multiple      Live Births  1           Allergies  Allergen Reactions  . Bee Venom Swelling and Other (See Comments)    Severe swelling    Social History   Socioeconomic History  . Marital status: Divorced    Spouse name: Not on file  . Number of children: 1  . Years of education: Not on file  . Highest education level: Not on file  Occupational History  . Not on file  Tobacco Use  . Smoking status: Former Smoker    Packs/day: 0.50    Years: 28.00    Pack years: 14.00    Types:  Cigarettes    Quit date: 04/22/2018    Years since quitting: 1.8  . Smokeless tobacco: Never Used  Vaping Use  . Vaping Use: Never used  Substance and Sexual Activity  . Alcohol use: No    Alcohol/week: 0.0 standard drinks  . Drug use: No  . Sexual activity: Not Currently    Birth control/protection: Surgical    Comment: tubal  Other Topics Concern  . Not on file  Social History Narrative  . Not on file   Social Determinants of Health   Financial Resource Strain: Low Risk   . Difficulty of Paying Living Expenses: Not very hard  Food Insecurity: No Food Insecurity  . Worried About Charity fundraiser in the Last Year: Never true  . Ran Out of Food in the Last Year: Never true  Transportation Needs: No Transportation Needs  . Lack of Transportation (Medical): No  . Lack of Transportation (Non-Medical): No  Physical Activity: Insufficiently Active  .  Days of Exercise per Week: 3 days  . Minutes of Exercise per Session: 30 min  Stress: No Stress Concern Present  . Feeling of Stress : Only a little  Social Connections: Moderately Isolated  . Frequency of Communication with Friends and Family: More than three times a week  . Frequency of Social Gatherings with Friends and Family: Twice a week  . Attends Religious Services: More than 4 times per year  . Active Member of Clubs or Organizations: No  . Attends Archivist Meetings: Never  . Marital Status: Divorced    Family History  Problem Relation Age of Onset  . Arthritis Mother   . Cancer Mother        breast  . Breast cancer Mother 52       recurrance at 35  . COPD Father   . Heart disease Father   . Hyperlipidemia Father   . Hypertension Father   . Cancer Sister 62       anal, deceased due to mva but had advanced disease  . Stroke Maternal Grandfather   . Breast cancer Maternal Aunt 58  . Breast cancer Maternal Uncle        colon

## 2020-02-23 ENCOUNTER — Telehealth: Payer: Self-pay | Admitting: Gastroenterology

## 2020-02-23 DIAGNOSIS — D1730 Benign lipomatous neoplasm of skin and subcutaneous tissue of unspecified sites: Secondary | ICD-10-CM

## 2020-02-23 NOTE — Telephone Encounter (Signed)
[facility] clinical pool:  I spoke with Rich in Ultrasound. I placed orders for limited ultrasound with comments to assess the LUQ "mass". Thanks! Please arrange for patient. We held off at the visit, because I needed to find out best way to order.

## 2020-02-23 NOTE — Progress Notes (Signed)
CC'ED TO PCP 

## 2020-02-24 NOTE — Telephone Encounter (Signed)
[place] scheduled for 03/01/20 at 8:30am, arrive at 8:15am. NPO after midnight prior to test.  Called and informed pt of [place] appt.

## 2020-03-01 ENCOUNTER — Ambulatory Visit (HOSPITAL_COMMUNITY)
Admission: RE | Admit: 2020-03-01 | Discharge: 2020-03-01 | Disposition: A | Discharge status: Home / Self Care | Payer: 59 | Source: Ambulatory Visit | Attending: Gastroenterology | Admitting: Gastroenterology

## 2020-03-01 ENCOUNTER — Other Ambulatory Visit: Payer: Self-pay

## 2020-03-01 DIAGNOSIS — D1730 Benign lipomatous neoplasm of skin and subcutaneous tissue of unspecified sites: Secondary | ICD-10-CM | POA: Diagnosis not present

## 2020-03-01 DIAGNOSIS — R1902 Left upper quadrant abdominal swelling, mass and lump: Secondary | ICD-10-CM | POA: Diagnosis not present

## 2020-03-02 MED FILL — TAMOXIFEN 20 MG TABLET: 20 | 30 days supply | Qty: 30 | Fill #2

## 2020-03-22 DIAGNOSIS — I10 Essential (primary) hypertension: Secondary | ICD-10-CM | POA: Diagnosis not present

## 2020-03-22 DIAGNOSIS — Z6829 Body mass index (BMI) 29.0-29.9, adult: Secondary | ICD-10-CM | POA: Diagnosis not present

## 2020-03-22 DIAGNOSIS — H6990 Unspecified Eustachian tube disorder, unspecified ear: Secondary | ICD-10-CM | POA: Diagnosis not present

## 2020-03-22 DIAGNOSIS — Z712 Person consulting for explanation of examination or test findings: Secondary | ICD-10-CM | POA: Diagnosis not present

## 2020-03-22 DIAGNOSIS — R7301 Impaired fasting glucose: Secondary | ICD-10-CM | POA: Diagnosis not present

## 2020-03-22 DIAGNOSIS — R0982 Postnasal drip: Secondary | ICD-10-CM | POA: Diagnosis not present

## 2020-03-22 DIAGNOSIS — Z0001 Encounter for general adult medical examination with abnormal findings: Secondary | ICD-10-CM | POA: Diagnosis not present

## 2020-03-22 DIAGNOSIS — R252 Cramp and spasm: Secondary | ICD-10-CM | POA: Diagnosis not present

## 2020-03-22 DIAGNOSIS — R10816 Epigastric abdominal tenderness: Secondary | ICD-10-CM | POA: Diagnosis not present

## 2020-03-29 DIAGNOSIS — D0512 Intraductal carcinoma in situ of left breast: Secondary | ICD-10-CM | POA: Diagnosis not present

## 2020-03-29 DIAGNOSIS — B9681 Helicobacter pylori [H. pylori] as the cause of diseases classified elsewhere: Secondary | ICD-10-CM | POA: Diagnosis not present

## 2020-03-29 DIAGNOSIS — Z6828 Body mass index (BMI) 28.0-28.9, adult: Secondary | ICD-10-CM | POA: Diagnosis not present

## 2020-03-29 DIAGNOSIS — Z853 Personal history of malignant neoplasm of breast: Secondary | ICD-10-CM | POA: Diagnosis not present

## 2020-03-29 DIAGNOSIS — E669 Obesity, unspecified: Secondary | ICD-10-CM | POA: Diagnosis not present

## 2020-03-29 DIAGNOSIS — I10 Essential (primary) hypertension: Secondary | ICD-10-CM | POA: Diagnosis not present

## 2020-03-29 DIAGNOSIS — R7301 Impaired fasting glucose: Secondary | ICD-10-CM | POA: Diagnosis not present

## 2020-03-29 DIAGNOSIS — F411 Generalized anxiety disorder: Secondary | ICD-10-CM | POA: Diagnosis not present

## 2020-03-29 DIAGNOSIS — E782 Mixed hyperlipidemia: Secondary | ICD-10-CM | POA: Diagnosis not present

## 2020-03-30 DIAGNOSIS — Z8619 Personal history of other infectious and parasitic diseases: Secondary | ICD-10-CM | POA: Diagnosis not present

## 2020-03-31 LAB — H. PYLORI BREATH TEST: H. pylori Breath Test: NOT DETECTED

## 2020-04-03 MED FILL — OLMESARTAN MEDOXOMIL 40 MG: 40 | 90 days supply | Qty: 90 | Fill #1

## 2020-04-03 MED FILL — TAMOXIFEN 20 MG TABLET: 20 | 30 days supply | Qty: 30 | Fill #3

## 2020-04-03 MED FILL — OMEPRAZOLE 40 MG CPDR: 40 | 90 days supply | Qty: 90 | Fill #1

## 2020-04-18 ENCOUNTER — Other Ambulatory Visit (HOSPITAL_COMMUNITY): Payer: 59

## 2020-04-25 ENCOUNTER — Ambulatory Visit (HOSPITAL_COMMUNITY): Payer: 59 | Admitting: Hematology

## 2020-04-26 MED FILL — HYDROCHLOROTHIAZIDE 25 MG T: 25 | 90 days supply | Qty: 90 | Fill #1

## 2020-04-27 MED FILL — TAMOXIFEN 20 MG TABLET: 20 | 30 days supply | Qty: 30 | Fill #4

## 2020-05-02 ENCOUNTER — Other Ambulatory Visit (HOSPITAL_COMMUNITY)
Admission: RE | Admit: 2020-05-02 | Discharge: 2020-05-02 | Disposition: A | Discharge status: Home / Self Care | Payer: 59 | Attending: Hematology | Admitting: Hematology

## 2020-05-02 DIAGNOSIS — D0512 Intraductal carcinoma in situ of left breast: Secondary | ICD-10-CM | POA: Insufficient documentation

## 2020-05-02 LAB — COMPREHENSIVE METABOLIC PANEL
ALT: 16 U/L (ref 0–44)
AST: 16 U/L (ref 15–41)
Albumin: 3.5 g/dL (ref 3.5–5.0)
Alkaline Phosphatase: 43 U/L (ref 38–126)
Anion gap: 8 (ref 5–15)
BUN: 13 mg/dL (ref 6–20)
CO2: 24 mmol/L (ref 22–32)
Calcium: 8.7 mg/dL — ABNORMAL LOW (ref 8.9–10.3)
Chloride: 103 mmol/L (ref 98–111)
Creatinine, Ser: 0.52 mg/dL (ref 0.44–1.00)
GFR, Estimated: >60 mL/min (ref >60)
Glucose, Bld: 103 mg/dL — ABNORMAL HIGH (ref 70–99)
Potassium: 3.4 mmol/L — ABNORMAL LOW (ref 3.5–5.1)
Sodium: 135 mmol/L (ref 135–145)
Total Bilirubin: 0.2 mg/dL — ABNORMAL LOW (ref 0.3–1.2)
Total Protein: 6.4 g/dL — ABNORMAL LOW (ref 6.5–8.1)

## 2020-05-02 LAB — CBC WITH DIFFERENTIAL/PLATELET
Abs Immature Granulocytes: 0.02 10*3/uL (ref 0.00–0.07)
Basophils Absolute: 0 10*3/uL (ref 0.0–0.1)
Basophils Relative: 0 %
Eosinophils Absolute: 0.1 10*3/uL (ref 0.0–0.5)
Eosinophils Relative: 1 %
HCT: 36.2 % (ref 36.0–46.0)
Hemoglobin: 11.7 g/dL — ABNORMAL LOW (ref 12.0–15.0)
Immature Granulocytes: 0 %
Lymphocytes Relative: 33 %
Lymphs Abs: 2.4 10*3/uL (ref 0.7–4.0)
MCH: 27.9 pg (ref 26.0–34.0)
MCHC: 32.3 g/dL (ref 30.0–36.0)
MCV: 86.2 fL (ref 80.0–100.0)
Monocytes Absolute: 0.6 10*3/uL (ref 0.1–1.0)
Monocytes Relative: 7 %
Neutro Abs: 4.3 10*3/uL (ref 1.7–7.7)
Neutrophils Relative %: 59 %
Platelets: 281 10*3/uL (ref 150–400)
RBC: 4.2 MIL/uL (ref 3.87–5.11)
RDW: 14.1 % (ref 11.5–15.5)
WBC: 7.4 10*3/uL (ref 4.0–10.5)
nRBC: 0 % (ref 0.0–0.2)

## 2020-05-02 LAB — VITAMIN D 25 HYDROXY (VIT D DEFICIENCY, FRACTURES): Vit D, 25-Hydroxy: 40.87 ng/mL (ref 30–100)

## 2020-05-11 ENCOUNTER — Other Ambulatory Visit (HOSPITAL_COMMUNITY): Payer: 59

## 2020-05-15 ENCOUNTER — Other Ambulatory Visit: Payer: Self-pay | Admitting: Hematology

## 2020-05-15 ENCOUNTER — Ambulatory Visit
Admission: RE | Admit: 2020-05-15 | Discharge: 2020-05-15 | Disposition: A | Discharge status: Home / Self Care | Payer: 59 | Source: Ambulatory Visit | Attending: Hematology | Admitting: Hematology

## 2020-05-15 ENCOUNTER — Other Ambulatory Visit: Payer: Self-pay

## 2020-05-15 DIAGNOSIS — Z853 Personal history of malignant neoplasm of breast: Secondary | ICD-10-CM

## 2020-05-15 DIAGNOSIS — R921 Mammographic calcification found on diagnostic imaging of breast: Secondary | ICD-10-CM | POA: Diagnosis not present

## 2020-05-16 DIAGNOSIS — M722 Plantar fascial fibromatosis: Secondary | ICD-10-CM | POA: Diagnosis not present

## 2020-05-16 DIAGNOSIS — M79672 Pain in left foot: Secondary | ICD-10-CM | POA: Diagnosis not present

## 2020-05-22 ENCOUNTER — Ambulatory Visit (HOSPITAL_COMMUNITY): Payer: 59 | Admitting: Hematology

## 2020-05-23 ENCOUNTER — Ambulatory Visit
Admission: RE | Admit: 2020-05-23 | Discharge: 2020-05-23 | Disposition: A | Discharge status: Home / Self Care | Payer: 59 | Source: Ambulatory Visit | Attending: Hematology | Admitting: Hematology

## 2020-05-23 ENCOUNTER — Other Ambulatory Visit: Payer: Self-pay

## 2020-05-23 DIAGNOSIS — N6011 Diffuse cystic mastopathy of right breast: Secondary | ICD-10-CM | POA: Diagnosis not present

## 2020-05-23 DIAGNOSIS — N6019 Diffuse cystic mastopathy of unspecified breast: Secondary | ICD-10-CM

## 2020-05-23 DIAGNOSIS — R921 Mammographic calcification found on diagnostic imaging of breast: Secondary | ICD-10-CM

## 2020-05-23 HISTORY — PX: BREAST BIOPSY: SHX20

## 2020-05-23 HISTORY — DX: Diffuse cystic mastopathy of unspecified breast: N60.19

## 2020-05-26 ENCOUNTER — Other Ambulatory Visit (HOSPITAL_COMMUNITY): Payer: Self-pay | Admitting: Pharmacist

## 2020-05-30 ENCOUNTER — Other Ambulatory Visit: Payer: Self-pay

## 2020-05-30 ENCOUNTER — Inpatient Hospital Stay (HOSPITAL_COMMUNITY): Payer: 59 | Attending: Hematology | Admitting: Hematology

## 2020-05-30 VITALS — BP 116/73 | HR 93 | Temp 96.9°F | Resp 17 | Wt 189.3 lb

## 2020-05-30 DIAGNOSIS — Z79899 Other long term (current) drug therapy: Secondary | ICD-10-CM | POA: Insufficient documentation

## 2020-05-30 DIAGNOSIS — E8809 Other disorders of plasma-protein metabolism, not elsewhere classified: Secondary | ICD-10-CM | POA: Diagnosis not present

## 2020-05-30 DIAGNOSIS — D0512 Intraductal carcinoma in situ of left breast: Secondary | ICD-10-CM | POA: Diagnosis not present

## 2020-05-30 DIAGNOSIS — Z79810 Long term (current) use of selective estrogen receptor modulators (SERMs): Secondary | ICD-10-CM | POA: Insufficient documentation

## 2020-05-30 DIAGNOSIS — Z923 Personal history of irradiation: Secondary | ICD-10-CM | POA: Insufficient documentation

## 2020-05-30 DIAGNOSIS — F419 Anxiety disorder, unspecified: Secondary | ICD-10-CM | POA: Diagnosis not present

## 2020-05-30 NOTE — Patient Instructions (Signed)
[place] at [facility] Discharge Instructions  You were seen today by Dr. Delton Coombes. He went over your recent results and scans. Eat fruits daily to improve your potassium levels. You may resume with getting your mammograms once a year; your next mammogram will be scheduled after March 7th, 2023. Dr. Delton Coombes will see you back in 6 months for labs and follow up.   Thank you for choosing [place] at [facility] to provide your oncology and hematology care.  To afford each patient quality time with our provider, please arrive at least 15 minutes before your scheduled appointment time.   If you have a lab appointment with the [facility] please come in thru the Main Entrance and check in at the main information desk  You need to re-schedule your appointment should you arrive 10 or more minutes late.  We strive to give you quality time with our providers, and arriving late affects you and other patients whose appointments are after yours.  Also, if you no show three or more times for appointments you may be dismissed from the clinic at the providers discretion.     Again, thank you for choosing [facility].  Our hope is that these requests will decrease the amount of time that you wait before being seen by our physicians.       _____________________________________________________________  Should you have questions after your visit to [facility], please contact our office at [phone] between the hours of 8:00 a.m. and 4:30 p.m.  Voicemails left after 4:00 p.m. will not be returned until the following business day.  For prescription refill requests, have your pharmacy contact our office and allow 72 hours.    Cancer Center Support Programs:   > Cancer Support Group  2nd Tuesday of the month 1pm-2pm, Journey Room

## 2020-05-30 NOTE — Progress Notes (Signed)
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  The Common Hereditary Gene Panel offered by [facility] includes sequencing and/or deletion duplication testing of the following 48 genes: APC, ATM, AXIN2, BARD1, BMPR1A, BRCA1, BRCA2, BRIP1, CDH1, CDK4, CDKN2A (p14ARF), CDKN2A (p16INK4a), CHEK2, CTNNA1, DICER1, EPCAM (Deletion/duplication testing only), GREM1 (promoter region deletion/duplication testing only), KIT, MEN1, MLH1, MSH2, MSH3, MSH6, MUTYH, NBN, NF1, NHTL1, PALB2, PDGFRA, PMS2, POLD1, POLE, PTEN, RAD50, RAD51C, RAD51D, RNF43, SDHB, SDHC, SDHD, SMAD4, SMARCA4. STK11, TP53, TSC1, TSC2, and VHL.  The following genes were evaluated for sequence changes only: SDHA and HOXB13 c.251G>A variant only. The report date is October 30, 2018.     CHIEF COMPLIANT: Follow-up for left breast high-grade DCIS   INTERVAL HISTORY: [name] is a 50 y.o. female here today for follow up of her left breast high-grade DCIS. Her last visit was on 10/20/2019.   Today she reports feeling well. She had 2 biopsies, one on each breast, since her last visit which were both benign. Both were detected on mammogram. She is taking tamoxifen and tolerating it well. Her last episode of uterine spotting was in January. She is taking Xanax at bedtime as needed. She was sick with COVID and experienced a cough, excruciating headache lasting 3 days, and fatigue.    REVIEW OF SYSTEMS:   Review of Systems  Constitutional: Positive for fatigue (75%).  Negative for appetite change.  Cardiovascular: Positive for chest pain (R breast bruising since biopsy).  Genitourinary: Negative for vaginal bleeding.   Psychiatric/Behavioral: Positive for sleep disturbance (on Xanax PRN).  All other systems reviewed and are negative.   I have reviewed the past medical history, past surgical history, social history and family history with the patient and they are unchanged from previous note.   ALLERGIES:   is allergic to bee venom.   MEDICATIONS:  Current Outpatient Medications  Medication Sig Dispense Refill  . atorvastatin (LIPITOR) 20 MG tablet TAKE 1 Tablet BY MOUTH ONCE DAILY (Patient taking differently: Take 10 mg by mouth at bedtime.) 90 tablet 2  . CALCIUM PO Take 2,400 mg by mouth daily.    . fexofenadine (ALLEGRA) 180 MG tablet Take 180 mg by mouth at bedtime.    . hydrochlorothiazide (HYDRODIURIL) 25 MG tablet Take 25 mg by mouth daily.    . meclizine (ANTIVERT) 25 MG tablet Take 1 tablet (25 mg total) by mouth 3 (three) times daily as needed for dizziness. 30 tablet 0  . nystatin-triamcinolone (MYCOLOG II) cream Apply 1 application topically as needed.     Marland Kitchen olmesartan (BENICAR) 40 MG tablet Take 40 mg by mouth daily.    Marland Kitchen omeprazole (PRILOSEC) 40 MG capsule Take 1 capsule (40 mg total) by mouth daily. (Patient taking differently: Take 40 mg by mouth in the morning and at bedtime.) 90 capsule 1  . ondansetron (ZOFRAN-ODT) 4 MG disintegrating tablet Take 8 mg by mouth every 8 (eight) hours as needed.    . polyethylene glycol (MIRALAX / GLYCOLAX) 17 g packet Take 17 g by mouth daily.     Marland Kitchen  Probiotic Product (PROBIOTIC PO) Take by mouth daily.    . sucralfate (CARAFATE) 1 g tablet Take 1 g by mouth 3 (three) times daily.    . tamoxifen (NOLVADEX) 20 MG tablet Take 1 tablet (20 mg total) by mouth daily. 30 tablet 5  . ALPRAZolam (XANAX) 0.25 MG tablet Take 1 tablet (0.25 mg total) by mouth at bedtime as needed for anxiety. (Patient not  taking: Reported on 05/30/2020) 30 tablet 2   No current facility-administered medications for this visit.     PHYSICAL EXAMINATION: Performance status (ECOG): 0 - Asymptomatic  Vitals:   05/30/20 1438  BP: 116/73  Pulse: 93  Resp: 17  Temp: (!) 96.9 F (36.1 C)  SpO2: 100%   Wt Readings from Last 3 Encounters:  05/30/20 189 lb 4.8 oz (85.9 kg)  02/22/20 187 lb (84.8 kg)  02/18/20 186 lb 9.6 oz (84.6 kg)   Physical Exam Vitals reviewed.  Constitutional:      Appearance: Normal appearance.  Cardiovascular:     Rate and Rhythm: Normal rate and regular rhythm.     Pulses: Normal pulses.     Heart sounds: Normal heart sounds.  Pulmonary:     Effort: Pulmonary effort is normal.     Breath sounds: Normal breath sounds.  Chest:  Breasts:     Right: Tenderness (since biopsy) present. No swelling, bleeding, inverted nipple, mass, nipple discharge, skin change or supraclavicular adenopathy.     Left: Swelling (lymphedema) and skin change (scar thickening stable) present. No bleeding, inverted nipple, mass, nipple discharge, tenderness or supraclavicular adenopathy.     Abdominal:     Palpations: Abdomen is soft. There is mass (lipoma in LUQ). There is no hepatomegaly or splenomegaly.     Tenderness: There is no abdominal tenderness.     Hernia: No hernia is present.  Musculoskeletal:     Right lower leg: No edema.     Left lower leg: No edema.  Lymphadenopathy:     Cervical: No cervical adenopathy.     Upper Body:     Right upper body: No supraclavicular adenopathy.     Left upper body: No supraclavicular adenopathy.  Neurological:     General: No focal deficit present.     Mental Status: She is alert and oriented to person, place, and time.  Psychiatric:        Mood and Affect: Mood normal.        Behavior: Behavior normal.     Breast Exam Chaperone: Milinda Antis, MD     LABORATORY DATA:  I have reviewed the data as listed CMP Latest Ref Rng & Units  05/02/2020 10/14/2019 06/29/2019  Glucose 70 - 99 mg/dL 103(H) 102(H) 102(H)  BUN 6 - 20 mg/dL _0 Creatinine 0.44 - 1.00 mg/dL 0.52 0.82 0.54  Sodium 135 - 145 mmol/L 135 137 137  Potassium 3.5 - 5.1 mmol/L 3.4(L) 3.8 3.3(L)  Chloride 98 - 111 mmol/L 103 106 103  CO2 22 - 32 mmol/L 24 22 20(L)  Calcium 8.9 - 10.3 mg/dL 8.7(L) 8.8(L) 9.3  Total Protein 6.5 - 8.1 g/dL 6.4(L) 6.5 7.2  Total Bilirubin 0.3 - 1.2 mg/dL 0.2(L) 0.3 0.4  Alkaline Phos 38 - 126 U/L 43 48 50  AST 15 - 41 U/L 16 14(L) 18  ALT 0 - 44 U/L _1 No results found for: [id number] Lab Results  Component Value Date   WBC 7.4 05/02/2020   HGB 11.7 (L) 05/02/2020  HCT 36.2 05/02/2020   MCV 86.2 05/02/2020   PLT 281 05/02/2020   NEUTROABS 4.3 05/02/2020   Lab Results  Component Value Date   VD25OH 40.87 05/02/2020   VD25OH 38.42 10/14/2019   VD25OH 36.53 04/06/2019    ASSESSMENT:  1. Left breast high-grade DCIS: -Lumpectomy on 04/29/2018, 0.5 cm high-grade DCIS, ER 70%, PR 80%, margins negative, 0/1 lymph node positive, pTis PN 0. -She received radiation therapy. -Tamoxifen started on 05/12/2018. -Bilateral mammogram on 04/06/2019 showed left breast calcifications. -Left upper outer quadrant needle biopsy on 04/15/2018 shows benign breast tissue with calcifications.  2. Mild hypoproteinemia: -She has mild hypoalbuminemia dating back several years. She also has mild leg swellings. -She was instructed to eat high-protein diet.  3. Family history: -Mother had breast and lung cancer. Maternal aunt had breast cancer. Maternal uncle had colon cancer. Sister had anal cancer. -Genetic testing was negative.   PLAN:  1. Left breast high-grade DCIS: -She is tolerating tamoxifen reasonably well. -She had mammogram of the bilateral breasts on 05/15/2020 which showed indeterminate 4 mm group of calcifications in the upper outer quadrant of the right breast. -She had biopsy on 05/23/2020 which was consistent  with fibrocystic change with calcifications, usual ductal hyperplasia and sclerosing adenosis. -Endometrial biopsy on 02/14/2020 shows benign endometrial type polyp with proliferative endometrium with no hyperplasia or malignancy.  She denies any spotting at this time. -She will have next mammogram in 1 year. -We have reviewed her labs from 05/02/2020 which showed normal LFTs and a near normal CBC. -RTC 6 months for follow-up.  2.  Bone health: -Vitamin D is normal at 40.  Continue calcium and vitamin D supplements.  3.  Anxiety/sleeping difficulty: -Continue Xanax 0.25 mg at bedtime as needed.    Orders Placed This Encounter  Procedures  . CBC with Differential/Platelet    Standing Status:   Future    Standing Expiration Date:   05/30/2021    Order Specific Question:   Release to patient    Answer:   Immediate  . Comprehensive metabolic panel    Standing Status:   Future    Standing Expiration Date:   05/30/2021    Order Specific Question:   Release to patient    Answer:   Immediate  . VITAMIN D 25 Hydroxy (Vit-D Deficiency, Fractures)    Standing Status:   Future    Standing Expiration Date:   05/30/2021    Order Specific Question:   Release to patient    Answer:   Immediate   The patient has a good understanding of the overall plan. she agrees with it. she will call with any problems that may develop before the next visit here.    Derek Jack, MD [place] [phone]   I, Milinda Antis, am acting as a scribe for Dr. Sanda Linger.  I, Derek Jack MD, have reviewed the above documentation for accuracy and completeness, and I agree with the above.

## 2020-05-31 ENCOUNTER — Ambulatory Visit: Payer: 59 | Admitting: Gastroenterology

## 2020-05-31 ENCOUNTER — Encounter: Payer: Self-pay | Admitting: Gastroenterology

## 2020-05-31 DIAGNOSIS — K529 Noninfective gastroenteritis and colitis, unspecified: Secondary | ICD-10-CM

## 2020-05-31 NOTE — Patient Instructions (Signed)
Please let me know if you continue to have abdominal pain and discomfort, and we will set up an upper endoscopy.  Continue omeprazole once daily, 30 minutes before breakfast. Continue to avoid medications like Advil, Ibuprofen, Aleve, Motrin.  We will see you back in June 2022!  I enjoyed seeing you again today! As you know, I value our relationship and want to provide genuine, compassionate, and quality care. I welcome your feedback. If you receive a survey regarding your visit,  I greatly appreciate you taking time to fill this out. See you next time!  Annitta Needs, PhD, ANP-BC [facility]

## 2020-05-31 NOTE — Progress Notes (Signed)
Referring Provider: Celene Squibb, MD Primary Care Physician:  Celene Squibb, MD Primary GI: Dr. Gala Romney   Chief Complaint  Patient presents with  . Abdominal Pain    LUQ, comes/goes, achy feeling all the time  . Diarrhea    4-5 days watery, yellow, plae color. Starting to thicken up now    HPI:   Lorraine Peters is a 50 y.o. female presenting today with a history of GERD, multiple adenomas with surveillance due in 2023, abdominal pain s/p EGD in July 2019. RUQ [place] July 2019 with fatty liver, no gallstones.Last seen Dec 2021 with noting dyspepsia and was treated by PCP for H.pylori. Dyspepsia had improved with treatment. After treatment, we held PPI X 2 weeks then completed breath test for eradication, which was negative.   Had onset of abdominal pain, nausea, and diarrhea last week after breast biopsy. Had eaten at a [employer] and then at [place] that evening. Started having abdominal pain, nausea, and then watery diarrhea in the morning. Diarrhea lasted 4 days then thickened up and was actually constipated. Had to take Miralax. Nausea resolved. Now feels like eating back to normal last 2 days. Every now and then will have LUQ discomfort and middle abdomen intermittently but improved. Feels like she had done a bunch of sit ups.   No GERD. Still just taking one omeprazole. No NSAIDs. Since last visit Dec 2021, she had been doing well without abdominal pain, N/V. Had good appetite.    Past Medical History:  Diagnosis Date  . Breast cancer ([place])   . Breast disorder    left breast at age 54  . Family history of breast cancer   . Family history of colon cancer   . H. pylori infection   . History of kidney stones   . Hypercholesterolemia   . Hypertension   . Migraine   . Ovarian cyst   . Personal history of radiation therapy   . PONV (postoperative nausea and vomiting)   . Reflux     Past Surgical History:  Procedure Laterality Date  . BREAST BIOPSY Left 04/2019  . BREAST  LUMPECTOMY Left 2020  . COLONOSCOPY N/A 12/30/2018   six 4-7 mm polyps in rectum, descending colon, and ascending colon. Sigmoid and descending colon diverticulosis. Serrated and tubular adenoma. Surveillance 3 years.   . ESOPHAGOGASTRODUODENOSCOPY N/A 09/12/2017   normal esophagus, small hiatal hernia, large pedunculated duodenal polyp s/p resection and clip placement. Benign polyp  . PARTIAL MASTECTOMY WITH NEEDLE LOCALIZATION AND AXILLARY SENTINEL LYMPH NODE BX Left 04/29/2018   Procedure: PARTIAL MASTECTOMY WITH NEEDLE LOCALIZATION AND AXILLARY SENTINEL LYMPH NODE BX;  Surgeon: Virl Cagey, MD;  Location: [facility];  Service: General;  Laterality: Left;  . POLYPECTOMY  12/30/2018   Procedure: POLYPECTOMY;  Surgeon: Daneil Dolin, MD;  Location: [facility];  Service: Endoscopy;;  ascending,descending,rectal  . TUBAL LIGATION      Current Outpatient Medications  Medication Sig Dispense Refill  . ALPRAZolam (XANAX) 0.25 MG tablet Take 1 tablet (0.25 mg total) by mouth at bedtime as needed for anxiety. 30 tablet 2  . atorvastatin (LIPITOR) 20 MG tablet TAKE 1 Tablet BY MOUTH ONCE DAILY (Patient taking differently: Take 10 mg by mouth at bedtime.) 90 tablet 2  . CALCIUM PO Take 2,400 mg by mouth daily.    . fexofenadine (ALLEGRA) 180 MG tablet Take 180 mg by mouth at bedtime.    . hydrochlorothiazide (HYDRODIURIL) 25 MG tablet Take  25 mg by mouth daily.    . meclizine (ANTIVERT) 25 MG tablet Take 1 tablet (25 mg total) by mouth 3 (three) times daily as needed for dizziness. 30 tablet 0  . nystatin-triamcinolone (MYCOLOG II) cream Apply 1 application topically as needed.     Marland Kitchen olmesartan (BENICAR) 40 MG tablet Take 40 mg by mouth daily.    Marland Kitchen omeprazole (PRILOSEC) 40 MG capsule Take 1 capsule (40 mg total) by mouth daily. 90 capsule 1  . ondansetron (ZOFRAN-ODT) 4 MG disintegrating tablet Take 8 mg by mouth every 8 (eight) hours as needed.    . polyethylene glycol (MIRALAX / GLYCOLAX) 17 g  packet Take 17 g by mouth daily.     . Probiotic Product (PROBIOTIC PO) Take by mouth daily.    . tamoxifen (NOLVADEX) 20 MG tablet Take 1 tablet (20 mg total) by mouth daily. 30 tablet 5   No current facility-administered medications for this visit.    Allergies as of 05/31/2020 - Review Complete 05/31/2020  Allergen Reaction Noted  . Bee venom Swelling and Other (See Comments) 04/29/2011    Family History  Problem Relation Age of Onset  . Arthritis Mother   . Cancer Mother        breast  . Breast cancer Mother 32       recurrance at 34  . COPD Father   . Heart disease Father   . Hyperlipidemia Father   . Hypertension Father   . Cancer Sister 76       anal, deceased due to mva but had advanced disease  . Stroke Maternal Grandfather   . Breast cancer Maternal Aunt 58    Social History   Socioeconomic History  . Marital status: Divorced    Spouse name: Not on file  . Number of children: 1  . Years of education: Not on file  . Highest education level: Not on file  Occupational History  . Not on file  Tobacco Use  . Smoking status: Former Smoker    Packs/day: 0.50    Years: 28.00    Pack years: 14.00    Types: Cigarettes    Quit date: 04/22/2018    Years since quitting: 2.1  . Smokeless tobacco: Never Used  Vaping Use  . Vaping Use: Never used  Substance and Sexual Activity  . Alcohol use: No    Alcohol/week: 0.0 standard drinks  . Drug use: No  . Sexual activity: Not Currently    Birth control/protection: Surgical    Comment: tubal  Other Topics Concern  . Not on file  Social History Narrative  . Not on file   Social Determinants of Health   Financial Resource Strain: Low Risk   . Difficulty of Paying Living Expenses: Not very hard  Food Insecurity: No Food Insecurity  . Worried About Charity fundraiser in the Last Year: Never true  . Ran Out of Food in the Last Year: Never true  Transportation Needs: No Transportation Needs  . Lack of  Transportation (Medical): No  . Lack of Transportation (Non-Medical): No  Physical Activity: Insufficiently Active  . Days of Exercise per Week: 3 days  . Minutes of Exercise per Session: 30 min  Stress: No Stress Concern Present  . Feeling of Stress : Only a little  Social Connections: Moderately Isolated  . Frequency of Communication with Friends and Family: More than three times a week  . Frequency of Social Gatherings with Friends and Family: Twice a week  .  Attends Religious Services: More than 4 times per year  . Active Member of Clubs or Organizations: No  . Attends Archivist Meetings: Never  . Marital Status: Divorced    Review of Systems: Gen: Denies fever, chills, anorexia. Denies fatigue, weakness, weight loss.  CV: Denies chest pain, palpitations, syncope, peripheral edema, and claudication. Resp: Denies dyspnea at rest, cough, wheezing, coughing up blood, and pleurisy. GI: see HPI Derm: Denies rash, itching, dry skin Psych: Denies depression, anxiety, memory loss, confusion. No homicidal or suicidal ideation.  Heme: Denies bruising, bleeding, and enlarged lymph nodes.  Physical Exam: BP 108/67   Pulse 83   Temp (!) 97.5 F (36.4 C)   Ht 5\' 7"  (1.702 m)   Wt 190 lb (86.2 kg)   LMP 04/04/2019 (Approximate)   BMI 29.76 kg/m  General:   Alert and oriented. No distress noted. Pleasant and cooperative.  Head:  Normocephalic and atraumatic. Eyes:  Conjuctiva clear without scleral icterus. Mouth:  Mask in place Abdomen:  +BS, soft, mild TTP upper abdomen and non-distended. No rebound or guarding. No HSM or masses noted. Msk:  Symmetrical without gross deformities. Normal posture. Extremities:  Without edema. Neurologic:  Alert and  oriented x4 Psych:  Alert and cooperative. Normal mood and affect.  ASSESSMENT: Lorraine Peters is a 50 y.o. female presenting today with a history of GERD, multiple adenomas with surveillance due in 2023, abdominal pain s/p EGD  in July 2019. RUQ [place] July 2019 with fatty liver, no gallstones.Last seen Dec 2021 with noting dyspepsia and was treated by PCP for H.pylori. Dyspepsia had improved with treatment. After treatment, we held PPI X 2 weeks then completed breath test for eradication, which was negative.   Since Dec 2021, she has done well on once daily omeprazole (decreased from BID). Last week had acute onset of abdominal pain, diarrhea, and nausea after eating at a [employer]. Symptoms most likely due to self-limiting gastroenteritis, as she is now much improved. Still with mild epigastric discomfort that started at time of acute illness but notably improved.  We discussed following clinically and pursuing EGD only if persistent dyspepsia. Currently, appears illness was self-limiting and she is having notable improvement.     PLAN:  Omeprazole daily  Call if abdominal pain worsens or persists  Keep appt for June  Colonoscopy 2023  Annitta Needs, PhD, [facility] Gastroenterology

## 2020-06-07 DIAGNOSIS — D0512 Intraductal carcinoma in situ of left breast: Secondary | ICD-10-CM | POA: Diagnosis not present

## 2020-06-07 DIAGNOSIS — Z170 Estrogen receptor positive status [ER+]: Secondary | ICD-10-CM | POA: Diagnosis not present

## 2020-06-10 ENCOUNTER — Other Ambulatory Visit (HOSPITAL_COMMUNITY): Payer: Self-pay

## 2020-06-12 ENCOUNTER — Other Ambulatory Visit (HOSPITAL_COMMUNITY): Payer: Self-pay

## 2020-06-12 DIAGNOSIS — I10 Essential (primary) hypertension: Secondary | ICD-10-CM | POA: Diagnosis not present

## 2020-06-12 DIAGNOSIS — Z853 Personal history of malignant neoplasm of breast: Secondary | ICD-10-CM | POA: Diagnosis not present

## 2020-06-12 MED ORDER — OLMESARTAN MEDOXOMIL 40 MG PO TABS
40.0000 mg | ORAL_TABLET | Freq: Every morning | ORAL | 2 refills | Status: DC
  Filled 2020-06-12 – 2020-06-26 (×2): qty 90, 90d supply, fill #0

## 2020-06-12 MED ORDER — HYDROCHLOROTHIAZIDE 25 MG PO TABS
25.0000 mg | ORAL_TABLET | Freq: Every morning | ORAL | 2 refills | Status: DC
Start: 2020-06-12 — End: 2021-07-19
  Filled 2020-06-12 – 2020-10-23 (×4): qty 90, 90d supply, fill #0
  Filled 2020-12-21 – 2021-01-01 (×2): qty 90, 90d supply, fill #1
  Filled 2021-04-23: qty 90, 90d supply, fill #2

## 2020-06-16 ENCOUNTER — Other Ambulatory Visit (HOSPITAL_COMMUNITY): Payer: Self-pay

## 2020-06-20 ENCOUNTER — Other Ambulatory Visit (HOSPITAL_COMMUNITY): Payer: Self-pay

## 2020-06-22 DIAGNOSIS — Z1283 Encounter for screening for malignant neoplasm of skin: Secondary | ICD-10-CM | POA: Diagnosis not present

## 2020-06-22 DIAGNOSIS — D225 Melanocytic nevi of trunk: Secondary | ICD-10-CM | POA: Diagnosis not present

## 2020-06-23 ENCOUNTER — Other Ambulatory Visit (HOSPITAL_COMMUNITY): Payer: Self-pay

## 2020-06-26 ENCOUNTER — Other Ambulatory Visit: Payer: Self-pay | Admitting: Physician Assistant

## 2020-06-26 ENCOUNTER — Other Ambulatory Visit (HOSPITAL_COMMUNITY): Payer: Self-pay

## 2020-06-26 ENCOUNTER — Other Ambulatory Visit (HOSPITAL_COMMUNITY): Payer: Self-pay | Admitting: Hematology

## 2020-06-26 ENCOUNTER — Other Ambulatory Visit: Payer: Self-pay | Admitting: Gastroenterology

## 2020-06-26 DIAGNOSIS — R110 Nausea: Secondary | ICD-10-CM

## 2020-06-26 DIAGNOSIS — R1013 Epigastric pain: Secondary | ICD-10-CM

## 2020-06-26 DIAGNOSIS — D0512 Intraductal carcinoma in situ of left breast: Secondary | ICD-10-CM

## 2020-06-26 DIAGNOSIS — K219 Gastro-esophageal reflux disease without esophagitis: Secondary | ICD-10-CM

## 2020-06-26 MED ORDER — TAMOXIFEN CITRATE 20 MG PO TABS
20.0000 mg | ORAL_TABLET | Freq: Every day | ORAL | 5 refills | Status: DC
  Filled 2020-06-26: qty 30, 30d supply, fill #0
  Filled 2020-07-24: qty 30, 30d supply, fill #1
  Filled 2020-08-20: qty 30, 30d supply, fill #2
  Filled 2020-09-26: qty 30, 30d supply, fill #3
  Filled 2020-10-23: qty 30, 30d supply, fill #4
  Filled 2020-11-21: qty 18, 18d supply, fill #5
  Filled 2020-11-21: qty 12, 12d supply, fill #5

## 2020-06-26 MED ORDER — ATORVASTATIN CALCIUM 20 MG PO TABS
20.0000 mg | ORAL_TABLET | Freq: Every day | ORAL | 0 refills | Status: DC
  Filled 2020-06-26: qty 90, 90d supply, fill #0

## 2020-06-27 ENCOUNTER — Other Ambulatory Visit (HOSPITAL_COMMUNITY): Payer: Self-pay

## 2020-06-27 MED ORDER — FLUTICASONE PROPIONATE 50 MCG/ACT NA SUSP
1.0000 | Freq: Every day | NASAL | 4 refills | Status: DC
Start: 2019-11-02 — End: 2021-05-29
  Filled 2020-06-27: qty 16, 60d supply, fill #0

## 2020-06-29 ENCOUNTER — Telehealth: Payer: Self-pay | Admitting: Internal Medicine

## 2020-06-29 ENCOUNTER — Other Ambulatory Visit (HOSPITAL_COMMUNITY): Payer: Self-pay

## 2020-06-29 NOTE — Telephone Encounter (Signed)
Pt called to ask if Roseanne Kaufman, NP would write her a prescription of Omeprazole and sent it to [facility]. [place]

## 2020-07-05 ENCOUNTER — Other Ambulatory Visit (HOSPITAL_COMMUNITY): Payer: Self-pay

## 2020-07-05 MED ORDER — OMEPRAZOLE 40 MG PO CPDR
40.0000 mg | DELAYED_RELEASE_CAPSULE | Freq: Every day | ORAL | 1 refills | Status: DC
  Filled 2020-07-05: qty 90, 90d supply, fill #0
  Filled 2020-09-26: qty 90, 90d supply, fill #1

## 2020-07-24 ENCOUNTER — Other Ambulatory Visit (HOSPITAL_COMMUNITY): Payer: Self-pay

## 2020-07-24 MED ORDER — CARESTART COVID-19 HOME TEST VI KIT
PACK | 0 refills | Status: DC
  Filled 2020-07-24: qty 2, 2d supply, fill #0

## 2020-07-24 MED FILL — Hydrochlorothiazide Tab 25 MG: ORAL | 90 days supply | Qty: 90 | Fill #0 | Status: AC

## 2020-08-21 ENCOUNTER — Other Ambulatory Visit (HOSPITAL_COMMUNITY): Payer: Self-pay

## 2020-08-23 ENCOUNTER — Ambulatory Visit (INDEPENDENT_AMBULATORY_CARE_PROVIDER_SITE_OTHER): Payer: 59 | Admitting: Gastroenterology

## 2020-08-23 ENCOUNTER — Encounter: Payer: Self-pay | Admitting: Gastroenterology

## 2020-08-23 ENCOUNTER — Other Ambulatory Visit: Payer: Self-pay

## 2020-08-23 VITALS — BP 127/75 | HR 72 | Temp 97.5°F | Ht 67.0 in | Wt 201.6 lb

## 2020-08-23 DIAGNOSIS — K625 Hemorrhage of anus and rectum: Secondary | ICD-10-CM | POA: Diagnosis not present

## 2020-08-23 DIAGNOSIS — K219 Gastro-esophageal reflux disease without esophagitis: Secondary | ICD-10-CM | POA: Diagnosis not present

## 2020-08-23 NOTE — Patient Instructions (Signed)
Continue to avoid straining, limit toilet time, and avoid constipation.  Please call me if persistent bleeding!  Otherwise, we will see you in 6 months!  Your next colonoscopy is due in

## 2020-08-23 NOTE — Progress Notes (Signed)
Referring Provider: Celene Squibb, MD Primary Care Physician:  Pablo Lawrence, NP Primary GI: Dr. Gala Romney  Chief Complaint  Patient presents with   Rectal Bleeding    Had 2 small episodes since last visit    HPI:   Lorraine Peters is a 50 y.o. female presenting today with a history of GERD, multiple adenomas with surveillance due in 2023, abdominal pain s/p EGD in July 2019. RUQ [place] July 2019 with fatty liver, no gallstones. Treated for H.pylori by PCP last year with breat test eradication documented.   Miralax every night. Had one episode of constipation then had 2 episodes of scant bleeding, rectal itching. No rectal itching or burning now. No rectal pain. No prolonged toilet time. No abdominal pain, N/V. No GERD exacerbation. Doing well now.   Past Medical History:  Diagnosis Date   Breast cancer ([place])    Breast disorder    left breast at age 40   Family history of breast cancer    Family history of colon cancer    H. pylori infection    History of kidney stones    Hypercholesterolemia    Hypertension    Migraine    Ovarian cyst    Personal history of radiation therapy    PONV (postoperative nausea and vomiting)    Reflux     Past Surgical History:  Procedure Laterality Date   BREAST BIOPSY Left 04/2019   BREAST LUMPECTOMY Left 2020   COLONOSCOPY N/A 12/30/2018   six 4-7 mm polyps in rectum, descending colon, and ascending colon. Sigmoid and descending colon diverticulosis. Serrated and tubular adenoma. Surveillance 3 years.    ESOPHAGOGASTRODUODENOSCOPY N/A 09/12/2017   normal esophagus, small hiatal hernia, large pedunculated duodenal polyp s/p resection and clip placement. Benign polyp   PARTIAL MASTECTOMY WITH NEEDLE LOCALIZATION AND AXILLARY SENTINEL LYMPH NODE BX Left 04/29/2018   Procedure: PARTIAL MASTECTOMY WITH NEEDLE LOCALIZATION AND AXILLARY SENTINEL LYMPH NODE BX;  Surgeon: Virl Cagey, MD;  Location: [facility];  Service: General;  Laterality: Left;    POLYPECTOMY  12/30/2018   Procedure: POLYPECTOMY;  Surgeon: Daneil Dolin, MD;  Location: [facility];  Service: Endoscopy;;  ascending,descending,rectal   TUBAL LIGATION      Current Outpatient Medications  Medication Sig Dispense Refill   ALPRAZolam (XANAX) 0.25 MG tablet Take 1 tablet (0.25 mg total) by mouth at bedtime as needed for anxiety. 30 tablet 2   atorvastatin (LIPITOR) 20 MG tablet TAKE 1 Tablet BY MOUTH ONCE DAILY (Patient taking differently: Take 10 mg by mouth at bedtime.) 90 tablet 2   CALCIUM PO Take 2,400 mg by mouth daily.     fexofenadine (ALLEGRA) 180 MG tablet Take 180 mg by mouth at bedtime.     fluticasone (FLONASE) 50 MCG/ACT nasal spray Place 1 spray into both nostrils daily. (Patient taking differently: Place 1 spray into both nostrils as needed.) 16 g 4   hydrochlorothiazide (HYDRODIURIL) 25 MG tablet Take one tablet (25 mg dose) by mouth in the morning. (Patient taking differently: Take 25 mg by mouth daily.) 90 tablet 2   meclizine (ANTIVERT) 25 MG tablet TAKE 1 TABLET BY MOUTH EVERY 6 HOURS AS NEEDED FOR DIZZINESS. 45 tablet 0   olmesartan (BENICAR) 40 MG tablet Take 40 mg by mouth daily.     omeprazole (PRILOSEC) 40 MG capsule Take 1 capsule (40 mg total) by mouth daily. 90 capsule 1   ondansetron (ZOFRAN-ODT) 4 MG disintegrating tablet Take 8 mg by  mouth every 8 (eight) hours as needed.     polyethylene glycol (MIRALAX / GLYCOLAX) 17 g packet Take 17 g by mouth daily.      Probiotic Product (PROBIOTIC PO) Take by mouth daily.     tamoxifen (NOLVADEX) 20 MG tablet TAKE 1 TABLET (20 MG TOTAL) BY MOUTH DAILY. 30 tablet 5   No current facility-administered medications for this visit.    Allergies as of 08/23/2020 - Review Complete 08/23/2020  Allergen Reaction Noted   Bee venom Swelling and Other (See Comments) 04/29/2011    Family History  Problem Relation Age of Onset   Arthritis Mother    Cancer Mother        breast   Breast cancer Mother 18        recurrance at 39   COPD Father    Heart disease Father    Hyperlipidemia Father    Hypertension Father    Cancer Sister 35       anal, deceased due to mva but had advanced disease   Stroke Maternal Grandfather    Breast cancer Maternal Aunt 58    Social History   Socioeconomic History   Marital status: Divorced    Spouse name: Not on file   Number of children: 1   Years of education: Not on file   Highest education level: Not on file  Occupational History   Not on file  Tobacco Use   Smoking status: Former    Packs/day: 0.50    Years: 28.00    Pack years: 14.00    Types: Cigarettes    Quit date: 04/22/2018    Years since quitting: 2.3   Smokeless tobacco: Never  Vaping Use   Vaping Use: Never used  Substance and Sexual Activity   Alcohol use: No    Alcohol/week: 0.0 standard drinks   Drug use: No   Sexual activity: Not Currently    Birth control/protection: Surgical    Comment: tubal  Other Topics Concern   Not on file  Social History Narrative   Not on file   Social Determinants of Health   Financial Resource Strain: Low Risk    Difficulty of Paying Living Expenses: Not very hard  Food Insecurity: No Food Insecurity   Worried About Charity fundraiser in the Last Year: Never true   Ran Out of Food in the Last Year: Never true  Transportation Needs: No Transportation Needs   Lack of Transportation (Medical): No   Lack of Transportation (Non-Medical): No  Physical Activity: Insufficiently Active   Days of Exercise per Week: 3 days   Minutes of Exercise per Session: 30 min  Stress: No Stress Concern Present   Feeling of Stress : Only a little  Social Connections: Moderately Isolated   Frequency of Communication with Friends and Family: More than three times a week   Frequency of Social Gatherings with Friends and Family: Twice a week   Attends Religious Services: More than 4 times per year   Active Member of [employer] or Organizations: No   Attends Theatre manager Meetings: Never   Marital Status: Divorced    Review of Systems: Gen: Denies fever, chills, anorexia. Denies fatigue, weakness, weight loss.  CV: Denies chest pain, palpitations, syncope, peripheral edema, and claudication. Resp: Denies dyspnea at rest, cough, wheezing, coughing up blood, and pleurisy. GI: see HPI Derm: Denies rash, itching, dry skin Psych: Denies depression, anxiety, memory loss, confusion. No homicidal or suicidal ideation.  Heme: Denies  bruising, bleeding, and enlarged lymph nodes.  Physical Exam: BP 127/75   Pulse 72   Temp (!) 97.5 F (36.4 C) (Temporal)   Ht 5\' 7"  (1.702 m)   Wt 201 lb 9.6 oz (91.4 kg)   LMP 04/04/2019 (Approximate)   BMI 31.58 kg/m  General:   Alert and oriented. No distress noted. Pleasant and cooperative.  Head:  Normocephalic and atraumatic. Eyes:  Conjuctiva clear without scleral icterus. Mouth:  mask in place Rectal: no external hemorrhoids or fissure. Internal exam without mass, likely left lateral and right posterior engorged internal hemorrhoid columns  Msk:  Symmetrical without gross deformities. Normal posture. Extremities:  Without edema. Neurologic:  Alert and  oriented x4 Psych:  Alert and cooperative. Normal mood and affect.  ASSESSMENT/PLAN: DAWNMARIE BREON is a 50 y.o. female presenting today  with a history of GERD, multiple adenomas with surveillance due in 2023, and recent rectal bleeding.   Rectal bleeding most likely due to internal hemorrhoids in setting of constipation. DRE today with concerning findings and do note prominence likely in left lateral and right posterior columns.   Continue to avoid straining, limit toilet time, and call if any further episodes. She has had resolution of symptoms after 2 small episodes; low threshold for early interval colonoscopy if recurrent.   GERD remains well-controlled on omeprazole daily.    Annitta Needs, PhD, ANP-BC [facility] Gastroenterology

## 2020-09-26 ENCOUNTER — Other Ambulatory Visit (HOSPITAL_COMMUNITY): Payer: Self-pay

## 2020-09-26 MED ORDER — OLMESARTAN MEDOXOMIL 40 MG PO TABS
40.0000 mg | ORAL_TABLET | Freq: Every morning | ORAL | 2 refills | Status: DC
  Filled 2020-09-26: qty 90, 90d supply, fill #0

## 2020-09-27 ENCOUNTER — Other Ambulatory Visit (HOSPITAL_COMMUNITY): Payer: Self-pay

## 2020-09-27 DIAGNOSIS — J069 Acute upper respiratory infection, unspecified: Secondary | ICD-10-CM | POA: Diagnosis not present

## 2020-09-28 ENCOUNTER — Other Ambulatory Visit: Payer: 59 | Admitting: Adult Health

## 2020-09-29 ENCOUNTER — Other Ambulatory Visit (HOSPITAL_COMMUNITY): Payer: Self-pay

## 2020-10-03 ENCOUNTER — Other Ambulatory Visit (HOSPITAL_COMMUNITY): Payer: Self-pay

## 2020-10-09 ENCOUNTER — Other Ambulatory Visit (HOSPITAL_COMMUNITY): Payer: Self-pay

## 2020-10-09 DIAGNOSIS — Z0000 Encounter for general adult medical examination without abnormal findings: Secondary | ICD-10-CM | POA: Diagnosis not present

## 2020-10-09 DIAGNOSIS — R7303 Prediabetes: Secondary | ICD-10-CM | POA: Diagnosis not present

## 2020-10-09 DIAGNOSIS — H8113 Benign paroxysmal vertigo, bilateral: Secondary | ICD-10-CM | POA: Diagnosis not present

## 2020-10-09 DIAGNOSIS — Z853 Personal history of malignant neoplasm of breast: Secondary | ICD-10-CM | POA: Diagnosis not present

## 2020-10-09 DIAGNOSIS — I10 Essential (primary) hypertension: Secondary | ICD-10-CM | POA: Diagnosis not present

## 2020-10-09 DIAGNOSIS — J321 Chronic frontal sinusitis: Secondary | ICD-10-CM | POA: Diagnosis not present

## 2020-10-09 DIAGNOSIS — E782 Mixed hyperlipidemia: Secondary | ICD-10-CM | POA: Diagnosis not present

## 2020-10-09 MED ORDER — FLUTICASONE PROPIONATE 50 MCG/ACT NA SUSP
NASAL | 5 refills | Status: DC
  Filled 2020-10-09: qty 16, 30d supply, fill #0
  Filled 2021-02-05: qty 16, 30d supply, fill #1

## 2020-10-23 ENCOUNTER — Other Ambulatory Visit (HOSPITAL_COMMUNITY): Payer: Self-pay

## 2020-10-26 ENCOUNTER — Other Ambulatory Visit: Payer: Self-pay

## 2020-10-26 ENCOUNTER — Other Ambulatory Visit: Payer: 59 | Admitting: Women's Health

## 2020-10-26 ENCOUNTER — Encounter: Payer: Self-pay | Admitting: Obstetrics & Gynecology

## 2020-10-26 ENCOUNTER — Ambulatory Visit (INDEPENDENT_AMBULATORY_CARE_PROVIDER_SITE_OTHER): Payer: 59 | Admitting: Obstetrics & Gynecology

## 2020-10-26 VITALS — BP 135/83 | HR 83 | Ht 67.0 in | Wt 199.4 lb

## 2020-10-26 DIAGNOSIS — R102 Pelvic and perineal pain: Secondary | ICD-10-CM

## 2020-10-26 DIAGNOSIS — N840 Polyp of corpus uteri: Secondary | ICD-10-CM | POA: Diagnosis not present

## 2020-10-26 DIAGNOSIS — Z01411 Encounter for gynecological examination (general) (routine) with abnormal findings: Secondary | ICD-10-CM

## 2020-10-26 NOTE — Progress Notes (Signed)
WELL-WOMAN EXAMINATION Patient name: Lorraine Peters MRN [id number]  Date of birth: [birth year] Chief Complaint:   Gynecologic Exam  History of Present Illness:   Lorraine Peters is a 50 y.o. G47P0101 PM female with h/o breast cancer on tamoxifen being seen today for a routine well-woman exam.  Today she notes the following concerns:  Pelvic pain: Notes that about a week ago, she had noted pain on her left side. Lasted for a few days, radiated around to her back and then resolved.  Denies pain currently.  She is concerned about her ovarian cyst.  Prior US reviewed: 7.9 anteverted uterus, 8.80m endometrium. normal right ovary,two small simple left ovarian cysts (#1) 1.5 x 1.4 x 1.4 cm,(#2) 1.7 x .9 x 1.6 cm,ovaries appear mobile,left adnexal pain during ultrasound,no free fluid  Previously seen 02/2020- Dr. EElonda Husky benign polyp by E[facility] 09/2019- Pap neg, HPV neg   Patient's last menstrual period was 04/04/2019 (approximate).  Last pap 09/2019 neg.  Last mammogram: 05/2020 neg. Last colonoscopy: completed FOBT 2021  [facility] 10/26/2020 09/28/2019 05/05/2019  Decreased Interest 0 0 0  Down, Depressed, Hopeless 0 0 0  PHQ - 2 Score 0 0 0  Altered sleeping 0 0 -  Tired, decreased energy 0 0 -  Change in appetite 0 0 -  Feeling bad or failure about yourself  0 0 -  Trouble concentrating 0 0 -  Moving slowly or fidgety/restless 0 0 -  Suicidal thoughts 0 0 -  PHQ-9 Score 0 0 -    Review of Systems:   Pertinent items are noted in HPI Denies any headaches, blurred vision, fatigue, shortness of breath, chest pain, abdominal pain, bowel movements, urination, or intercourse unless otherwise stated above.  Pertinent History Reviewed:  Reviewed past medical,surgical, social and family history.  Reviewed problem list, medications and allergies. Physical Assessment:   Vitals:   10/26/20 1109  BP: 135/83  Pulse: 83  Weight: 199 lb 6.4 oz (90.4 kg)  Height: '5\' 7"'$  (1.702 m)  Body  mass index is 31.23 kg/m.        Physical Examination:   General appearance - well appearing, and in no distress  Mental status - alert, oriented to person, place, and time  Psych:  She has a normal mood and affect  Skin - warm and dry, normal color, no suspicious lesions noted  Chest - effort normal, all lung fields clear to auscultation bilaterally  Heart - normal rate and regular rhythm  Neck:  midline trachea, no thyromegaly or nodules  Breasts - breasts appear normal, no suspicious masses, no skin or nipple changes or  axillary nodes  Abdomen - soft, nontender, nondistended, no masses or organomegaly  Pelvic - VULVA: normal appearing vulva with no masses, tenderness or lesions  VAGINA: normal appearing vagina with normal color and discharge, no lesions  CERVIX: normal appearing cervix without discharge or lesions, no CMT  UTERUS: uterus is felt to be normal size, shape, consistency and nontender   ADNEXA: No adnexal masses or tenderness noted.  Extremities:  No swelling or varicosities noted  Chaperone: ACelene Squibb    Assessment & Plan:  1) Well-Woman Exam Pap and mammogram up to date  2) Pelvic pain -no abnormalities noted on exam -reviewed prior [facility] showed small ovarian cyst -plan for repeat pelvic [facility]oreato re-evaluate  3) Endometrial polyp -reviewed her concerns regarding low risk of endometrial cancer and based on prior note, it appears that prior plan  was monitoring of symptoms.   -Pt is now reconsidering-reviewed risk/benefit pt is leaning towards surgical intervention- plan for [facility] to re-evaluate for polpy, further management pending results   Meds: No orders of the defined types were placed in this encounter.   Follow-up: Return for [facility] next available with Amber/Dr. Nelda Marseille.   Janyth Pupa, DO Attending [place], [facility] for [employer], [place]

## 2020-11-14 DIAGNOSIS — H93293 Other abnormal auditory perceptions, bilateral: Secondary | ICD-10-CM | POA: Diagnosis not present

## 2020-11-17 ENCOUNTER — Other Ambulatory Visit: Payer: Self-pay | Admitting: Obstetrics & Gynecology

## 2020-11-17 ENCOUNTER — Other Ambulatory Visit (HOSPITAL_COMMUNITY)
Admission: RE | Admit: 2020-11-17 | Discharge: 2020-11-17 | Disposition: A | Discharge status: Home / Self Care | Payer: 59 | Source: Ambulatory Visit | Attending: Hematology | Admitting: Hematology

## 2020-11-17 DIAGNOSIS — N950 Postmenopausal bleeding: Secondary | ICD-10-CM

## 2020-11-17 DIAGNOSIS — D0512 Intraductal carcinoma in situ of left breast: Secondary | ICD-10-CM | POA: Diagnosis not present

## 2020-11-17 LAB — CBC WITH DIFFERENTIAL/PLATELET
Abs Immature Granulocytes: 0.01 10*3/uL (ref 0.00–0.07)
Basophils Absolute: 0 10*3/uL (ref 0.0–0.1)
Basophils Relative: 0 %
Eosinophils Absolute: 0 10*3/uL (ref 0.0–0.5)
Eosinophils Relative: 1 %
HCT: 36.9 % (ref 36.0–46.0)
Hemoglobin: 11.8 g/dL — ABNORMAL LOW (ref 12.0–15.0)
Immature Granulocytes: 0 %
Lymphocytes Relative: 28 %
Lymphs Abs: 1.8 10*3/uL (ref 0.7–4.0)
MCH: 26.8 pg (ref 26.0–34.0)
MCHC: 32 g/dL (ref 30.0–36.0)
MCV: 83.7 fL (ref 80.0–100.0)
Monocytes Absolute: 0.4 10*3/uL (ref 0.1–1.0)
Monocytes Relative: 6 %
Neutro Abs: 4.3 10*3/uL (ref 1.7–7.7)
Neutrophils Relative %: 65 %
Platelets: 244 10*3/uL (ref 150–400)
RBC: 4.41 MIL/uL (ref 3.87–5.11)
RDW: 15 % (ref 11.5–15.5)
WBC: 6.5 10*3/uL (ref 4.0–10.5)
nRBC: 0 % (ref 0.0–0.2)

## 2020-11-17 LAB — COMPREHENSIVE METABOLIC PANEL
ALT: 16 U/L (ref 0–44)
AST: 17 U/L (ref 15–41)
Albumin: 3.4 g/dL — ABNORMAL LOW (ref 3.5–5.0)
Alkaline Phosphatase: 51 U/L (ref 38–126)
Anion gap: 6 (ref 5–15)
BUN: 14 mg/dL (ref 6–20)
CO2: 26 mmol/L (ref 22–32)
Calcium: 8.4 mg/dL — ABNORMAL LOW (ref 8.9–10.3)
Chloride: 104 mmol/L (ref 98–111)
Creatinine, Ser: 0.52 mg/dL (ref 0.44–1.00)
GFR, Estimated: >60 mL/min (ref >60)
Glucose, Bld: 120 mg/dL — ABNORMAL HIGH (ref 70–99)
Potassium: 3.7 mmol/L (ref 3.5–5.1)
Sodium: 136 mmol/L (ref 135–145)
Total Bilirubin: 0.2 mg/dL — ABNORMAL LOW (ref 0.3–1.2)
Total Protein: 6.3 g/dL — ABNORMAL LOW (ref 6.5–8.1)

## 2020-11-17 LAB — VITAMIN D 25 HYDROXY (VIT D DEFICIENCY, FRACTURES): Vit D, 25-Hydroxy: 48.21 ng/mL (ref 30–100)

## 2020-11-20 ENCOUNTER — Ambulatory Visit (INDEPENDENT_AMBULATORY_CARE_PROVIDER_SITE_OTHER): Payer: 59 | Admitting: Obstetrics & Gynecology

## 2020-11-20 ENCOUNTER — Other Ambulatory Visit: Payer: Self-pay

## 2020-11-20 ENCOUNTER — Ambulatory Visit (INDEPENDENT_AMBULATORY_CARE_PROVIDER_SITE_OTHER): Payer: 59

## 2020-11-20 ENCOUNTER — Encounter: Payer: Self-pay | Admitting: Obstetrics & Gynecology

## 2020-11-20 VITALS — BP 137/86 | HR 71 | Ht 67.0 in | Wt 197.0 lb

## 2020-11-20 DIAGNOSIS — N83201 Unspecified ovarian cyst, right side: Secondary | ICD-10-CM | POA: Diagnosis not present

## 2020-11-20 DIAGNOSIS — Z853 Personal history of malignant neoplasm of breast: Secondary | ICD-10-CM

## 2020-11-20 DIAGNOSIS — R102 Pelvic and perineal pain: Secondary | ICD-10-CM

## 2020-11-20 DIAGNOSIS — R9389 Abnormal findings on diagnostic imaging of other specified body structures: Secondary | ICD-10-CM

## 2020-11-20 DIAGNOSIS — N83202 Unspecified ovarian cyst, left side: Secondary | ICD-10-CM

## 2020-11-20 DIAGNOSIS — N950 Postmenopausal bleeding: Secondary | ICD-10-CM

## 2020-11-20 NOTE — Progress Notes (Signed)
GYN VISIT Patient name: Lorraine Peters MRN [id number]  Date of birth: 10-Nov-1970 Chief Complaint:   Follow-up ([place] today)  History of Present Illness:   Lorraine Peters is a 50 y.o. G1P0101 postmenopausal female being seen today for US/SHG and follow up regarding:  -Pelvic pain: Today she notes that she still on occasion has pain on the left side.  Does not feel as though the pain interferes with her day to day.  She did note the discomfort during the ultrasound. Denies nausea/vomiting.  Denies F/C or other acute complaints.     -Uterine polyp- Last [place] 02/2020: 7.9 anteverted uterus, 8.32m endometrium. normal right ovary,two small simple left ovarian cysts (#1) 1.5 x 1.4 x 1.4 cm,(#2) 1.7 x .9 x 1.6 cm,ovaries appear mobile,left adnexal pain during ultrasound,no free fluid.  At her last visit, after much discussion plan was for repeat US/SHG for further evaluation.  Today she denies vaginal bleeding or irregular discharge.   Last EMB- 02/2020- benign proliferative endometrium  Patient's last menstrual period was 04/04/2019 (approximate).  [facility] 10/26/2020 09/28/2019 05/05/2019  Decreased Interest 0 0 0  Down, Depressed, Hopeless 0 0 0  PHQ - 2 Score 0 0 0  Altered sleeping 0 0 -  Tired, decreased energy 0 0 -  Change in appetite 0 0 -  Feeling bad or failure about yourself  0 0 -  Trouble concentrating 0 0 -  Moving slowly or fidgety/restless 0 0 -  Suicidal thoughts 0 0 -  PHQ-9 Score 0 0 -     Review of Systems:   Pertinent items are noted in HPI Denies fever/chills, dizziness, headaches, visual disturbances, fatigue, shortness of breath, chest pain, abdominal pain, vomiting, bowel movements, urination, or intercourse unless otherwise stated above.  Pertinent History Reviewed:  Reviewed past medical,surgical, social, obstetrical and family history.  Reviewed problem list, medications and allergies. Physical Assessment:   Vitals:   11/20/20 1009  BP: 137/86   Pulse: 71  Weight: 197 lb (89.4 kg)  Height: '5\' 7"'$  (1.702 m)  Body mass index is 30.85 kg/m.       Physical Examination:   General appearance: alert, well appearing, and in no distress  Psych: mood appropriate, normal affect  Skin: warm & dry   Cardiovascular: normal heart rate noted  Respiratory: normal respiratory effort, no distress  Abdomen: soft, non-tender   Pelvic: VULVA: normal appearing vulva with no masses, tenderness or lesions, VAGINA: normal appearing vagina with normal color and discharge, no lesions, CERVIX: normal appearing cervix without discharge or lesions  Extremities: no edema   Chaperone:  [employer]    [facility]: homogeneous anteverted uterus,wnl,homogeneous thickened endometrium,EEC 12.6 mm,saline outlined the smooth symmetrical wall of the endometrium,no solitary mass noted within the endometrial cavity,simple right ovarian cyst 3.8 x 2 x 2.6 cm,four simple left ovarian cysts (1) 3.9 x 3.8 x 2.7 cm,(2) 2.6 x 1.4 x 1.6 cm,(3) 1.5 x 1.6 x 1.4 cm,(4) 2 x 1.5 x 1.9 cm,no free fluid  Assessment & Plan:  1) Pelvic pain, Ovarian cysts -reviewed findings- cysts slightly larger and a few more present- all appear simple in nature.  Pt with only mild symptoms- plan for conservative management- repeat [place] 1 yr  2) Thickened endometrium -no discrete mass or polyp seen -pt asymptomatic -suspect thickening due to tamoxifen, prior EMB negative -should pt note bleeding RTC to discuss next step- likely D&C   Return in about 1 year (around 11/20/2021) for  if possible March 2023. Plan to have annual in March and onc visit in Sept (so more evenly spaced 6 mos apart)   Janyth Pupa, DO Attending [place], [facility] for [employer], [place]

## 2020-11-20 NOTE — Progress Notes (Signed)
PELVIC US TA/TV WITH SONOHYSTROGRAM: homogeneous anteverted uterus,wnl,homogeneous thickened endometrium,EEC 12.6 mm,saline outlined the smooth symmetrical wall of the endometrium,no solitary mass noted within the endometrial cavity,simple right ovarian cyst 3.8 x 2 x 2.6 cm,four simple left ovarian cysts (1) 3.9 x 3.8 x 2.7 cm,(2) 2.6 x 1.4 x 1.6 cm,(3) 1.5 x 1.6 x 1.4 cm,(4) 2 x 1.5 x 1.9 cm,no free fluid  Chaperone Peggy

## 2020-11-21 ENCOUNTER — Other Ambulatory Visit (HOSPITAL_COMMUNITY): Payer: Self-pay

## 2020-11-22 ENCOUNTER — Other Ambulatory Visit (HOSPITAL_COMMUNITY): Payer: Self-pay

## 2020-11-29 ENCOUNTER — Other Ambulatory Visit (HOSPITAL_COMMUNITY): Payer: 59

## 2020-12-05 NOTE — Progress Notes (Signed)
[facility] 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  The Common Hereditary Gene Panel offered by [facility] includes sequencing and/or deletion duplication testing of the following 48 genes: APC, ATM, AXIN2, BARD1, BMPR1A, BRCA1, BRCA2, BRIP1, CDH1, CDK4, CDKN2A (p14ARF), CDKN2A (p16INK4a), CHEK2, CTNNA1, DICER1, EPCAM (Deletion/duplication testing only), GREM1 (promoter region deletion/duplication testing only), KIT, MEN1, MLH1, MSH2, MSH3, MSH6, MUTYH, NBN, NF1, NHTL1, PALB2, PDGFRA, PMS2, POLD1, POLE, PTEN, RAD50, RAD51C, RAD51D, RNF43, SDHB, SDHC, SDHD, SMAD4, SMARCA4. STK11, TP53, TSC1, TSC2, and VHL.  The following genes were evaluated for sequence changes only: SDHA and HOXB13 c.251G>A variant only. The report date is October 30, 2018.     CHIEF COMPLIANT:  Follow-up for left breast high-grade DCIS   INTERVAL HISTORY: [name] is a 50 y.o. female here today for follow up of her breast high-grade DCIS. Her last visit was on 05/30/2020.   Today she reports feeling good. She reports left abdominal pain. Her last menses was December 2021, and she denies any vaginal bleeding since then. She denies hot flashes.   REVIEW OF SYSTEMS:   Review of Systems  Constitutional:  Negative for appetite change and fatigue.  Gastrointestinal:  Positive for abdominal pain (L side).  Endocrine: Negative for hot flashes.  Genitourinary:  Negative for vaginal bleeding.   Psychiatric/Behavioral:  Positive for  depression. The patient is nervous/anxious.   All other systems reviewed and are negative.  I have reviewed the past medical history, past surgical history, social history and family history with the patient and they are unchanged from previous note.   ALLERGIES:   is allergic to bee venom.   MEDICATIONS:  Current Outpatient Medications  Medication Sig Dispense Refill   ALPRAZolam (XANAX) 0.25 MG tablet Take 1 tablet (0.25 mg total) by mouth at bedtime as needed for anxiety. 30 tablet 2   atorvastatin (LIPITOR) 20 MG tablet TAKE 1 Tablet BY MOUTH ONCE DAILY (Patient taking differently: Take 10 mg by mouth at bedtime.) 90 tablet 2   CALCIUM PO Take 2,400 mg by mouth daily.     fexofenadine (ALLEGRA) 180 MG tablet Take 180 mg by mouth at bedtime.     fluticasone (FLONASE) 50 MCG/ACT nasal spray Place 1 spray into both nostrils daily. 16 g 4   hydrochlorothiazide (HYDRODIURIL) 25 MG tablet Take one tablet (25 mg dose) by mouth in the morning. (Patient taking differently: Take 25 mg by mouth daily.) 90 tablet 2   meclizine (ANTIVERT) 25 MG tablet TAKE 1 TABLET BY MOUTH EVERY 6 HOURS AS NEEDED FOR DIZZINESS. 45 tablet 0   olmesartan (BENICAR) 40 MG tablet Take 40 mg by mouth daily.     omeprazole (PRILOSEC) 40 MG capsule Take 1 capsule (40 mg total) by mouth daily. 90 capsule 1   ondansetron (ZOFRAN-ODT) 4 MG disintegrating tablet Take 8 mg by mouth every 8 (eight) hours as needed.     polyethylene glycol (MIRALAX / GLYCOLAX) 17 g packet Take 17 g by mouth daily.      Probiotic Product (PROBIOTIC PO) Take  by mouth daily.     tamoxifen (NOLVADEX) 20 MG tablet TAKE 1 TABLET (20 MG TOTAL) BY MOUTH DAILY. 30 tablet 5   No current facility-administered medications for this visit.     PHYSICAL EXAMINATION: Performance status (ECOG): 0 - Asymptomatic  There were no vitals filed for this visit. Wt Readings from Last 3 Encounters:  11/20/20 197 lb (89.4 kg)  10/26/20 199 lb 6.4 oz (90.4 kg)   08/23/20 201 lb 9.6 oz (91.4 kg)   Physical Exam Vitals reviewed.  Constitutional:      Appearance: Normal appearance.  Cardiovascular:     Rate and Rhythm: Normal rate and regular rhythm.     Pulses: Normal pulses.     Heart sounds: Normal heart sounds.  Pulmonary:     Effort: Pulmonary effort is normal.     Breath sounds: Normal breath sounds.  Chest:  Breasts:    Right: Normal.     Left: Swelling (lower medial quadrant lymphadema), skin change (UOQ lumpecty scar stable) and tenderness present.  Lymphadenopathy:     Upper Body:     Right upper body: No supraclavicular, axillary or pectoral adenopathy.     Left upper body: No supraclavicular, axillary or pectoral adenopathy.  Neurological:     General: No focal deficit present.     Mental Status: She is alert and oriented to person, place, and time.  Psychiatric:        Mood and Affect: Mood normal.        Behavior: Behavior normal.    Breast Exam Chaperone: Thana Ates     LABORATORY DATA:  I have reviewed the data as listed CMP Latest Ref Rng & Units 11/17/2020 05/02/2020 10/14/2019  Glucose 70 - 99 mg/dL 120(H) 103(H) 102(H)  BUN 6 - 20 mg/dL _0 Creatinine 0.44 - 1.00 mg/dL 0.52 0.52 0.82  Sodium 135 - 145 mmol/L 136 135 137  Potassium 3.5 - 5.1 mmol/L 3.7 3.4(L) 3.8  Chloride 98 - 111 mmol/L 104 103 106  CO2 22 - 32 mmol/L _1 Calcium 8.9 - 10.3 mg/dL 8.4(L) 8.7(L) 8.8(L)  Total Protein 6.5 - 8.1 g/dL 6.3(L) 6.4(L) 6.5  Total Bilirubin 0.3 - 1.2 mg/dL 0.2(L) 0.2(L) 0.3  Alkaline Phos 38 - 126 U/L 51 43 48  AST 15 - 41 U/L 17 16 14(L)  ALT 0 - 44 U/L _2 No results found for: TKZ601 Lab Results  Component Value Date   WBC 6.5 11/17/2020   HGB 11.8 (L) 11/17/2020   HCT 36.9 11/17/2020   MCV 83.7 11/17/2020   PLT 244 11/17/2020   NEUTROABS 4.3 11/17/2020    ASSESSMENT:  1.  Left breast high-grade DCIS: -Lumpectomy on 04/29/2018, 0.5 cm high-grade DCIS, ER 70%, PR 80%, margins negative,  0/1 lymph node positive, pTis PN 0. -She received radiation therapy. -Tamoxifen started on 05/12/2018. -Bilateral mammogram on 04/06/2019 showed left breast calcifications. -Left upper outer quadrant needle biopsy on 04/15/2018 shows benign breast tissue with calcifications. -She had mammogram of the bilateral breasts on 05/15/2020 which showed indeterminate 4 mm group of calcifications in the upper outer quadrant of the right breast. -She had biopsy on 05/23/2020 which was consistent with fibrocystic change with calcifications, usual ductal hyperplasia and sclerosing adenosis. -Endometrial biopsy on 02/14/2020 shows benign endometrial type polyp with proliferative endometrium with no hyperplasia or malignancy.  She denies any spotting at this time.  2.  Mild hypoproteinemia: -She has mild hypoalbuminemia dating back  several years.  She also has mild leg swellings. -She was instructed to eat high-protein diet.   3.  Family history: -Mother had breast and lung cancer.  Maternal aunt had breast cancer.  Maternal uncle had colon cancer.  Sister had anal cancer. -Genetic testing was negative.   PLAN:  1.  Left breast high-grade DCIS: - She is tolerating tamoxifen reasonably well. - Physical examination today did not reveal any palpable masses. - Labs today shows normal LFTs and CBC. - She had ultrasound of the pelvis which showed endometrium 12.6 mm, symmetrical, homogeneous.  However thickness has increased from the prior scan.  Biopsy at that time was negative. - She is not having any spotting.  She will follow closely with her GYN with repeat ultrasound and biopsies if needed. - We will schedule her for mammogram in March.  RTC 6 months for follow-up.   2.  Bone health: - Vitamin D is 48.  Continue calcium and vitamin D supplements.   3.  Anxiety/sleeping difficulty: - Continue Xanax 0.25 mg at bedtime as needed.  I have given refill.  Breast Cancer therapy associated bone loss: I have  recommended calcium, Vitamin D and weight bearing exercises.  Orders placed this encounter:  No orders of the defined types were placed in this encounter.   The patient has a good understanding of the overall plan. She agrees with it. She will call with any problems that may develop before the next visit here.  Derek Jack, MD [place] [phone]   I, Thana Ates, am acting as a scribe for Dr. Derek Jack.  I, Derek Jack MD, have reviewed the above documentation for accuracy and completeness, and I agree with the above.

## 2020-12-06 ENCOUNTER — Other Ambulatory Visit (HOSPITAL_COMMUNITY): Payer: Self-pay | Admitting: Hematology

## 2020-12-06 ENCOUNTER — Inpatient Hospital Stay (HOSPITAL_COMMUNITY): Payer: 59 | Attending: Hematology | Admitting: Hematology

## 2020-12-06 ENCOUNTER — Other Ambulatory Visit (HOSPITAL_COMMUNITY): Payer: Self-pay

## 2020-12-06 ENCOUNTER — Other Ambulatory Visit: Payer: Self-pay

## 2020-12-06 DIAGNOSIS — Z79810 Long term (current) use of selective estrogen receptor modulators (SERMs): Secondary | ICD-10-CM | POA: Insufficient documentation

## 2020-12-06 DIAGNOSIS — Z79899 Other long term (current) drug therapy: Secondary | ICD-10-CM | POA: Diagnosis not present

## 2020-12-06 DIAGNOSIS — D0512 Intraductal carcinoma in situ of left breast: Secondary | ICD-10-CM | POA: Diagnosis not present

## 2020-12-06 MED ORDER — ALPRAZOLAM 0.25 MG PO TABS
0.2500 mg | ORAL_TABLET | Freq: Every evening | ORAL | 2 refills | Status: DC | PRN
  Filled 2020-12-06: qty 30, 30d supply, fill #0

## 2020-12-06 NOTE — Patient Instructions (Addendum)
[place] at [facility] Discharge Instructions  You were seen today by Dr. Delton Coombes. He went over your recent results. You will be scheduled for a mammogram after 05/15/2020. Dr. Delton Coombes will see you back in 6 months for labs and follow up.   Thank you for choosing [place] [facility] to provide your oncology and hematology care.  To afford each patient quality time with our provider, please arrive at least 15 minutes before your scheduled appointment time.   If you have a lab appointment with the [facility] please come in thru the Main Entrance and check in at the main information desk  You need to re-schedule your appointment should you arrive 10 or more minutes late.  We strive to give you quality time with our providers, and arriving late affects you and other patients whose appointments are after yours.  Also, if you no show three or more times for appointments you may be dismissed from the clinic at the providers discretion.     Again, thank you for choosing [facility].  Our hope is that these requests will decrease the amount of time that you wait before being seen by our physicians.       _____________________________________________________________  Should you have questions after your visit to [facility], please contact our office at [phone] between the hours of 8:00 a.m. and 4:30 p.m.  Voicemails left after 4:00 p.m. will not be returned until the following business day.  For prescription refill requests, have your pharmacy contact our office and allow 72 hours.    Cancer Center Support Programs:   > Cancer Support Group  2nd Tuesday of the month 1pm-2pm, Journey Room

## 2020-12-07 ENCOUNTER — Other Ambulatory Visit (HOSPITAL_COMMUNITY): Payer: Self-pay

## 2020-12-08 DIAGNOSIS — D0512 Intraductal carcinoma in situ of left breast: Secondary | ICD-10-CM | POA: Diagnosis not present

## 2020-12-21 ENCOUNTER — Other Ambulatory Visit (HOSPITAL_COMMUNITY): Payer: Self-pay

## 2020-12-21 ENCOUNTER — Other Ambulatory Visit (HOSPITAL_COMMUNITY): Payer: Self-pay | Admitting: Hematology

## 2020-12-21 DIAGNOSIS — D0512 Intraductal carcinoma in situ of left breast: Secondary | ICD-10-CM

## 2020-12-21 MED ORDER — OMEPRAZOLE 40 MG PO CPDR
40.0000 mg | DELAYED_RELEASE_CAPSULE | Freq: Every day | ORAL | 1 refills | Status: DC
  Filled 2020-12-21: qty 90, 90d supply, fill #0
  Filled 2021-04-04: qty 90, 90d supply, fill #1

## 2020-12-21 MED ORDER — TAMOXIFEN CITRATE 20 MG PO TABS
20.0000 mg | ORAL_TABLET | Freq: Every day | ORAL | 11 refills | Status: DC
  Filled 2020-12-21: qty 30, 30d supply, fill #0
  Filled 2021-01-24: qty 30, 30d supply, fill #1
  Filled 2021-02-20: qty 30, 30d supply, fill #2
  Filled 2021-03-19: qty 30, 30d supply, fill #3
  Filled 2021-04-23: qty 30, 30d supply, fill #4
  Filled 2021-05-20: qty 30, 30d supply, fill #5
  Filled 2021-06-18: qty 30, 30d supply, fill #6
  Filled 2021-07-19: qty 30, 30d supply, fill #7
  Filled 2021-08-20: qty 30, 30d supply, fill #8
  Filled 2021-09-17: qty 30, 30d supply, fill #9
  Filled 2021-10-21: qty 30, 30d supply, fill #10
  Filled 2021-11-19: qty 30, 30d supply, fill #11

## 2020-12-21 MED ORDER — OLMESARTAN MEDOXOMIL 40 MG PO TABS
40.0000 mg | ORAL_TABLET | Freq: Every day | ORAL | 1 refills | Status: DC
  Filled 2020-12-21: qty 90, 90d supply, fill #0
  Filled 2021-04-04: qty 90, 90d supply, fill #1

## 2020-12-21 NOTE — Telephone Encounter (Signed)
Chart reviewed, patient to continue tamoxifen, refills sent.

## 2020-12-22 ENCOUNTER — Other Ambulatory Visit (HOSPITAL_COMMUNITY): Payer: Self-pay

## 2020-12-28 ENCOUNTER — Other Ambulatory Visit (HOSPITAL_COMMUNITY): Payer: Self-pay

## 2021-01-01 ENCOUNTER — Other Ambulatory Visit (HOSPITAL_COMMUNITY): Payer: Self-pay

## 2021-01-01 MED ORDER — CARESTART COVID-19 HOME TEST VI KIT
PACK | 0 refills | Status: DC
  Filled 2021-01-01: qty 4, 4d supply, fill #0

## 2021-01-02 ENCOUNTER — Other Ambulatory Visit (HOSPITAL_COMMUNITY): Payer: Self-pay

## 2021-01-03 ENCOUNTER — Other Ambulatory Visit (HOSPITAL_COMMUNITY): Payer: Self-pay

## 2021-01-04 ENCOUNTER — Other Ambulatory Visit (HOSPITAL_COMMUNITY): Payer: Self-pay

## 2021-01-04 MED ORDER — ATORVASTATIN CALCIUM 10 MG PO TABS
10.0000 mg | ORAL_TABLET | Freq: Every day | ORAL | 3 refills | Status: DC
  Filled 2021-01-04: qty 30, 30d supply, fill #0
  Filled 2021-01-31: qty 30, 30d supply, fill #1
  Filled 2021-02-25: qty 30, 30d supply, fill #2
  Filled 2021-04-04: qty 30, 30d supply, fill #3

## 2021-01-05 ENCOUNTER — Other Ambulatory Visit (HOSPITAL_COMMUNITY): Payer: Self-pay

## 2021-01-24 ENCOUNTER — Other Ambulatory Visit (HOSPITAL_COMMUNITY): Payer: Self-pay

## 2021-01-31 ENCOUNTER — Other Ambulatory Visit (HOSPITAL_COMMUNITY): Payer: Self-pay

## 2021-02-05 ENCOUNTER — Other Ambulatory Visit (HOSPITAL_COMMUNITY): Payer: Self-pay

## 2021-02-20 ENCOUNTER — Other Ambulatory Visit (HOSPITAL_COMMUNITY): Payer: Self-pay

## 2021-02-22 ENCOUNTER — Ambulatory Visit: Payer: 59 | Admitting: Gastroenterology

## 2021-02-26 ENCOUNTER — Other Ambulatory Visit (HOSPITAL_COMMUNITY): Payer: Self-pay

## 2021-03-19 ENCOUNTER — Other Ambulatory Visit (HOSPITAL_COMMUNITY): Payer: Self-pay

## 2021-03-29 IMAGING — MG DIGITAL DIAGNOSTIC BILAT W/ TOMO W/ CAD
8 of 11 series · 8 of 27 positions shown · non-contrast
Comparison: Previous exam(s).

CLINICAL DATA: 48-year-old female with left lumpectomy 04/29/2018
followed by radiation therapy for high-grade DCIS in the left
breast. The coil shaped biopsy marking clip following the
stereotactic guided biopsy of the calcifications in the left breast
was displaced greater than 4 cm medial and therefore was not
included in the surgical specimen.

EXAM:
DIGITAL DIAGNOSTIC BILATERAL MAMMOGRAM WITH CAD AND TOMO

[L CC]
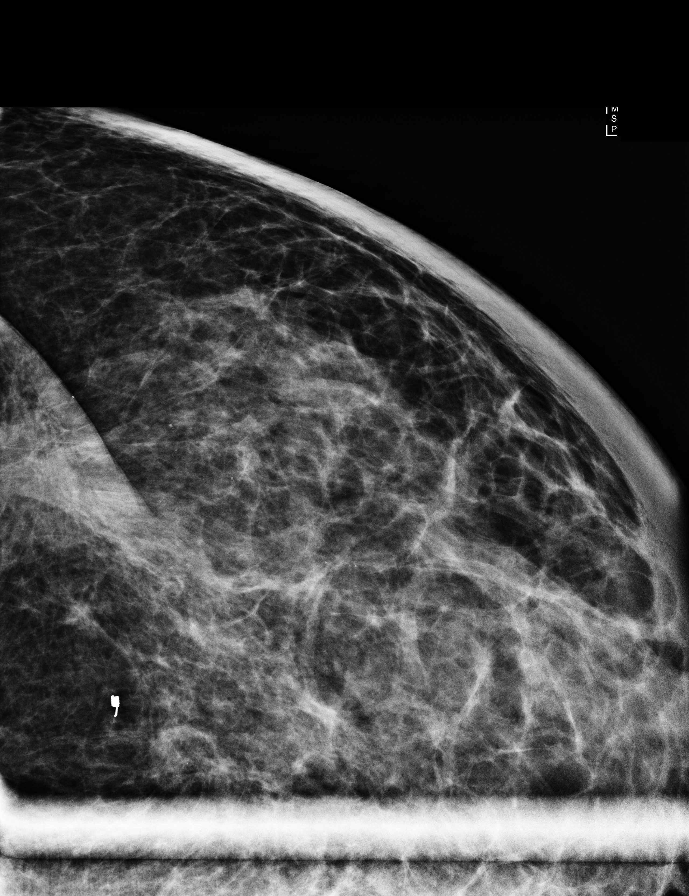

[L MLO]
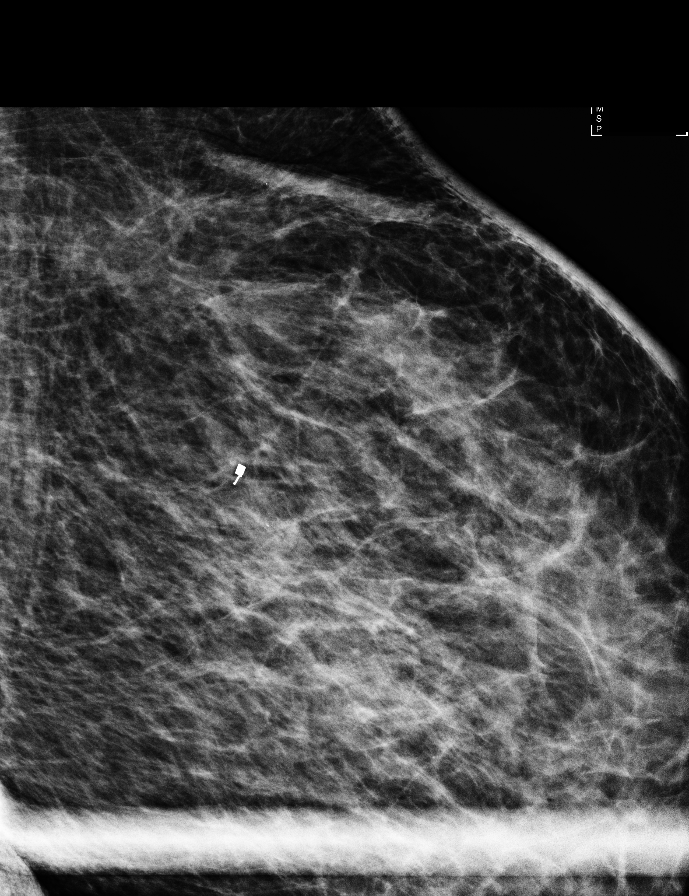

[L ML]
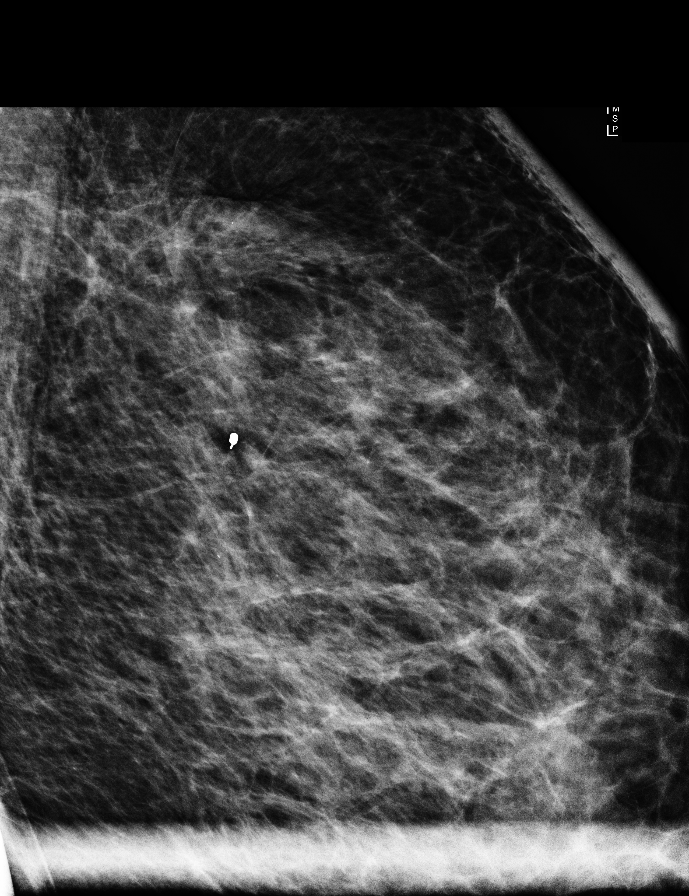

[R MLO synth-2D]
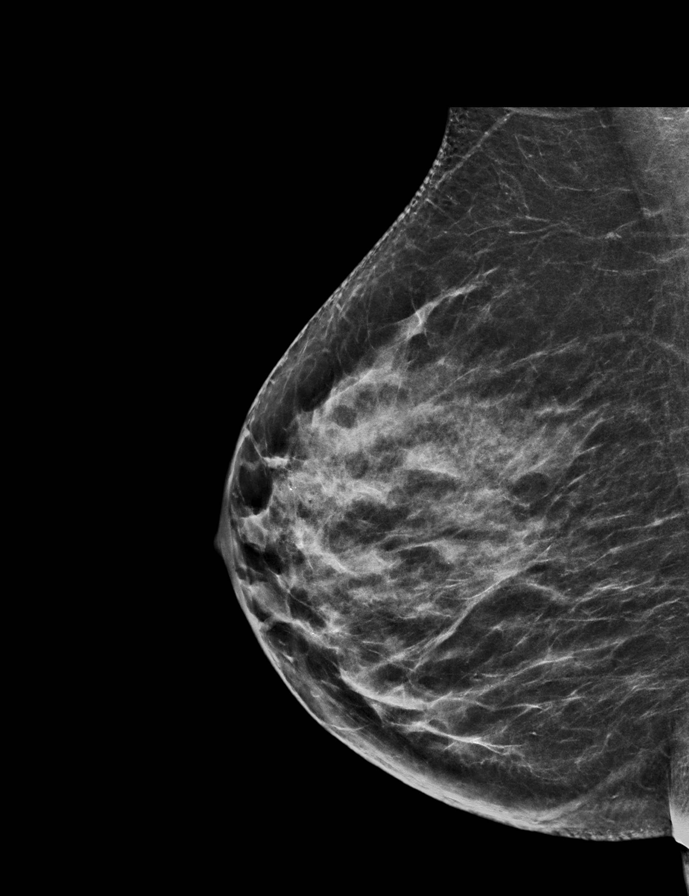

[R CC synth-2D]
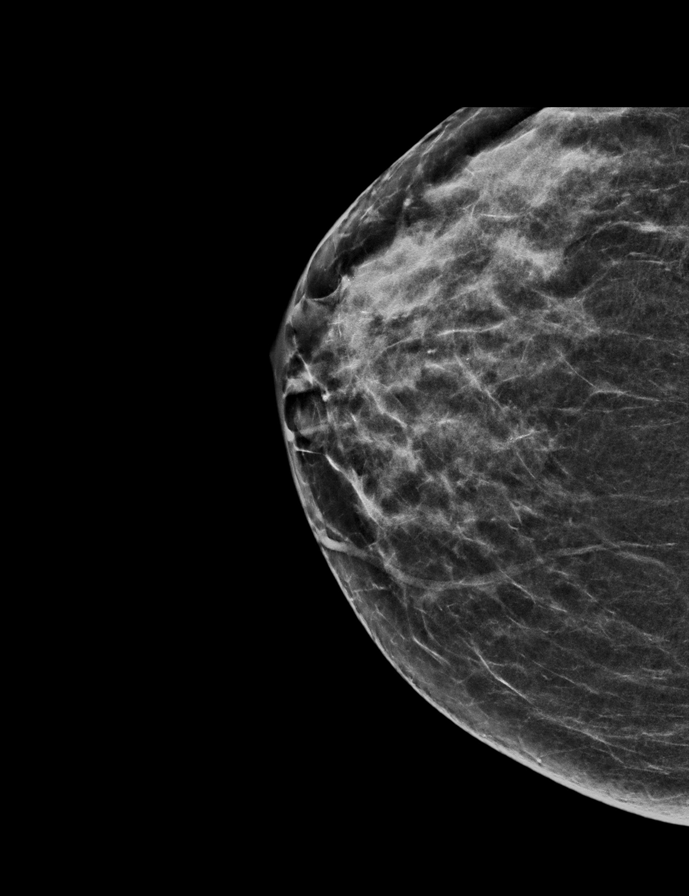

[L MLO synth-2D]
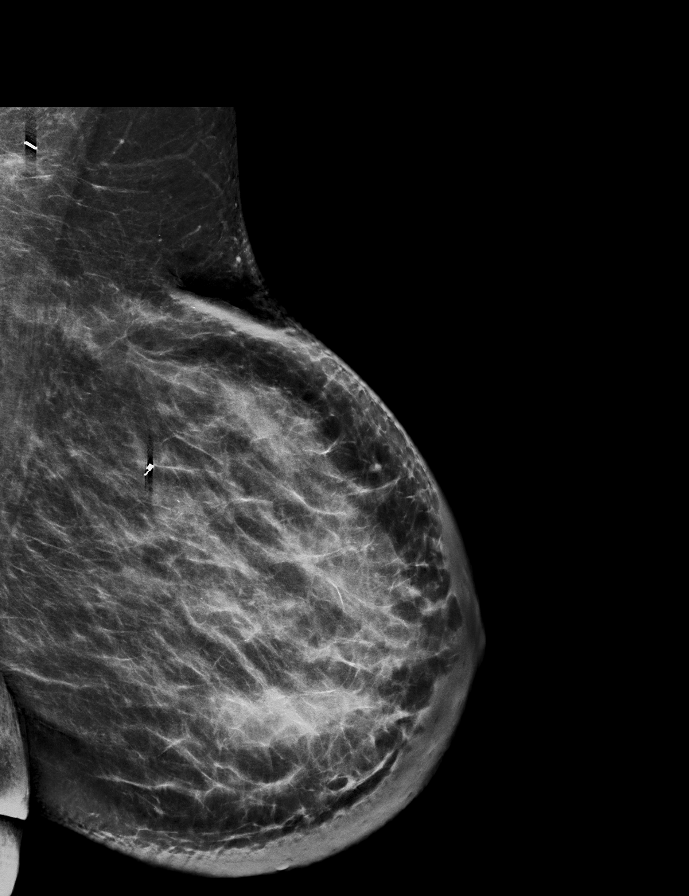

[L CC synth-2D]
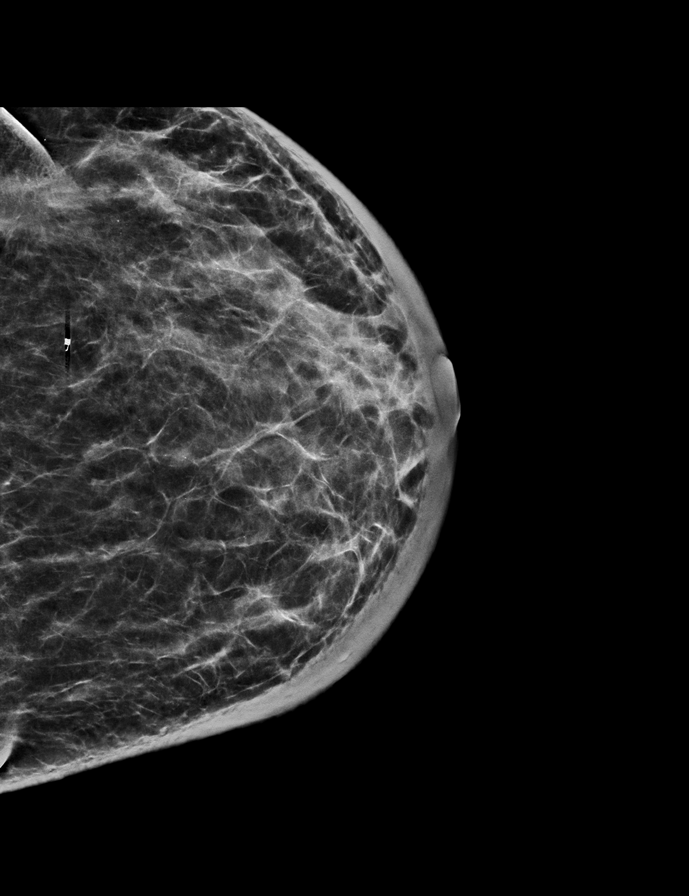

[R MLO tomo · tomo slice 33/64.0]
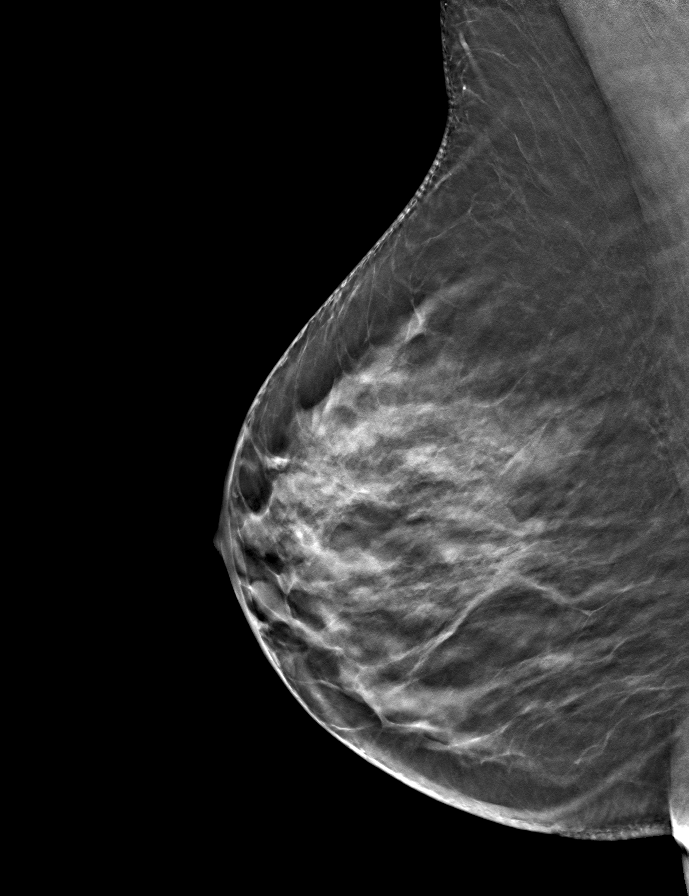

[8 of 27 positions shown; findings below may reference images not displayed]

ACR Breast Density Category c: The breast tissue is heterogeneously
dense, which may obscure small masses.
FINDINGS: No suspicious masses or calcifications are seen in the right breast.
New lumpectomy changes are present within the upper-outer posterior
left breast as well as new skin and trabecular thickening compatible
with radiation therapy. There are mixed punctate and amorphous
calcifications seen anterior and inferior to the lumpectomy site
within the upper-outer left breast which appear somewhat linear
oriented on the spot compression magnification CC view,
indeterminate in appearance. The span approximately 3.5 cm.

Mammographic images were processed with CAD.
IMPRESSION: Indeterminate calcifications located anterior inferior to the
lumpectomy site in the upper-outer left breast.

RECOMMENDATION:
Stereotactic guided biopsy of the calcifications in the left breast
is recommended.

I have discussed the findings and recommendations with the patient.
If applicable, a reminder letter will be sent to the patient
regarding the next appointment.

BI-RADS CATEGORY  4: Suspicious.

## 2021-04-04 ENCOUNTER — Other Ambulatory Visit (HOSPITAL_COMMUNITY): Payer: Self-pay

## 2021-04-04 DIAGNOSIS — M25512 Pain in left shoulder: Secondary | ICD-10-CM | POA: Diagnosis not present

## 2021-04-04 DIAGNOSIS — B029 Zoster without complications: Secondary | ICD-10-CM | POA: Diagnosis not present

## 2021-04-06 ENCOUNTER — Other Ambulatory Visit (HOSPITAL_COMMUNITY): Payer: Self-pay

## 2021-04-11 DIAGNOSIS — B029 Zoster without complications: Secondary | ICD-10-CM | POA: Diagnosis not present

## 2021-04-11 DIAGNOSIS — E782 Mixed hyperlipidemia: Secondary | ICD-10-CM | POA: Diagnosis not present

## 2021-04-11 DIAGNOSIS — E6609 Other obesity due to excess calories: Secondary | ICD-10-CM | POA: Diagnosis not present

## 2021-04-11 DIAGNOSIS — I10 Essential (primary) hypertension: Secondary | ICD-10-CM | POA: Diagnosis not present

## 2021-04-11 DIAGNOSIS — R7303 Prediabetes: Secondary | ICD-10-CM | POA: Diagnosis not present

## 2021-04-11 DIAGNOSIS — Z6830 Body mass index (BMI) 30.0-30.9, adult: Secondary | ICD-10-CM | POA: Diagnosis not present

## 2021-04-11 DIAGNOSIS — M7989 Other specified soft tissue disorders: Secondary | ICD-10-CM | POA: Diagnosis not present

## 2021-04-12 ENCOUNTER — Other Ambulatory Visit (HOSPITAL_COMMUNITY): Payer: Self-pay

## 2021-04-12 MED ORDER — VALACYCLOVIR HCL 1 G PO TABS
1000.0000 mg | ORAL_TABLET | Freq: Three times a day (TID) | ORAL | 2 refills | Status: DC
  Filled 2021-04-12: qty 30, 10d supply, fill #0

## 2021-04-13 ENCOUNTER — Other Ambulatory Visit (HOSPITAL_COMMUNITY): Payer: Self-pay

## 2021-04-20 ENCOUNTER — Other Ambulatory Visit (HOSPITAL_COMMUNITY): Payer: Self-pay

## 2021-04-20 MED ORDER — GABAPENTIN 100 MG PO CAPS
100.0000 mg | ORAL_CAPSULE | Freq: Three times a day (TID) | ORAL | 2 refills | Status: DC | PRN
  Filled 2021-04-20: qty 90, 30d supply, fill #0

## 2021-04-23 ENCOUNTER — Other Ambulatory Visit (HOSPITAL_COMMUNITY): Payer: Self-pay

## 2021-04-23 MED ORDER — ATORVASTATIN CALCIUM 10 MG PO TABS
10.0000 mg | ORAL_TABLET | Freq: Every day | ORAL | 3 refills | Status: DC
  Filled 2021-04-23: qty 30, 30d supply, fill #0

## 2021-05-01 ENCOUNTER — Other Ambulatory Visit (HOSPITAL_COMMUNITY): Payer: Self-pay

## 2021-05-01 MED ORDER — ATORVASTATIN CALCIUM 10 MG PO TABS
10.0000 mg | ORAL_TABLET | Freq: Every day | ORAL | 0 refills | Status: DC
  Filled 2021-05-01: qty 90, 90d supply, fill #0

## 2021-05-02 ENCOUNTER — Other Ambulatory Visit (HOSPITAL_COMMUNITY): Payer: Self-pay

## 2021-05-16 ENCOUNTER — Ambulatory Visit
Admission: RE | Admit: 2021-05-16 | Discharge: 2021-05-16 | Disposition: A | Discharge status: Home / Self Care | Payer: 59 | Source: Ambulatory Visit | Attending: Hematology | Admitting: Hematology

## 2021-05-16 ENCOUNTER — Other Ambulatory Visit (HOSPITAL_COMMUNITY): Payer: Self-pay | Admitting: Hematology

## 2021-05-16 DIAGNOSIS — R922 Inconclusive mammogram: Secondary | ICD-10-CM | POA: Diagnosis not present

## 2021-05-16 DIAGNOSIS — D0512 Intraductal carcinoma in situ of left breast: Secondary | ICD-10-CM

## 2021-05-16 DIAGNOSIS — Z853 Personal history of malignant neoplasm of breast: Secondary | ICD-10-CM | POA: Diagnosis not present

## 2021-05-16 DIAGNOSIS — N6001 Solitary cyst of right breast: Secondary | ICD-10-CM | POA: Diagnosis not present

## 2021-05-16 DIAGNOSIS — N6310 Unspecified lump in the right breast, unspecified quadrant: Secondary | ICD-10-CM

## 2021-05-21 ENCOUNTER — Other Ambulatory Visit (HOSPITAL_COMMUNITY): Payer: Self-pay

## 2021-05-22 ENCOUNTER — Other Ambulatory Visit (HOSPITAL_COMMUNITY): Payer: 59

## 2021-05-22 ENCOUNTER — Inpatient Hospital Stay (HOSPITAL_COMMUNITY): Payer: 59 | Attending: Hematology

## 2021-05-22 ENCOUNTER — Encounter (HOSPITAL_COMMUNITY): Payer: 59

## 2021-05-22 DIAGNOSIS — E559 Vitamin D deficiency, unspecified: Secondary | ICD-10-CM | POA: Diagnosis not present

## 2021-05-22 DIAGNOSIS — Z79899 Other long term (current) drug therapy: Secondary | ICD-10-CM | POA: Insufficient documentation

## 2021-05-22 DIAGNOSIS — F419 Anxiety disorder, unspecified: Secondary | ICD-10-CM | POA: Diagnosis not present

## 2021-05-22 DIAGNOSIS — E8809 Other disorders of plasma-protein metabolism, not elsewhere classified: Secondary | ICD-10-CM | POA: Diagnosis not present

## 2021-05-22 DIAGNOSIS — D0512 Intraductal carcinoma in situ of left breast: Secondary | ICD-10-CM | POA: Insufficient documentation

## 2021-05-22 DIAGNOSIS — Z79810 Long term (current) use of selective estrogen receptor modulators (SERMs): Secondary | ICD-10-CM | POA: Diagnosis not present

## 2021-05-22 LAB — CBC WITH DIFFERENTIAL/PLATELET
Abs Immature Granulocytes: 0.02 10*3/uL (ref 0.00–0.07)
Basophils Absolute: 0 10*3/uL (ref 0.0–0.1)
Basophils Relative: 0 %
Eosinophils Absolute: 0.1 10*3/uL (ref 0.0–0.5)
Eosinophils Relative: 1 %
HCT: 37.6 % (ref 36.0–46.0)
Hemoglobin: 11.8 g/dL — ABNORMAL LOW (ref 12.0–15.0)
Immature Granulocytes: 0 %
Lymphocytes Relative: 33 %
Lymphs Abs: 1.9 10*3/uL (ref 0.7–4.0)
MCH: 26.8 pg (ref 26.0–34.0)
MCHC: 31.4 g/dL (ref 30.0–36.0)
MCV: 85.3 fL (ref 80.0–100.0)
Monocytes Absolute: 0.5 10*3/uL (ref 0.1–1.0)
Monocytes Relative: 8 %
Neutro Abs: 3.3 10*3/uL (ref 1.7–7.7)
Neutrophils Relative %: 58 %
Platelets: 241 10*3/uL (ref 150–400)
RBC: 4.41 MIL/uL (ref 3.87–5.11)
RDW: 15.9 % — ABNORMAL HIGH (ref 11.5–15.5)
WBC: 5.8 10*3/uL (ref 4.0–10.5)
nRBC: 0 % (ref 0.0–0.2)

## 2021-05-22 LAB — COMPREHENSIVE METABOLIC PANEL
ALT: 22 U/L (ref 0–44)
AST: 21 U/L (ref 15–41)
Albumin: 3.6 g/dL (ref 3.5–5.0)
Alkaline Phosphatase: 52 U/L (ref 38–126)
Anion gap: 10 (ref 5–15)
BUN: 13 mg/dL (ref 6–20)
CO2: 24 mmol/L (ref 22–32)
Calcium: 8.7 mg/dL — ABNORMAL LOW (ref 8.9–10.3)
Chloride: 104 mmol/L (ref 98–111)
Creatinine, Ser: 0.48 mg/dL (ref 0.44–1.00)
GFR, Estimated: >60 mL/min (ref >60)
Glucose, Bld: 106 mg/dL — ABNORMAL HIGH (ref 70–99)
Potassium: 3.3 mmol/L — ABNORMAL LOW (ref 3.5–5.1)
Sodium: 138 mmol/L (ref 135–145)
Total Bilirubin: 0.3 mg/dL (ref 0.3–1.2)
Total Protein: 6.4 g/dL — ABNORMAL LOW (ref 6.5–8.1)

## 2021-05-22 LAB — VITAMIN D 25 HYDROXY (VIT D DEFICIENCY, FRACTURES): Vit D, 25-Hydroxy: 36.66 ng/mL (ref 30–100)

## 2021-05-29 ENCOUNTER — Inpatient Hospital Stay (HOSPITAL_BASED_OUTPATIENT_CLINIC_OR_DEPARTMENT_OTHER): Payer: 59 | Admitting: Hematology

## 2021-05-29 ENCOUNTER — Other Ambulatory Visit: Payer: Self-pay

## 2021-05-29 ENCOUNTER — Ambulatory Visit (HOSPITAL_COMMUNITY): Payer: 59 | Admitting: Hematology

## 2021-05-29 ENCOUNTER — Other Ambulatory Visit (HOSPITAL_COMMUNITY): Payer: Self-pay | Admitting: *Deleted

## 2021-05-29 VITALS — BP 113/73 | HR 71 | Temp 96.9°F | Resp 18 | Ht 67.0 in | Wt 195.3 lb

## 2021-05-29 DIAGNOSIS — Z79899 Other long term (current) drug therapy: Secondary | ICD-10-CM | POA: Diagnosis not present

## 2021-05-29 DIAGNOSIS — D0512 Intraductal carcinoma in situ of left breast: Secondary | ICD-10-CM

## 2021-05-29 DIAGNOSIS — E559 Vitamin D deficiency, unspecified: Secondary | ICD-10-CM | POA: Diagnosis not present

## 2021-05-29 DIAGNOSIS — Z79810 Long term (current) use of selective estrogen receptor modulators (SERMs): Secondary | ICD-10-CM | POA: Diagnosis not present

## 2021-05-29 DIAGNOSIS — F419 Anxiety disorder, unspecified: Secondary | ICD-10-CM | POA: Diagnosis not present

## 2021-05-29 DIAGNOSIS — E8809 Other disorders of plasma-protein metabolism, not elsewhere classified: Secondary | ICD-10-CM | POA: Diagnosis not present

## 2021-05-29 NOTE — Progress Notes (Signed)
? ?[facility] [place] [place] ?[address] ?[place], [place] [address] ? ? ?Patient Care Team: ?Pablo Lawrence, NP as PCP - General (Adult Health Nurse Practitioner) ?Fields, Marga Melnick, MD (Inactive) as Consulting Physician (Gastroenterology) ? 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  ?10/30/2018 Genetic Testing  ? Negative genetic testing on the common hereditary cancer panel.  The Common Hereditary Gene Panel offered by [facility] includes sequencing and/or deletion duplication testing of the following 48 genes: APC, ATM, AXIN2, BARD1, BMPR1A, BRCA1, BRCA2, BRIP1, CDH1, CDK4, CDKN2A (p14ARF), CDKN2A (p16INK4a), CHEK2, CTNNA1, DICER1, EPCAM (Deletion/duplication testing only), GREM1 (promoter region deletion/duplication testing only), KIT, MEN1, MLH1, MSH2, MSH3, MSH6, MUTYH, NBN, NF1, NHTL1, PALB2, PDGFRA, PMS2, POLD1, POLE, PTEN, RAD50, RAD51C, RAD51D, RNF43, SDHB, SDHC, SDHD, SMAD4, SMARCA4. STK11, TP53, TSC1, TSC2, and VHL.  The following genes were evaluated for sequence changes only: SDHA and HOXB13 c.251G>A variant only. The report date is October 30, 2018. ?  ? ? ?CHIEF COMPLIANT: Follow-up for left breast high-grade DCIS ? ? ?INTERVAL HISTORY: [name] is a 51 y.o. female here today for follow up of her left breast high-grade DCIS. Her last visit was on 12/06/2020.  ? ?Today she reports feeling good. She denies vaginal spotting and bleeding. She continues to take calcium and vitamin D. She is taking tamoxifen and tolerating it well. She reports a shingles episode on her back and her left arm for which she took Valtrex.  ? ?REVIEW OF SYSTEMS:   ?Review of Systems  ?Constitutional:  Negative for appetite change and fatigue.  ?Genitourinary:  Negative for vaginal bleeding.   ?Neurological:  Positive for dizziness and numbness.   ?Psychiatric/Behavioral:  Positive for sleep disturbance.   ?All other systems reviewed and are negative. ? ?I have reviewed the past medical history, past surgical history, social history and family history with the patient and they are unchanged from previous note. ? ? ?ALLERGIES:   ?is allergic to bee venom. ? ? ?MEDICATIONS:  ?Current Outpatient Medications  ?Medication Sig Dispense Refill  ? ALPRAZolam (XANAX) 0.25 MG tablet Take 1 tablet (0.25 mg total) by mouth at bedtime as needed for anxiety. 30 tablet 2  ? atorvastatin (LIPITOR) 10 MG tablet Take 1 tablet (10 mg total) by mouth daily. 90 tablet 0  ? CALCIUM PO Take 2,400 mg by mouth daily.    ? COVID-19 At Home Antigen Test [facility] COVID-19 HOME TEST) KIT Use as directed 4 each 0  ? fexofenadine (ALLEGRA) 180 MG tablet Take 180 mg by mouth at bedtime.    ? fluticasone (FLONASE) 50 MCG/ACT nasal spray Use 2 sprays in each nostril daily 16 g 5  ? hydrochlorothiazide (HYDRODIURIL) 25 MG tablet Take one tablet (25 mg dose) by mouth in the morning. (Patient taking differently: Take 25 mg by mouth daily.) 90 tablet 2  ? olmesartan (BENICAR) 40 MG tablet Take 1 tablet (40 mg total) by mouth daily as directed 90 tablet 1  ? omeprazole (PRILOSEC) 40 MG capsule Take 1 capsule (40 mg total) by mouth daily. 90 capsule 1  ? polyethylene glycol (MIRALAX / GLYCOLAX) 17 g packet Take 17 g by mouth daily.     ? Probiotic Product (PROBIOTIC PO) Take by mouth daily.    ? tamoxifen (NOLVADEX) 20 MG tablet Take 1 tablet (20 mg total) by mouth daily.  30 tablet 11  ? ?No current facility-administered medications for this visit.  ? ? ? ?PHYSICAL EXAMINATION: ?Performance status (ECOG): 0 - Asymptomatic ? ?Vitals:  ? 05/29/21 0809  ?BP: 113/73  ?Pulse: 71  ?Resp: 18  ?Temp: (!) 96.9 ?F (36.1 ?C)  ?SpO2: 100%  ? ?Wt Readings from Last 3 Encounters:  ?05/29/21 195 lb 5.2 oz (88.6 kg)  ?11/20/20 197 lb (89.4 kg)  ?10/26/20 199 lb 6.4 oz (90.4 kg)  ? ?Physical Exam ?Vitals  reviewed.  ?Constitutional:   ?   Appearance: Normal appearance.  ?Cardiovascular:  ?   Rate and Rhythm: Normal rate and regular rhythm.  ?   Pulses: Normal pulses.  ?   Heart sounds: Normal heart sounds.  ?Pulmonary:  ?   Effort: Pulmonary effort is normal.  ?   Breath sounds: Normal breath sounds.  ?Neurological:  ?   General: No focal deficit present.  ?   Mental Status: She is alert and oriented to person, place, and time.  ?Psychiatric:     ?   Mood and Affect: Mood normal.     ?   Behavior: Behavior normal.  ? ? ?Breast Exam Chaperone: Thana Ates   ? ? ?LABORATORY DATA:  ?I have reviewed the data as listed ?CMP Latest Ref Rng & Units 05/22/2021 11/17/2020 05/02/2020  ?Glucose 70 - 99 mg/dL 106(H) 120(H) 103(H)  ?BUN 6 - 20 mg/dL _0 ?Creatinine 0.44 - 1.00 mg/dL 0.48 0.52 0.52  ?Sodium 135 - 145 mmol/L 138 136 135  ?Potassium 3.5 - 5.1 mmol/L 3.3(L) 3.7 3.4(L)  ?Chloride 98 - 111 mmol/L 104 104 103  ?CO2 22 - 32 mmol/L _1 ?Calcium 8.9 - 10.3 mg/dL 8.7(L) 8.4(L) 8.7(L)  ?Total Protein 6.5 - 8.1 g/dL 6.4(L) 6.3(L) 6.4(L)  ?Total Bilirubin 0.3 - 1.2 mg/dL 0.3 0.2(L) 0.2(L)  ?Alkaline Phos 38 - 126 U/L 52 51 43  ?AST 15 - 41 U/L _2 ?ALT 0 - 44 U/L _3 ? ?No results found for: [id number] ?Lab Results  ?Component Value Date  ? WBC 5.8 05/22/2021  ? HGB 11.8 (L) 05/22/2021  ? HCT 37.6 05/22/2021  ? MCV 85.3 05/22/2021  ? PLT 241 05/22/2021  ? NEUTROABS 3.3 05/22/2021  ? ? ?ASSESSMENT:  ?1.  Left breast high-grade DCIS: ?-Lumpectomy on 04/29/2018, 0.5 cm high-grade DCIS, ER 70%, PR 80%, margins negative, 0/1 lymph node positive, pTis PN 0. ?-She received radiation therapy. ?-Tamoxifen started on 05/12/2018. ?-Bilateral mammogram on 04/06/2019 showed left breast calcifications. ?-Left upper outer quadrant needle biopsy on 04/15/2018 shows benign breast tissue with calcifications. ?-She had mammogram of the bilateral breasts on 05/15/2020 which showed indeterminate 4 mm group of calcifications in the upper  outer quadrant of the right breast. ?-She had biopsy on 05/23/2020 which was consistent with fibrocystic change with calcifications, usual ductal hyperplasia and sclerosing adenosis. ?-Endometrial biopsy on 02/14/2020 shows benign endometrial type polyp with proliferative endometrium with no hyperplasia or malignancy.  She denies any spotting at this time. ?  ?2.  Mild hypoproteinemia: ?-She has mild hypoalbuminemia dating back several years.  She also has mild leg swellings. ?-She was instructed to eat high-protein diet. ?  ?3.  Family history: ?-Mother had breast and lung cancer.  Maternal aunt had breast cancer.  Maternal uncle had colon cancer.  Sister had anal cancer. ?-Genetic testing was negative. ? ? ?PLAN:  ?1.  Left breast high-grade DCIS: ?- She is tolerating tamoxifen  reasonably well.-She reportedly had shingles of the left shoulder and used Valtrex. ?- She denies any spotting or bleeding.  She has GYN follow-up in June for Pap smear and ultrasound. ?- Reviewed labs which showed normal LFTs and CBC. ?- Mammogram from 05/16/2021 was BI-RADS 0 with new benign cyst in the upper inner right breast.  Breast ultrasound showed new benign cyst in the upper inner right breast.  No evidence of breast malignancy. ?- According to the radiologist, she may benefit from doing ultrasound and mammogram yearly. ?- RTC 6 months for follow-up with repeat labs. ?  ?2.  Bone health: ?- Vitamin D level is 36.  Continue calcium and vitamin D supplements. ?  ?3.  Anxiety/sleeping difficulty: ?- Continue Xanax at bedtime as needed. ? ?Breast Cancer therapy associated bone loss: I have recommended calcium, Vitamin D and weight bearing exercises. ? ?Orders placed this encounter:  ?No orders of the defined types were placed in this encounter. ? ? ?The patient has a good understanding of the overall plan. She agrees with it. She will call with any problems that may develop before the next visit here. ? ?Derek Jack, MD ?[place] ?[phone] ? ? ?I, Thana Ates, am acting as a scribe for Dr. Derek Jack. ? ?I, Derek Jack MD, have reviewed the above documentation for accuracy and completeness, and I agr

## 2021-05-29 NOTE — Patient Instructions (Signed)
[place] at [facility] ?Discharge Instructions ? ? ?You were seen and examined today by Dr. Delton Coombes. ? ?He reviewed the results of your lab work which are normal/stable. ? ?Return as scheduled in 6 months.  ? ? ?Thank you for choosing [place] at [facility] to provide your oncology and hematology care.  To afford each patient quality time with our provider, please arrive at least 15 minutes before your scheduled appointment time.  ? ?If you have a lab appointment with the [facility] please come in thru the Main Entrance and check in at the main information desk. ? ?You need to re-schedule your appointment should you arrive 10 or more minutes late.  We strive to give you quality time with our providers, and arriving late affects you and other patients whose appointments are after yours.  Also, if you no show three or more times for appointments you may be dismissed from the clinic at the providers discretion.     ?Again, thank you for choosing [facility]  Our hope is that these requests will decrease the amount of time that you wait before being seen by our physicians.       ?_____________________________________________________________ ? ?Should you have questions after your visit to [facility], please contact our office at [phone] and follow the prompts.  Our office hours are 8:00 a.m. and 4:30 p.m. Monday - Friday.  Please note that voicemails left after 4:00 p.m. may not be returned until the following business day.  We are closed weekends and major holidays.  You do have access to a nurse 24-7, just call the main number to the clinic [phone] and do not press any options, hold on the line and a nurse will answer the phone.   ? ?For prescription refill requests, have your pharmacy contact our office and allow 72 hours.   ? ?Due to Covid, you will need to wear a mask upon entering the hospital. If you do not have a  mask, a mask will be given to you at the Main Entrance upon arrival. For doctor visits, patients may have 1 support person age 30 or older with them. For treatment visits, patients can not have anyone with them due to social distancing guidelines and our immunocompromised population.  ? ?   ?

## 2021-05-29 NOTE — Progress Notes (Signed)
Patient is taking tamoxifen as prescribed.  She has not missed any doses and reports no side effects at this time.  ? ?

## 2021-06-12 DIAGNOSIS — J069 Acute upper respiratory infection, unspecified: Secondary | ICD-10-CM | POA: Diagnosis not present

## 2021-06-18 ENCOUNTER — Other Ambulatory Visit (HOSPITAL_COMMUNITY): Payer: Self-pay

## 2021-06-26 ENCOUNTER — Ambulatory Visit (INDEPENDENT_AMBULATORY_CARE_PROVIDER_SITE_OTHER): Payer: 59 | Admitting: Gastroenterology

## 2021-06-26 ENCOUNTER — Other Ambulatory Visit (HOSPITAL_COMMUNITY): Payer: Self-pay

## 2021-06-26 VITALS — BP 118/80 | HR 70 | Temp 97.1°F | Ht 67.0 in | Wt 195.2 lb

## 2021-06-26 DIAGNOSIS — K219 Gastro-esophageal reflux disease without esophagitis: Secondary | ICD-10-CM | POA: Diagnosis not present

## 2021-06-26 MED ORDER — OMEPRAZOLE 40 MG PO CPDR
40.0000 mg | DELAYED_RELEASE_CAPSULE | Freq: Every day | ORAL | 3 refills | Status: DC
  Filled 2021-06-26: qty 90, 90d supply, fill #0
  Filled 2021-10-07: qty 90, 90d supply, fill #1
  Filled 2022-01-03: qty 90, 90d supply, fill #2
  Filled 2022-03-18: qty 90, 90d supply, fill #3

## 2021-06-26 NOTE — Progress Notes (Signed)
? ? ? ? ?Gastroenterology Office Note   ? ? ?Primary Care Physician:  Pablo Lawrence, NP  ?Primary Gastroenterologist: Dr. Gala Romney  ? ? ?Chief Complaint  ? ?Chief Complaint  ?Patient presents with  ? Follow-up  ? ? ? ?History of Present Illness  ? ?Lorraine Peters is a 51 y.o. female presenting today in follow-up with a history of GERD, multiple adenomas with surveillance due in 2023, abdominal pain s/p EGD in July 2019. RUQ [place] July 2019 with fatty liver, no gallstones.  ? ?Returning today in routine follow-up. Omeprazole 40 mg daily for GERD. No dysphagia. Recent bout with diarrhea while taking elderberry but has stopped this now and back to taking Miralax once daily. Works well for her. No abdominal pain. No other concerns.  ? ? ? ? ? ? ?Past Medical History:  ?Diagnosis Date  ? Breast cancer ([place])   ? Breast disorder   ? left breast at age 89  ? Family history of breast cancer   ? Family history of colon cancer   ? H. pylori infection   ? History of kidney stones   ? Hypercholesterolemia   ? Hypertension   ? Migraine   ? Ovarian cyst   ? Personal history of radiation therapy   ? PONV (postoperative nausea and vomiting)   ? Reflux   ? right 05/23/2020  ? right  ? ? ?Past Surgical History:  ?Procedure Laterality Date  ? BREAST BIOPSY Left 04/2019  ? BREAST BIOPSY Right 05/23/2020  ? BREAST LUMPECTOMY Left 2020  ? COLONOSCOPY N/A 12/30/2018  ? six 4-7 mm polyps in rectum, descending colon, and ascending colon. Sigmoid and descending colon diverticulosis. Serrated and tubular adenoma. Surveillance 3 years.   ? ESOPHAGOGASTRODUODENOSCOPY N/A 09/12/2017  ? normal esophagus, small hiatal hernia, large pedunculated duodenal polyp s/p resection and clip placement. Benign polyp  ? PARTIAL MASTECTOMY WITH NEEDLE LOCALIZATION AND AXILLARY SENTINEL LYMPH NODE BX Left 04/29/2018  ? Procedure: PARTIAL MASTECTOMY WITH NEEDLE LOCALIZATION AND AXILLARY SENTINEL LYMPH NODE BX;  Surgeon: Virl Cagey, MD;  Location: [facility] ORS;   Service: General;  Laterality: Left;  ? POLYPECTOMY  12/30/2018  ? Procedure: POLYPECTOMY;  Surgeon: Daneil Dolin, MD;  Location: [facility] ENDO SUITE;  Service: Endoscopy;;  ascending,descending,rectal  ? TUBAL LIGATION    ? ? ?Current Outpatient Medications  ?Medication Sig Dispense Refill  ? ALPRAZolam (XANAX) 0.25 MG tablet Take 1 tablet (0.25 mg total) by mouth at bedtime as needed for anxiety. 30 tablet 2  ? atorvastatin (LIPITOR) 10 MG tablet Take 1 tablet (10 mg total) by mouth daily. 90 tablet 0  ? CALCIUM PO Take 2,400 mg by mouth daily.    ? fexofenadine (ALLEGRA) 180 MG tablet Take 180 mg by mouth at bedtime.    ? fluticasone (FLONASE) 50 MCG/ACT nasal spray Use 2 sprays in each nostril daily 16 g 5  ? hydrochlorothiazide (HYDRODIURIL) 25 MG tablet Take one tablet (25 mg dose) by mouth in the morning. (Patient taking differently: Take 25 mg by mouth daily.) 90 tablet 2  ? polyethylene glycol (MIRALAX / GLYCOLAX) 17 g packet Take 17 g by mouth daily.     ? Probiotic Product (PROBIOTIC PO) Take by mouth daily.    ? tamoxifen (NOLVADEX) 20 MG tablet Take 1 tablet (20 mg total) by mouth daily. 30 tablet 11  ? omeprazole (PRILOSEC) 40 MG capsule Take 1 capsule (40 mg total) by mouth daily. 90 capsule 3  ? ?No current facility-administered  medications for this visit.  ? ? ?Allergies as of 06/26/2021 - Review Complete 06/26/2021  ?Allergen Reaction Noted  ? Bee venom Swelling and Other (See Comments) 04/29/2011  ? ? ?Family History  ?Problem Relation Age of Onset  ? Arthritis Mother   ? Cancer Mother   ?     breast  ? Breast cancer Mother 64  ?     recurrance at 21  ? COPD Father   ? Heart disease Father   ? Hyperlipidemia Father   ? Hypertension Father   ? Cancer Sister 51  ?     anal, deceased due to mva but had advanced disease  ? Stroke Maternal Grandfather   ? Breast cancer Maternal Aunt 14  ? ? ?Social History  ? ?Socioeconomic History  ? Marital status: Divorced  ?  Spouse name: Not on file  ? Number of  children: 1  ? Years of education: Not on file  ? Highest education level: Not on file  ?Occupational History  ? Not on file  ?Tobacco Use  ? Smoking status: Former  ?  Packs/day: 0.50  ?  Years: 28.00  ?  Pack years: 14.00  ?  Types: Cigarettes  ?  Quit date: 04/22/2018  ?  Years since quitting: 3.1  ? Smokeless tobacco: Never  ?Vaping Use  ? Vaping Use: Never used  ?Substance and Sexual Activity  ? Alcohol use: No  ?  Alcohol/week: 0.0 standard drinks  ? Drug use: No  ? Sexual activity: Not Currently  ?  Birth control/protection: Surgical  ?  Comment: tubal  ?Other Topics Concern  ? Not on file  ?Social History Narrative  ? Not on file  ? ?Social Determinants of Health  ? ?Financial Resource Strain: Low Risk   ? Difficulty of Paying Living Expenses: Not hard at all  ?Food Insecurity: No Food Insecurity  ? Worried About Charity fundraiser in the Last Year: Never true  ? Ran Out of Food in the Last Year: Never true  ?Transportation Needs: No Transportation Needs  ? Lack of Transportation (Medical): No  ? Lack of Transportation (Non-Medical): No  ?Physical Activity: Insufficiently Active  ? Days of Exercise per Week: 2 days  ? Minutes of Exercise per Session: 30 min  ?Stress: No Stress Concern Present  ? Feeling of Stress : Not at all  ?Social Connections: Moderately Isolated  ? Frequency of Communication with Friends and Family: More than three times a week  ? Frequency of Social Gatherings with Friends and Family: Three times a week  ? Attends Religious Services: More than 4 times per year  ? Active Member of Clubs or Organizations: No  ? Attends Archivist Meetings: Never  ? Marital Status: Divorced  ?Intimate Partner Violence: Not At Risk  ? Fear of Current or Ex-Partner: No  ? Emotionally Abused: No  ? Physically Abused: No  ? Sexually Abused: No  ? ? ? ?Review of Systems  ? ?Gen: Denies any fever, chills, fatigue, weight loss, lack of appetite.  ?CV: Denies chest pain, heart palpitations, peripheral  edema, syncope.  ?Resp: Denies shortness of breath at rest or with exertion. Denies wheezing or cough.  ?GI: see HPI ?GU : Denies urinary burning, urinary frequency, urinary hesitancy ?MS: Denies joint pain, muscle weakness, cramps, or limitation of movement.  ?Derm: Denies rash, itching, dry skin ?Psych: Denies depression, anxiety, memory loss, and confusion ?Heme: Denies bruising, bleeding, and enlarged lymph nodes. ? ? ?Physical  Exam  ? ?BP 118/80   Pulse 70   Temp (!) 97.1 ?F (36.2 ?C)   Ht '5\' 7"'$  (1.702 m)   Wt 195 lb 3.2 oz (88.5 kg)   LMP 04/04/2019 (Approximate)   BMI 30.57 kg/m?  ?General:   Alert and oriented. Pleasant and cooperative. Well-nourished and well-developed.  ?Head:  Normocephalic and atraumatic. ?Eyes:  Without icterus ?Abdomen:  +BS, soft, non-tender and non-distended. No HSM noted. No guarding or rebound. No masses appreciated.  ?Rectal:  Deferred  ?Msk:  Symmetrical without gross deformities. Normal posture. ?Extremities:  Without edema. ?Neurologic:  Alert and  oriented x4;  grossly normal neurologically. ?Skin:  Intact without significant lesions or rashes. ?Psych:  Alert and cooperative. Normal mood and affect. ? ? ?Assessment  ? ?Lorraine Peters is a 51 y.o. female presenting today in follow-up with a history of GERD, constipation, multiple adenomas with surveillance due in fall 2023, now here for follow-up.  ? ?GERD: doing well on omeprazole daily. No alarm signs/symptoms. ? ?History of adenomas: due for surveillance Oct 2023. No concerning lower GI signs/symptoms. ? ?Constipation: doing well with Miralax daily.  ? ? ? ?PLAN  ? ? ?Omeprazole refilled ?Miralax daily ?Return in 2 years ?Triage in Aug/Sept for colonoscopy ? ? ?Annitta Needs, PhD, ANP-BC ?[facility]  ? ? ? ?

## 2021-06-26 NOTE — Patient Instructions (Signed)
I have refilled omeprazole for you! ? ?We can see you back in 2 years! ? ?We will call you around August/September to triage for colonoscopy. ? ?Please let me know if you need anything! I am glad you are doing well! ? ?I enjoyed seeing you again today! As you know, I value our relationship and want to provide genuine, compassionate, and quality care. I welcome your feedback. If you receive a survey regarding your visit,  I greatly appreciate you taking time to fill this out. See you next time! ? ?Annitta Needs, PhD, ANP-BC ?[facility]  ? ?

## 2021-07-02 ENCOUNTER — Other Ambulatory Visit (HOSPITAL_COMMUNITY): Payer: Self-pay

## 2021-07-02 MED ORDER — OLMESARTAN MEDOXOMIL 40 MG PO TABS
40.0000 mg | ORAL_TABLET | Freq: Every day | ORAL | 1 refills | Status: DC
  Filled 2021-07-02: qty 90, 90d supply, fill #0
  Filled 2021-10-07: qty 90, 90d supply, fill #1

## 2021-07-19 ENCOUNTER — Other Ambulatory Visit (HOSPITAL_COMMUNITY): Payer: Self-pay

## 2021-07-19 MED ORDER — ATORVASTATIN CALCIUM 10 MG PO TABS
10.0000 mg | ORAL_TABLET | Freq: Every day | ORAL | 0 refills | Status: DC
  Filled 2021-07-19: qty 90, 90d supply, fill #0

## 2021-07-19 MED ORDER — HYDROCHLOROTHIAZIDE 25 MG PO TABS
25.0000 mg | ORAL_TABLET | ORAL | 2 refills | Status: DC
  Filled 2021-07-19: qty 90, 90d supply, fill #0
  Filled 2021-10-07: qty 90, 90d supply, fill #1
  Filled 2022-01-03: qty 90, 90d supply, fill #2

## 2021-08-13 ENCOUNTER — Encounter (HOSPITAL_COMMUNITY): Payer: Self-pay

## 2021-08-13 ENCOUNTER — Other Ambulatory Visit (HOSPITAL_COMMUNITY): Payer: Self-pay

## 2021-08-13 DIAGNOSIS — D0512 Intraductal carcinoma in situ of left breast: Secondary | ICD-10-CM

## 2021-08-13 NOTE — Progress Notes (Signed)
Order placed for breast US per Dr. Delton Coombes

## 2021-08-13 NOTE — Progress Notes (Signed)
Upon further discussion, breast US not needed per Dr. Delton Coombes. Continue yearly mammograms.

## 2021-08-20 ENCOUNTER — Other Ambulatory Visit (HOSPITAL_COMMUNITY): Payer: Self-pay

## 2021-08-20 ENCOUNTER — Other Ambulatory Visit (HOSPITAL_COMMUNITY): Payer: Self-pay | Admitting: Hematology

## 2021-08-20 MED ORDER — ALPRAZOLAM 0.25 MG PO TABS
0.2500 mg | ORAL_TABLET | Freq: Every evening | ORAL | 2 refills | Status: AC | PRN
  Filled 2021-08-20: qty 30, 30d supply, fill #0

## 2021-09-17 ENCOUNTER — Other Ambulatory Visit (HOSPITAL_COMMUNITY): Payer: Self-pay

## 2021-09-24 ENCOUNTER — Ambulatory Visit: Payer: 59 | Admitting: Obstetrics & Gynecology

## 2021-10-07 ENCOUNTER — Other Ambulatory Visit (HOSPITAL_COMMUNITY): Payer: Self-pay

## 2021-10-08 ENCOUNTER — Other Ambulatory Visit (HOSPITAL_COMMUNITY): Payer: Self-pay

## 2021-10-08 MED ORDER — ATORVASTATIN CALCIUM 10 MG PO TABS
10.0000 mg | ORAL_TABLET | Freq: Every day | ORAL | 0 refills | Status: DC
  Filled 2021-10-08: qty 90, 90d supply, fill #0

## 2021-10-22 ENCOUNTER — Other Ambulatory Visit (HOSPITAL_COMMUNITY): Payer: Self-pay

## 2021-10-23 ENCOUNTER — Other Ambulatory Visit (HOSPITAL_COMMUNITY): Payer: Self-pay

## 2021-10-29 ENCOUNTER — Other Ambulatory Visit: Payer: Self-pay | Admitting: Adult Health Nurse Practitioner

## 2021-10-29 ENCOUNTER — Other Ambulatory Visit (HOSPITAL_COMMUNITY): Payer: Self-pay | Admitting: Adult Health Nurse Practitioner

## 2021-10-29 DIAGNOSIS — R7303 Prediabetes: Secondary | ICD-10-CM | POA: Diagnosis not present

## 2021-10-29 DIAGNOSIS — Z853 Personal history of malignant neoplasm of breast: Secondary | ICD-10-CM | POA: Diagnosis not present

## 2021-10-29 DIAGNOSIS — E782 Mixed hyperlipidemia: Secondary | ICD-10-CM | POA: Diagnosis not present

## 2021-10-29 DIAGNOSIS — I10 Essential (primary) hypertension: Secondary | ICD-10-CM | POA: Diagnosis not present

## 2021-10-29 DIAGNOSIS — N6011 Diffuse cystic mastopathy of right breast: Secondary | ICD-10-CM | POA: Diagnosis not present

## 2021-10-29 DIAGNOSIS — Z0000 Encounter for general adult medical examination without abnormal findings: Secondary | ICD-10-CM | POA: Diagnosis not present

## 2021-10-29 DIAGNOSIS — E049 Nontoxic goiter, unspecified: Secondary | ICD-10-CM | POA: Diagnosis not present

## 2021-10-29 DIAGNOSIS — E6609 Other obesity due to excess calories: Secondary | ICD-10-CM | POA: Diagnosis not present

## 2021-10-29 DIAGNOSIS — N6012 Diffuse cystic mastopathy of left breast: Secondary | ICD-10-CM | POA: Diagnosis not present

## 2021-11-01 ENCOUNTER — Ambulatory Visit (HOSPITAL_COMMUNITY)
Admission: RE | Admit: 2021-11-01 | Discharge: 2021-11-01 | Disposition: A | Discharge status: Home / Self Care | Payer: 59 | Source: Ambulatory Visit | Attending: Adult Health Nurse Practitioner | Admitting: Adult Health Nurse Practitioner

## 2021-11-01 DIAGNOSIS — E041 Nontoxic single thyroid nodule: Secondary | ICD-10-CM | POA: Diagnosis not present

## 2021-11-01 DIAGNOSIS — E049 Nontoxic goiter, unspecified: Secondary | ICD-10-CM | POA: Insufficient documentation

## 2021-11-02 ENCOUNTER — Other Ambulatory Visit: Payer: Self-pay | Admitting: Adult Health Nurse Practitioner

## 2021-11-02 DIAGNOSIS — N6011 Diffuse cystic mastopathy of right breast: Secondary | ICD-10-CM

## 2021-11-02 DIAGNOSIS — Z853 Personal history of malignant neoplasm of breast: Secondary | ICD-10-CM

## 2021-11-02 DIAGNOSIS — N6012 Diffuse cystic mastopathy of left breast: Secondary | ICD-10-CM

## 2021-11-19 ENCOUNTER — Encounter: Payer: Self-pay | Admitting: Obstetrics & Gynecology

## 2021-11-19 ENCOUNTER — Other Ambulatory Visit: Payer: 59

## 2021-11-19 ENCOUNTER — Ambulatory Visit (INDEPENDENT_AMBULATORY_CARE_PROVIDER_SITE_OTHER): Payer: 59 | Admitting: Obstetrics & Gynecology

## 2021-11-19 ENCOUNTER — Other Ambulatory Visit: Payer: Self-pay | Admitting: Hematology

## 2021-11-19 ENCOUNTER — Other Ambulatory Visit (HOSPITAL_COMMUNITY): Payer: Self-pay

## 2021-11-19 VITALS — BP 120/84 | HR 71 | Ht 67.0 in | Wt 191.8 lb

## 2021-11-19 DIAGNOSIS — Z1231 Encounter for screening mammogram for malignant neoplasm of breast: Secondary | ICD-10-CM

## 2021-11-19 DIAGNOSIS — Z01419 Encounter for gynecological examination (general) (routine) without abnormal findings: Secondary | ICD-10-CM

## 2021-11-19 NOTE — Progress Notes (Signed)
WELL-WOMAN EXAMINATION Patient name: Lorraine Peters MRN [id number]  Date of birth: 1970-08-14 Chief Complaint:   Gynecologic Exam  History of Present Illness:   Lorraine Peters is a 51 y.o. G1P0101 PM with h/o breast Ca currently on Tamoxifen being seen today for a routine well-woman exam and follow up regarding:.   Ovarian cyst/pelvic pain: notes continued pelvic pain- mostly cramping on her left side.  Rates pain 6/10, taking tylenol.  Depends as to whether the medication helps or note.  No nausea/vomiting.  [place] was scheduled for today, but had to be moved to early October.  Occasional clear discharge- no odor or itching.  Denies vaginal bleeding.   Patient's last menstrual period was 09/22/2018 (approximate).  Last pap 09/2019.  Last mammogram: 05/2021. Last colonoscopy: 2020     11/19/2021    9:38 AM 10/26/2020   11:12 AM 09/28/2019    8:40 AM 05/05/2019    4:15 PM  Depression screen PHQ 2/9  Decreased Interest 0 0 0 0  Down, Depressed, Hopeless 0 0 0 0  PHQ - 2 Score 0 0 0 0  Altered sleeping 1 0 0   Tired, decreased energy 1 0 0   Change in appetite 1 0 0   Feeling bad or failure about yourself  0 0 0   Trouble concentrating 0 0 0   Moving slowly or fidgety/restless 0 0 0   Suicidal thoughts 0 0 0   PHQ-9 Score 3 0 0       Review of Systems:   Pertinent items are noted in HPI Denies any headaches, blurred vision, fatigue, shortness of breath, chest pain, abdominal pain, bowel movements, urination, or intercourse unless otherwise stated above.  Pertinent History Reviewed:  Reviewed past medical,surgical, social and family history.  Reviewed problem list, medications and allergies. Physical Assessment:   Vitals:   11/19/21 0924  BP: 120/84  Pulse: 71  Weight: 191 lb 12.8 oz (87 kg)  Height: '5\' 7"'$  (1.702 m)  Body mass index is 30.04 kg/m.        Physical Examination:   General appearance - well appearing, and in no distress  Mental status - alert, oriented to  person, place, and time  Psych:  She has a normal mood and affect  Skin - warm and dry, normal color, no suspicious lesions noted  Chest - effort normal, all lung fields clear to auscultation bilaterally  Heart - normal rate and regular rhythm  Neck:  midline trachea, no thyromegaly or nodules  Breasts - Normal right breast, left breast with edema/firmness noted inner quadrant- present since surgery- no new changes appreciated.  No axillary lymphadenopathy  Abdomen - soft, nontender, nondistended, no masses or organomegaly- no reproducible pain  Pelvic - VULVA: normal appearing vulva with no masses, tenderness or lesions  VAGINA: normal appearing vagina with normal color and discharge, no lesions  CERVIX: normal appearing cervix without discharge or lesions, no CMT  UTERUS: uterus is felt to be normal size, shape, consistency and nontender   ADNEXA: No adnexal masses or tenderness noted  Extremities:  No swelling or varicosities noted  Chaperone:  pt declined      Assessment & Plan:  1) Well-Woman Exam -screening exams up to date -next pap due 2024  2) h/o ovarian cysts, pelvic pain -follow up as scheduled for next ultrasound -further management pending results of [place] and will be addressed at her next visit   Meds: No orders of the defined types were  placed in this encounter.   Follow-up: Return for as scheduled.   Janyth Pupa, DO Attending [place], [facility] for [employer], [place]

## 2021-11-25 ENCOUNTER — Other Ambulatory Visit (HOSPITAL_COMMUNITY): Payer: Self-pay

## 2021-11-26 ENCOUNTER — Other Ambulatory Visit (HOSPITAL_COMMUNITY): Payer: Self-pay

## 2021-11-26 ENCOUNTER — Encounter: Payer: Self-pay | Admitting: *Deleted

## 2021-11-26 MED ORDER — FLUTICASONE PROPIONATE 50 MCG/ACT NA SUSP
2.0000 | Freq: Every day | NASAL | 5 refills | Status: DC
  Filled 2021-11-26: qty 16, 30d supply, fill #0

## 2021-12-04 ENCOUNTER — Other Ambulatory Visit: Payer: Self-pay | Admitting: Obstetrics & Gynecology

## 2021-12-04 DIAGNOSIS — Z853 Personal history of malignant neoplasm of breast: Secondary | ICD-10-CM

## 2021-12-04 DIAGNOSIS — R102 Pelvic and perineal pain: Secondary | ICD-10-CM

## 2021-12-05 ENCOUNTER — Ambulatory Visit (INDEPENDENT_AMBULATORY_CARE_PROVIDER_SITE_OTHER): Payer: 59

## 2021-12-05 ENCOUNTER — Encounter: Payer: Self-pay | Admitting: Obstetrics & Gynecology

## 2021-12-05 ENCOUNTER — Ambulatory Visit: Payer: 59 | Admitting: Obstetrics & Gynecology

## 2021-12-05 VITALS — BP 142/84 | HR 75 | Ht 67.0 in

## 2021-12-05 DIAGNOSIS — N83201 Unspecified ovarian cyst, right side: Secondary | ICD-10-CM | POA: Diagnosis not present

## 2021-12-05 DIAGNOSIS — R102 Pelvic and perineal pain: Secondary | ICD-10-CM

## 2021-12-05 DIAGNOSIS — Z853 Personal history of malignant neoplasm of breast: Secondary | ICD-10-CM | POA: Diagnosis not present

## 2021-12-05 DIAGNOSIS — N83202 Unspecified ovarian cyst, left side: Secondary | ICD-10-CM | POA: Diagnosis not present

## 2021-12-05 NOTE — Progress Notes (Signed)
PELVIC US TA/TV:homogeneous anteverted uterus with a small posterior fundal fibroid 5 x 4 x 5 mm,homogeneous thickened endometrium (difficult to visualize boarders),EEC 20 mm,normal ovaries with multiple small echogenic foci,ovaries appear mobile,left adnexal pain during ultrasound,no free fluid  Chaperone Peggy

## 2021-12-05 NOTE — Progress Notes (Signed)
   GYN VISIT Patient name: Lorraine Peters MRN [id number]  Date of birth: 06-19-1970 Chief Complaint:   Follow-up (U/s today)  History of Present Illness:   Lorraine Peters is a 51 y.o. G18P0101 PM female being seen today for follow up regarding:  Bilateral ovarian cyst: During her annual she had reported left-sided pelvic pain 6/10.  Today she notes that the pain has mostly resolved.  She did not discomfort during the exam, but denies regular pain on the left.  She may have it every now and then.  Denies vaginal bleeding, discharge or other acute gyn concern.  homogeneous anteverted uterus with a small posterior fundal fibroid 5 x 4 x 5 mm,homogeneous thickened endometrium (difficult to visualize boarders),EEC 20 mm,normal ovaries with multiple small echogenic foci,ovaries appear mobile,left adnexal pain during ultrasound,no free fluid  Last [place] 11/2020: 12.6 mm,saline outlined the smooth symmetrical wall of the endometrium,no solitary mass noted within the endometrial cavity,simple right ovarian cyst 3.8 x 2 x 2.6 cm,four simple left ovarian cysts (1) 3.9 x 3.8 x 2.7 cm,(2) 2.6 x 1.4 x 1.6 cm,(3) 1.5 x 1.6 x 1.4 cm,(4) 2 x 1.5 x 1.9 cm,no free fluid   Patient's last menstrual period was 09/22/2018 (approximate).     11/19/2021    9:38 AM 10/26/2020   11:12 AM 09/28/2019    8:40 AM 05/05/2019    4:15 PM  Depression screen PHQ 2/9  Decreased Interest 0 0 0 0  Down, Depressed, Hopeless 0 0 0 0  PHQ - 2 Score 0 0 0 0  Altered sleeping 1 0 0   Tired, decreased energy 1 0 0   Change in appetite 1 0 0   Feeling bad or failure about yourself  0 0 0   Trouble concentrating 0 0 0   Moving slowly or fidgety/restless 0 0 0   Suicidal thoughts 0 0 0   PHQ-9 Score 3 0 0      Review of Systems:   Pertinent items are noted in HPI Denies fever/chills, dizziness, headaches, visual disturbances, fatigue, shortness of breath, chest pain, abdominal pain, vomiting, no problems with bowel movements, urination,  or intercourse unless otherwise stated above.  Pertinent History Reviewed:  Reviewed past medical,surgical, social, obstetrical and family history.  Reviewed problem list, medications and allergies. Physical Assessment:   Vitals:   12/05/21 1105  BP: (!) 142/84  Pulse: 75  Height: '5\' 7"'$  (1.702 m)  Body mass index is 30.04 kg/m.       Physical Examination:   General appearance: alert, well appearing, and in no distress  Psych: mood appropriate, normal affect  Skin: warm & dry   Cardiovascular: normal heart rate noted  Respiratory: normal respiratory effort, no distress   Chaperone: N/A    Assessment & Plan:  1) Pelvic pain, h/o ovarian cysts -reviewed today's [place], prior cyst have now resolved -no further imaging and/or management indicated -f/u prn or 74yrfor annual   Return for Sept 2024.   JJanyth Pupa DO Attending [facility] [place]

## 2021-12-06 ENCOUNTER — Encounter: Payer: Self-pay | Admitting: *Deleted

## 2021-12-06 NOTE — Patient Instructions (Signed)
  Procedure: Colonoscopy  Estimated body mass index is 30.07 kg/m as calculated from the following:   Height as of this encounter: '5\' 7"'$  (1.702 m).   Weight as of this encounter: 192 lb (87.1 kg).   Have you had a colonoscopy before?  12/30/18, Dr. Gala Romney  Do you have family history of colon cancer  no  Do you have a family history of polyps? Not sure  Previous colonoscopy with polyps removed? yes  Do you have a history colorectal cancer?   no  Are you diabetic?  no  Do you have a prosthetic or mechanical heart valve? no  Do you have a pacemaker/defibrillator?   no  Have you had endocarditis/atrial fibrillation?  no  Do you use supplemental oxygen/CPAP?  no  Have you had joint replacement within the last 12 months?  no  Do you tend to be constipated or have to use laxatives?  yes   Do you have history of alcohol use? If yes, how much and how often.  no  Do you have history or are you using drugs? If yes, what do are you  using?  no  Have you ever had a stroke/heart attack?  no  Have you ever had a heart or other vascular stent placed,?no  Do you take weight loss medication? no  female patients,: have you had a hysterectomy? no                              are you post menopausal?  no                              do you still have your menstrual cycle? no    Date of last menstrual period. 4 years ago  Do you take any blood-thinning medications such as: (Plavix, aspirin, Coumadin, Aggrenox, Brilinta, Xarelto, Eliquis, Pradaxa, Savaysa or Effient) no  If yes we need the name, milligram, dosage and who is prescribing doctor:               Current Outpatient Medications  Medication Sig Dispense Refill   ALPRAZolam (XANAX) 0.25 MG tablet Take 1 tablet (0.25 mg total) by mouth at bedtime as needed for anxiety. 30 tablet 2   atorvastatin (LIPITOR) 10 MG tablet Take 1 tablet (10 mg total) by mouth daily. 90 tablet 0   CALCIUM PO Take 2,400 mg by mouth daily.     COLLAGEN  PO Take by mouth.     hydrochlorothiazide (HYDRODIURIL) 25 MG tablet Take 1 tablet (25 mg total) by mouth every morning. 90 tablet 2   levocetirizine (XYZAL) 5 MG tablet Take 5 mg by mouth every evening.     meclizine (ANTIVERT) 25 MG tablet Take 25 mg by mouth daily as needed for dizziness.     olmesartan (BENICAR) 40 MG tablet Take 1 tablet (40 mg total) by mouth daily. 90 tablet 1   omeprazole (PRILOSEC) 40 MG capsule Take 1 capsule (40 mg total) by mouth daily. 90 capsule 3   Probiotic Product (PROBIOTIC PO) Take by mouth daily.     tamoxifen (NOLVADEX) 20 MG tablet Take 1 tablet (20 mg total) by mouth daily. 30 tablet 11   No current facility-administered medications for this visit.    Allergies  Allergen Reactions   Bee Venom Swelling and Other (See Comments)    Severe swelling

## 2021-12-10 ENCOUNTER — Inpatient Hospital Stay: Payer: 59 | Attending: Hematology

## 2021-12-10 DIAGNOSIS — R5383 Other fatigue: Secondary | ICD-10-CM | POA: Insufficient documentation

## 2021-12-10 DIAGNOSIS — D509 Iron deficiency anemia, unspecified: Secondary | ICD-10-CM | POA: Insufficient documentation

## 2021-12-10 DIAGNOSIS — Z79810 Long term (current) use of selective estrogen receptor modulators (SERMs): Secondary | ICD-10-CM | POA: Insufficient documentation

## 2021-12-10 DIAGNOSIS — D0512 Intraductal carcinoma in situ of left breast: Secondary | ICD-10-CM | POA: Diagnosis not present

## 2021-12-10 LAB — CBC WITH DIFFERENTIAL/PLATELET
Abs Immature Granulocytes: 0.01 10*3/uL (ref 0.00–0.07)
Basophils Absolute: 0 10*3/uL (ref 0.0–0.1)
Basophils Relative: 0 %
Eosinophils Absolute: 0.1 10*3/uL (ref 0.0–0.5)
Eosinophils Relative: 1 %
HCT: 37.6 % (ref 36.0–46.0)
Hemoglobin: 12 g/dL (ref 12.0–15.0)
Immature Granulocytes: 0 %
Lymphocytes Relative: 35 %
Lymphs Abs: 2 10*3/uL (ref 0.7–4.0)
MCH: 26.7 pg (ref 26.0–34.0)
MCHC: 31.9 g/dL (ref 30.0–36.0)
MCV: 83.6 fL (ref 80.0–100.0)
Monocytes Absolute: 0.4 10*3/uL (ref 0.1–1.0)
Monocytes Relative: 7 %
Neutro Abs: 3.3 10*3/uL (ref 1.7–7.7)
Neutrophils Relative %: 57 %
Platelets: 257 10*3/uL (ref 150–400)
RBC: 4.5 MIL/uL (ref 3.87–5.11)
RDW: 14.4 % (ref 11.5–15.5)
WBC: 5.8 10*3/uL (ref 4.0–10.5)
nRBC: 0 % (ref 0.0–0.2)

## 2021-12-10 LAB — COMPREHENSIVE METABOLIC PANEL
ALT: 14 U/L (ref 0–44)
AST: 17 U/L (ref 15–41)
Albumin: 3.3 g/dL — ABNORMAL LOW (ref 3.5–5.0)
Alkaline Phosphatase: 49 U/L (ref 38–126)
Anion gap: 8 (ref 5–15)
BUN: 15 mg/dL (ref 6–20)
CO2: 26 mmol/L (ref 22–32)
Calcium: 8.9 mg/dL (ref 8.9–10.3)
Chloride: 105 mmol/L (ref 98–111)
Creatinine, Ser: 0.45 mg/dL (ref 0.44–1.00)
GFR, Estimated: >60 mL/min (ref >60)
Glucose, Bld: 107 mg/dL — ABNORMAL HIGH (ref 70–99)
Potassium: 3.7 mmol/L (ref 3.5–5.1)
Sodium: 139 mmol/L (ref 135–145)
Total Bilirubin: 0.1 mg/dL — ABNORMAL LOW (ref 0.3–1.2)
Total Protein: 6.3 g/dL — ABNORMAL LOW (ref 6.5–8.1)

## 2021-12-10 LAB — IRON AND TIBC
Iron: 42 ug/dL (ref 28–170)
Saturation Ratios: 10 % — ABNORMAL LOW (ref 10.4–31.8)
TIBC: 412 ug/dL (ref 250–450)
UIBC: 370 ug/dL

## 2021-12-10 LAB — VITAMIN D 25 HYDROXY (VIT D DEFICIENCY, FRACTURES): Vit D, 25-Hydroxy: 38.09 ng/mL (ref 30–100)

## 2021-12-10 LAB — FERRITIN: Ferritin: 4 ng/mL — ABNORMAL LOW (ref 11–307)

## 2021-12-12 ENCOUNTER — Ambulatory Visit: Payer: 59 | Admitting: Obstetrics & Gynecology

## 2021-12-12 ENCOUNTER — Other Ambulatory Visit: Payer: 59

## 2021-12-17 ENCOUNTER — Other Ambulatory Visit (HOSPITAL_COMMUNITY): Payer: Self-pay

## 2021-12-17 ENCOUNTER — Other Ambulatory Visit (HOSPITAL_COMMUNITY): Payer: Self-pay | Admitting: Hematology

## 2021-12-17 ENCOUNTER — Inpatient Hospital Stay (HOSPITAL_BASED_OUTPATIENT_CLINIC_OR_DEPARTMENT_OTHER): Payer: 59 | Admitting: Hematology

## 2021-12-17 ENCOUNTER — Other Ambulatory Visit: Payer: Self-pay | Admitting: *Deleted

## 2021-12-17 VITALS — BP 124/84 | HR 95 | Temp 98.9°F | Resp 18 | Ht 67.0 in | Wt 192.5 lb

## 2021-12-17 DIAGNOSIS — Z79810 Long term (current) use of selective estrogen receptor modulators (SERMs): Secondary | ICD-10-CM | POA: Diagnosis not present

## 2021-12-17 DIAGNOSIS — D0512 Intraductal carcinoma in situ of left breast: Secondary | ICD-10-CM

## 2021-12-17 DIAGNOSIS — D509 Iron deficiency anemia, unspecified: Secondary | ICD-10-CM | POA: Diagnosis not present

## 2021-12-17 DIAGNOSIS — D508 Other iron deficiency anemias: Secondary | ICD-10-CM | POA: Diagnosis not present

## 2021-12-17 DIAGNOSIS — R5383 Other fatigue: Secondary | ICD-10-CM | POA: Diagnosis not present

## 2021-12-17 MED ORDER — TAMOXIFEN CITRATE 20 MG PO TABS
20.0000 mg | ORAL_TABLET | Freq: Every day | ORAL | 11 refills | Status: DC
  Filled 2021-12-17 (×2): qty 30, 30d supply, fill #0
  Filled 2022-01-22: qty 30, 30d supply, fill #1
  Filled 2022-02-20: qty 30, 30d supply, fill #2
  Filled 2022-03-18: qty 30, 30d supply, fill #3
  Filled 2022-04-24: qty 30, 30d supply, fill #0
  Filled 2022-05-20 – 2022-05-22 (×3): qty 30, 30d supply, fill #1
  Filled 2022-06-18 (×2): qty 30, 30d supply, fill #2
  Filled 2022-07-17: qty 30, 30d supply, fill #3
  Filled 2022-08-12: qty 30, 30d supply, fill #4
  Filled 2022-09-18: qty 30, 30d supply, fill #5
  Filled 2022-10-16: qty 30, 30d supply, fill #6
  Filled 2022-11-18 (×2): qty 30, 30d supply, fill #7

## 2021-12-17 NOTE — Telephone Encounter (Signed)
Tamoxifen refill approved.  Patient is tolerating and is to continue therapy. 

## 2021-12-17 NOTE — Progress Notes (Signed)
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  The Common Hereditary Gene Panel offered by [facility] includes sequencing and/or deletion duplication testing of the following 48 genes: APC, ATM, AXIN2, BARD1, BMPR1A, BRCA1, BRCA2, BRIP1, CDH1, CDK4, CDKN2A (p14ARF), CDKN2A (p16INK4a), CHEK2, CTNNA1, DICER1, EPCAM (Deletion/duplication testing only), GREM1 (promoter region deletion/duplication testing only), KIT, MEN1, MLH1, MSH2, MSH3, MSH6, MUTYH, NBN, NF1, NHTL1, PALB2, PDGFRA, PMS2, POLD1, POLE, PTEN, RAD50, RAD51C, RAD51D, RNF43, SDHB, SDHC, SDHD, SMAD4, SMARCA4. STK11, TP53, TSC1, TSC2, and VHL.  The following genes were evaluated for sequence changes only: SDHA and HOXB13 c.251G>A variant only. The report date is October 30, 2018.     CHIEF COMPLIANT: Follow-up for left breast high-grade DCIS   INTERVAL HISTORY: [name] is a 51 y.o. female seen for follow-up of left breast DCIS.  She is tolerating tamoxifen very well.  Denies any vaginal bleeding or spotting.  Chronic dizziness from vertigo is stable.  Energy levels are 50%.  REVIEW OF SYSTEMS:   Review of Systems  Constitutional:  Negative for appetite change and fatigue.  Genitourinary:  Negative for vaginal bleeding.   Neurological:  Positive for dizziness and numbness.  Psychiatric/Behavioral:  Positive for sleep disturbance.   All other systems reviewed and are negative.   I have reviewed the past medical history, past surgical  history, social history and family history with the patient and they are unchanged from previous note.   ALLERGIES:   is allergic to bee venom.   MEDICATIONS:  Current Outpatient Medications  Medication Sig Dispense Refill   ALPRAZolam (XANAX) 0.25 MG tablet Take 1 tablet (0.25 mg total) by mouth at bedtime as needed for anxiety. 30 tablet 2   atorvastatin (LIPITOR) 10 MG tablet Take 1 tablet (10 mg total) by mouth daily. 90 tablet 0   CALCIUM PO Take 2,400 mg by mouth daily.     COLLAGEN PO Take by mouth.     hydrochlorothiazide (HYDRODIURIL) 25 MG tablet Take 1 tablet (25 mg total) by mouth every morning. 90 tablet 2   levocetirizine (XYZAL) 5 MG tablet Take 5 mg by mouth every evening.     meclizine (ANTIVERT) 25 MG tablet Take 25 mg by mouth daily as needed for dizziness.     olmesartan (BENICAR) 40 MG tablet Take 1 tablet (40 mg total) by mouth daily. 90 tablet 1   omeprazole (PRILOSEC) 40 MG capsule Take 1 capsule (40 mg total) by mouth daily. 90 capsule 3   Probiotic Product (PROBIOTIC PO) Take by mouth daily.     tamoxifen (NOLVADEX) 20 MG tablet Take 1 tablet (20 mg total) by mouth daily. 30 tablet 11   No current facility-administered medications for this visit.     PHYSICAL EXAMINATION: Performance status (ECOG): 0 - Asymptomatic  Vitals:   12/17/21 0812  BP: 124/84  Pulse: 95  Resp: 18  Temp: 98.9 F (37.2 C)  SpO2: 95%   Wt Readings from Last 3 Encounters:  12/17/21 192 lb 8 oz (87.3 kg)  12/06/21 192 lb (87.1  kg)  11/19/21 191 lb 12.8 oz (87 kg)   Physical Exam Vitals reviewed.  Constitutional:      Appearance: Normal appearance.  Cardiovascular:     Rate and Rhythm: Normal rate and regular rhythm.     Pulses: Normal pulses.     Heart sounds: Normal heart sounds.  Pulmonary:     Effort: Pulmonary effort is normal.     Breath sounds: Normal breath sounds.  Neurological:     General: No focal deficit present.     Mental Status: She is alert and  oriented to person, place, and time.  Psychiatric:        Mood and Affect: Mood normal.        Behavior: Behavior normal.    Breast Exam Chaperone: Thana Ates     LABORATORY DATA:  I have reviewed the data as listed    Latest Ref Rng & Units 12/10/2021    9:33 AM 05/22/2021    9:32 AM 11/17/2020   10:52 AM  CMP  Glucose 70 - 99 mg/dL 107  106  120   BUN 6 - 20 mg/dL _0 Creatinine 0.44 - 1.00 mg/dL 0.45  0.48  0.52   Sodium 135 - 145 mmol/L 139  138  136   Potassium 3.5 - 5.1 mmol/L 3.7  3.3  3.7   Chloride 98 - 111 mmol/L 105  104  104   CO2 22 - 32 mmol/L _1 Calcium 8.9 - 10.3 mg/dL 8.9  8.7  8.4   Total Protein 6.5 - 8.1 g/dL 6.3  6.4  6.3   Total Bilirubin 0.3 - 1.2 mg/dL 0.1  0.3  0.2   Alkaline Phos 38 - 126 U/L 49  52  51   AST 15 - 41 U/L _2 ALT 0 - 44 U/L _3 No results found for: "CAN153" Lab Results  Component Value Date   WBC 5.8 12/10/2021   HGB 12.0 12/10/2021   HCT 37.6 12/10/2021   MCV 83.6 12/10/2021   PLT 257 12/10/2021   NEUTROABS 3.3 12/10/2021    ASSESSMENT:  1.  Left breast high-grade DCIS: -Lumpectomy on 04/29/2018, 0.5 cm high-grade DCIS, ER 70%, PR 80%, margins negative, 0/1 lymph node positive, pTis PN 0. -She received radiation therapy. -Tamoxifen started on 05/12/2018. -Bilateral mammogram on 04/06/2019 showed left breast calcifications. -Left upper outer quadrant needle biopsy on 04/15/2018 shows benign breast tissue with calcifications. -She had mammogram of the bilateral breasts on 05/15/2020 which showed indeterminate 4 mm group of calcifications in the upper outer quadrant of the right breast. -She had biopsy on 05/23/2020 which was consistent with fibrocystic change with calcifications, usual ductal hyperplasia and sclerosing adenosis. -Endometrial biopsy on 02/14/2020 shows benign endometrial type polyp with proliferative endometrium with no hyperplasia or malignancy.  She denies any spotting at this  time.   2.  Mild hypoproteinemia: -She has mild hypoalbuminemia dating back several years.  She also has mild leg swellings. -She was instructed to eat high-protein diet.   3.  Family history: -Mother had breast and lung cancer.  Maternal aunt had breast cancer.  Maternal uncle had colon cancer.  Sister had anal cancer. -Genetic testing was negative.   PLAN:  1.  Left breast high-grade DCIS: - She is tolerating tamoxifen reasonably well. - Mammogram on 05/16/2021 was BI-RADS 0 with new benign cyst in  the upper inner right breast.  Breast ultrasound showed new benign cyst.  Overall this is BI-RADS Category 2. - Reviewed labs today which showed normal LFTs.  CBC was grossly normal. - We will arrange for next mammogram in March.   2.  Bone health: - Vitamin D level is 38.  Continue calcium and D supplements.   3.  Iron deficiency anemia: - Ferritin is low at 4 and percent saturation 10.  She complains of fatigue. - Denies any bleeding per rectum or melena.  She is on omeprazole. - She started taking Flintstone with iron.  She is taking MiraLAX every night. - I have told her to stop Flintstone and start taking iron tablet 325 mg daily.  We will repeat labs in 2 months.  If there is no improvement, will consider parenteral iron therapy.  She was also told to start taking Colace daily.  Breast Cancer therapy associated bone loss: I have recommended calcium, Vitamin D and weight bearing exercises.  Orders placed this encounter:  No orders of the defined types were placed in this encounter.   The patient has a good understanding of the overall plan. She agrees with it. She will call with any problems that may develop before the next visit here.  Derek Jack, MD [facility] [phone]     '

## 2021-12-17 NOTE — Patient Instructions (Addendum)
[place] at [facility] Discharge Instructions   You were seen and examined today by Dr. Delton Coombes.  He reviewed the results of your lab work which are mostly normal/stable. However your iron is very low. You should start taking iron over the counter 325 mg. We will check you back in a couple of months and repeat your iron levels. If it has not improved, we will arrange for you to have iron infusions. You will need to take a stool softener (Colace) daily along with Miralax to help prevent constipation from the iron tablet.   Continue Tamoxifen as prescribed.   Return as scheduled in 2 months.    Thank you for choosing [place] at [facility] to provide your oncology and hematology care.  To afford each patient quality time with our provider, please arrive at least 15 minutes before your scheduled appointment time.   If you have a lab appointment with the [facility] please come in thru the Main Entrance and check in at the main information desk.  You need to re-schedule your appointment should you arrive 10 or more minutes late.  We strive to give you quality time with our providers, and arriving late affects you and other patients whose appointments are after yours.  Also, if you no show three or more times for appointments you may be dismissed from the clinic at the providers discretion.     Again, thank you for choosing [facility].  Our hope is that these requests will decrease the amount of time that you wait before being seen by our physicians.       _____________________________________________________________  Should you have questions after your visit to [facility], please contact our office at [phone] and follow the prompts.  Our office hours are 8:00 a.m. and 4:30 p.m. Monday - Friday.  Please note that voicemails left after 4:00 p.m. may not be returned until the following business day.  We are  closed weekends and major holidays.  You do have access to a nurse 24-7, just call the main number to the clinic [phone] and do not press any options, hold on the line and a nurse will answer the phone.    For prescription refill requests, have your pharmacy contact our office and allow 72 hours.    Due to Covid, you will need to wear a mask upon entering the hospital. If you do not have a mask, a mask will be given to you at the Main Entrance upon arrival. For doctor visits, patients may have 1 support person age 40 or older with them. For treatment visits, patients can not have anyone with them due to social distancing guidelines and our immunocompromised population.

## 2021-12-18 ENCOUNTER — Other Ambulatory Visit (HOSPITAL_COMMUNITY): Payer: Self-pay

## 2021-12-18 NOTE — Progress Notes (Signed)
Looks like patient was recently found to have iron deficiency. Recommend getting her back in to see Vicente Males in the next few weeks to discuss iron deficiency as she may need EGD and colonoscopy.

## 2021-12-19 ENCOUNTER — Emergency Department (HOSPITAL_COMMUNITY): Payer: 59

## 2021-12-19 ENCOUNTER — Other Ambulatory Visit: Payer: Self-pay

## 2021-12-19 ENCOUNTER — Encounter (HOSPITAL_COMMUNITY): Payer: Self-pay | Admitting: Emergency Medicine

## 2021-12-19 ENCOUNTER — Emergency Department (HOSPITAL_COMMUNITY)
Admission: EM | Admit: 2021-12-19 | Discharge: 2021-12-20 | Disposition: A | Discharge status: Home / Self Care | Payer: 59 | Attending: Emergency Medicine | Admitting: Emergency Medicine

## 2021-12-19 DIAGNOSIS — R5383 Other fatigue: Secondary | ICD-10-CM | POA: Diagnosis not present

## 2021-12-19 DIAGNOSIS — Z853 Personal history of malignant neoplasm of breast: Secondary | ICD-10-CM | POA: Diagnosis not present

## 2021-12-19 DIAGNOSIS — R112 Nausea with vomiting, unspecified: Secondary | ICD-10-CM | POA: Insufficient documentation

## 2021-12-19 DIAGNOSIS — D509 Iron deficiency anemia, unspecified: Secondary | ICD-10-CM | POA: Diagnosis not present

## 2021-12-19 DIAGNOSIS — M47816 Spondylosis without myelopathy or radiculopathy, lumbar region: Secondary | ICD-10-CM | POA: Diagnosis not present

## 2021-12-19 DIAGNOSIS — Z79810 Long term (current) use of selective estrogen receptor modulators (SERMs): Secondary | ICD-10-CM | POA: Diagnosis not present

## 2021-12-19 DIAGNOSIS — R42 Dizziness and giddiness: Secondary | ICD-10-CM | POA: Insufficient documentation

## 2021-12-19 DIAGNOSIS — D0512 Intraductal carcinoma in situ of left breast: Secondary | ICD-10-CM | POA: Diagnosis not present

## 2021-12-19 LAB — COMPREHENSIVE METABOLIC PANEL
ALT: 18 U/L (ref 0–44)
AST: 19 U/L (ref 15–41)
Albumin: 3.6 g/dL (ref 3.5–5.0)
Alkaline Phosphatase: 50 U/L (ref 38–126)
Anion gap: 11 (ref 5–15)
BUN: 15 mg/dL (ref 6–20)
CO2: 23 mmol/L (ref 22–32)
Calcium: 8.9 mg/dL (ref 8.9–10.3)
Chloride: 101 mmol/L (ref 98–111)
Creatinine, Ser: 0.52 mg/dL (ref 0.44–1.00)
GFR, Estimated: >60 mL/min (ref >60)
Glucose, Bld: 135 mg/dL — ABNORMAL HIGH (ref 70–99)
Potassium: 2.6 mmol/L — CL (ref 3.5–5.1)
Sodium: 135 mmol/L (ref 135–145)
Total Bilirubin: 0.4 mg/dL (ref 0.3–1.2)
Total Protein: 6.4 g/dL — ABNORMAL LOW (ref 6.5–8.1)

## 2021-12-19 LAB — CBC WITH DIFFERENTIAL/PLATELET
Abs Immature Granulocytes: 0.02 10*3/uL (ref 0.00–0.07)
Basophils Absolute: 0 10*3/uL (ref 0.0–0.1)
Basophils Relative: 0 %
Eosinophils Absolute: 0.1 10*3/uL (ref 0.0–0.5)
Eosinophils Relative: 1 %
HCT: 35.9 % — ABNORMAL LOW (ref 36.0–46.0)
Hemoglobin: 11.6 g/dL — ABNORMAL LOW (ref 12.0–15.0)
Immature Granulocytes: 0 %
Lymphocytes Relative: 25 %
Lymphs Abs: 2.1 10*3/uL (ref 0.7–4.0)
MCH: 27 pg (ref 26.0–34.0)
MCHC: 32.3 g/dL (ref 30.0–36.0)
MCV: 83.5 fL (ref 80.0–100.0)
Monocytes Absolute: 0.5 10*3/uL (ref 0.1–1.0)
Monocytes Relative: 6 %
Neutro Abs: 5.7 10*3/uL (ref 1.7–7.7)
Neutrophils Relative %: 68 %
Platelets: 228 10*3/uL (ref 150–400)
RBC: 4.3 MIL/uL (ref 3.87–5.11)
RDW: 14.6 % (ref 11.5–15.5)
WBC: 8.3 10*3/uL (ref 4.0–10.5)
nRBC: 0 % (ref 0.0–0.2)

## 2021-12-19 LAB — TROPONIN I (HIGH SENSITIVITY)
Troponin I (High Sensitivity): <2 ng/L (ref <18)
Troponin I (High Sensitivity): 3 ng/L (ref <18)

## 2021-12-19 LAB — MAGNESIUM: Magnesium: 1.9 mg/dL (ref 1.7–2.4)

## 2021-12-19 LAB — LIPASE, BLOOD: Lipase: 34 U/L (ref 11–51)

## 2021-12-19 MED ORDER — MECLIZINE HCL 12.5 MG PO TABS
25.0000 mg | ORAL_TABLET | Freq: Once | ORAL | Status: AC
  Administered 2021-12-19: 25 mg via ORAL
  Filled 2021-12-19: qty 2

## 2021-12-19 MED ORDER — DIAZEPAM 5 MG/ML IJ SOLN
5.0000 mg | Freq: Once | INTRAMUSCULAR | Status: AC
  Administered 2021-12-19: 5 mg via INTRAVENOUS
  Filled 2021-12-19: qty 2

## 2021-12-19 MED ORDER — POTASSIUM CHLORIDE CRYS ER 20 MEQ PO TBCR
40.0000 meq | EXTENDED_RELEASE_TABLET | Freq: Once | ORAL | Status: AC
  Administered 2021-12-19: 40 meq via ORAL
  Filled 2021-12-19: qty 2

## 2021-12-19 MED ORDER — METOCLOPRAMIDE HCL 5 MG/ML IJ SOLN
5.0000 mg | Freq: Once | INTRAMUSCULAR | Status: AC
  Administered 2021-12-19: 5 mg via INTRAVENOUS
  Filled 2021-12-19: qty 2

## 2021-12-19 MED ORDER — POTASSIUM CHLORIDE 10 MEQ/100ML IV SOLN
10.0000 meq | INTRAVENOUS | Status: DC
  Administered 2021-12-19 – 2021-12-20 (×4): 10 meq via INTRAVENOUS
  Filled 2021-12-19 (×4): qty 100

## 2021-12-19 MED ORDER — ONDANSETRON HCL 4 MG/2ML IJ SOLN
4.0000 mg | Freq: Once | INTRAMUSCULAR | Status: AC
  Administered 2021-12-19: 4 mg via INTRAVENOUS
  Filled 2021-12-19: qty 2

## 2021-12-19 MED ORDER — LACTATED RINGERS IV BOLUS
1000.0000 mL | Freq: Once | INTRAVENOUS | Status: AC
  Administered 2021-12-19: 1000 mL via INTRAVENOUS

## 2021-12-19 NOTE — ED Notes (Signed)
Patient is resting comfortably. 

## 2021-12-19 NOTE — ED Provider Notes (Signed)
[facility] EMERGENCY DEPARTMENT Provider Note   CSN: [id number] Arrival date & time: 12/19/21  1919     History  Chief Complaint  Patient presents with   Dizziness   Nausea    Lorraine Peters is a 51 y.o. female.   Dizziness Associated symptoms: nausea and vomiting   Patient presents for nausea and dizziness.  Medical history includes GERD, breast cancer in remission, HLD, nephrolithiasis.  She is followed by Dr. Raliegh Ip, and was seen in his office 2 days ago.  She is on tamoxifen.  She was started on iron supplementation.  Over the past several years, she has had intermittent episodes of vertigo.  She does take meclizine as needed.  3 days ago, she had vertigo with nausea and vomiting.  This resolved that day.  She was in her normal state of health up until this evening.  Patient works here, at [employer], and reception.  This evening, shortly before her end of shift, she was sitting her desk when she experienced recurrence of vertigo.  She had associated nausea.  Coworker ultimately convinced her to check into the ED.  While she was awaiting bedding, she developed vomiting.  She states that her current episode is similar to prior episodes, however, more severe.     Home Medications Prior to Admission medications   Medication Sig Start Date End Date Taking? Authorizing Provider  ALPRAZolam (XANAX) 0.25 MG tablet Take 1 tablet (0.25 mg total) by mouth at bedtime as needed for anxiety. 08/20/21  Yes Derek Jack, MD  atorvastatin (LIPITOR) 10 MG tablet Take 1 tablet (10 mg total) by mouth daily. 10/08/21  Yes   CALCIUM PO Take 2,400 mg by mouth daily.   Yes [provider]  COLLAGEN PO Take 1 tablet by mouth 2 (two) times daily.   Yes [provider]  ferrous sulfate 325 (65 FE) MG EC tablet Take 325 mg by mouth daily with breakfast.   Yes [provider]  hydrochlorothiazide (HYDRODIURIL) 25 MG tablet Take 1 tablet (25 mg total) by mouth every  morning. 07/19/21  Yes   levocetirizine (XYZAL) 5 MG tablet Take 5 mg by mouth every evening.   Yes [provider]  meclizine (ANTIVERT) 25 MG tablet Take 25 mg by mouth daily as needed for dizziness.   Yes [provider]  olmesartan (BENICAR) 40 MG tablet Take 1 tablet (40 mg total) by mouth daily. 07/02/21  Yes   omeprazole (PRILOSEC) 40 MG capsule Take 1 capsule (40 mg total) by mouth daily. 06/26/21  Yes Annitta Needs, NP  Probiotic Product (PROBIOTIC PO) Take by mouth daily.   Yes [provider]  tamoxifen (NOLVADEX) 20 MG tablet Take 1 tablet (20 mg total) by mouth daily. 12/17/21  Yes Derek Jack, MD      Allergies    Bee venom    Review of Systems   Review of Systems  Gastrointestinal:  Positive for nausea and vomiting.  Neurological:  Positive for dizziness.  All other systems reviewed and are negative.   Physical Exam Updated Vital Signs BP 127/86   Pulse 86   Temp (!) 97.4 F (36.3 C) (Oral)   Resp 16   Ht '5\' 7"'$  (1.702 m)   Wt 86.2 kg   LMP 09/22/2018 (Approximate) Comment:  on tamoxifen no period over a year bleedi 11/14 like a period   SpO2 100%   BMI 29.76 kg/m  Physical Exam Vitals and nursing note reviewed.  Constitutional:  General: She is not in acute distress.    Appearance: She is well-developed and normal weight. She is not toxic-appearing or diaphoretic.  HENT:     Head: Normocephalic and atraumatic.     Right Ear: Tympanic membrane, ear canal and external ear normal.     Left Ear: Tympanic membrane, ear canal and external ear normal.     Nose: Nose normal.     Mouth/Throat:     Mouth: Mucous membranes are moist.     Pharynx: Oropharynx is clear.  Eyes:     General: No scleral icterus.    Extraocular Movements: Extraocular movements intact.     Conjunctiva/sclera: Conjunctivae normal.  Cardiovascular:     Rate and Rhythm: Normal rate and regular rhythm.  Pulmonary:     Effort: Pulmonary effort is normal.  No respiratory distress.  Abdominal:     General: There is no distension.     Palpations: Abdomen is soft.     Tenderness: There is no abdominal tenderness.  Musculoskeletal:        General: No swelling. Normal range of motion.     Cervical back: Normal range of motion and neck supple.     Right lower leg: No edema.     Left lower leg: No edema.  Skin:    General: Skin is warm and dry.     Capillary Refill: Capillary refill takes less than 2 seconds.     Coloration: Skin is not jaundiced or pale.  Neurological:     General: No focal deficit present.     Mental Status: She is alert and oriented to person, place, and time.     Cranial Nerves: No cranial nerve deficit.     Sensory: No sensory deficit.     Motor: No weakness.     Coordination: Coordination normal.  Psychiatric:        Mood and Affect: Mood normal.        Behavior: Behavior normal.        Thought Content: Thought content normal.        Judgment: Judgment normal.     ED Results / Procedures / Treatments   Labs (all labs ordered are listed, but only abnormal results are displayed) Labs Reviewed  COMPREHENSIVE METABOLIC PANEL - Abnormal; Notable for the following components:      Result Value   Potassium 2.6 (*)    Glucose, Bld 135 (*)    Total Protein 6.4 (*)    All other components within normal limits  CBC WITH DIFFERENTIAL/PLATELET - Abnormal; Notable for the following components:   Hemoglobin 11.6 (*)    HCT 35.9 (*)    All other components within normal limits  LIPASE, BLOOD  MAGNESIUM  URINALYSIS, ROUTINE W REFLEX MICROSCOPIC  POC URINE PREG, ED  TROPONIN I (HIGH SENSITIVITY)  TROPONIN I (HIGH SENSITIVITY)    EKG EKG Interpretation  Date/Time:  Wednesday December 19 2021 23:47:47 EDT Ventricular Rate:  86 PR Interval:  210 QRS Duration: 99 QT Interval:  389 QTC Calculation: 466 R Axis:   71 Text Interpretation: Sinus rhythm Prolonged PR interval Confirmed by Merrily Pew [phone]) on  12/20/2021 12:21:14 AM  Radiology CT Head Wo Contrast  Result Date: 12/19/2021 CLINICAL DATA:  Persistent or recurrent dizziness with cardiac or vascular cause suspected. Sudden onset dizziness and nausea. EXAM: CT HEAD WITHOUT CONTRAST TECHNIQUE: Contiguous axial images were obtained from the base of the skull through the vertex without intravenous contrast. RADIATION DOSE REDUCTION: This exam  was performed according to the departmental dose-optimization program which includes automated exposure control, adjustment of the mA and/or kV according to patient size and/or use of iterative reconstruction technique. COMPARISON:  None Available. FINDINGS: Brain: No evidence of acute infarction, hemorrhage, hydrocephalus, extra-axial collection or mass lesion/mass effect. Vascular: No hyperdense vessel or unexpected calcification. Skull: Normal. Negative for fracture or focal lesion. Sinuses/Orbits: Paranasal sinuses are clear. Partial mastoid effusion on the left. Other: None. IMPRESSION: 1. No acute intracranial abnormalities. 2. Partial left mastoid effusion. Electronically Signed   By: Lucienne Capers M.D.   On: 12/19/2021 23:42   DG Abd Acute W/Chest  Result Date: 12/19/2021 CLINICAL DATA:  Dizziness/vertigo, nausea/vomiting, history of breast cancer EXAM: DG ABDOMEN ACUTE WITH 1 VIEW CHEST COMPARISON:  Chest radiographs dated 06/29/2019 FINDINGS: Lungs are clear.  No pleural effusion or pneumothorax. The heart is normal in size. Nonobstructive bowel gas pattern. Mild degenerative changes of the lumbar spine. IMPRESSION: Negative abdominal radiographs.  No acute cardiopulmonary disease. Electronically Signed   By: Julian Hy M.D.   On: 12/19/2021 20:00    Procedures Procedures    Medications Ordered in ED Medications  potassium chloride 10 mEq in 100 mL IVPB (10 mEq Intravenous New Bag/Given 12/20/21 0016)  droperidol (INAPSINE) 2.5 MG/ML injection 2.5 mg (has no administration in time  range)  ondansetron (ZOFRAN) injection 4 mg (4 mg Intravenous Given 12/19/21 2004)  lactated ringers bolus 1,000 mL (0 mLs Intravenous Stopped 12/19/21 2322)  diazepam (VALIUM) injection 5 mg (5 mg Intravenous Given 12/19/21 2026)  meclizine (ANTIVERT) tablet 25 mg (25 mg Oral Given 12/19/21 2128)  metoCLOPramide (REGLAN) injection 5 mg (5 mg Intravenous Given 12/19/21 2152)  potassium chloride SA (KLOR-CON M) CR tablet 40 mEq (40 mEq Oral Given 12/19/21 2302)    ED Course/ Medical Decision Making/ A&P                           Medical Decision Making Amount and/or Complexity of Data Reviewed Labs: ordered. Radiology: ordered. ECG/medicine tests: ordered.  Risk Prescription drug management.   This patient presents to the ED for concern of dizziness, nausea, and vomiting, this involves an extensive number of treatment options, and is a complaint that carries with it a high risk of complications and morbidity.  The differential diagnosis includes labyrinthitis, vestibular neuritis, BPPV, Mnire's disease, cerebellar stroke   Co morbidities that complicate the patient evaluation  GERD, breast cancer in remission, HLD, nephrolithiasis   Additional history obtained:  Additional history obtained from patient's friend External records from outside source obtained and reviewed including EMR   Lab Tests:  I Ordered, and personally interpreted labs.  The pertinent results include: Hypokalemia is present.  Electrolytes are otherwise normal.  Anemia is baseline.  Troponin is normal.  There is no leukocytosis.   Imaging Studies ordered:  I ordered imaging studies including chest and abdominal x-ray, CT head I independently visualized and interpreted imaging which showed no acute findings on x-ray.  CT of head showed partial left mastoid effusion. I agree with the radiologist interpretation   Cardiac Monitoring: / EKG:  The patient was maintained on a cardiac monitor.  I  personally viewed and interpreted the cardiac monitored which showed an underlying rhythm of: Sinus rhythm  Problem List / ED Course / Critical interventions / Medication management  Patient is a 51 year old female presenting for cute onset of vertigo and nausea.  This occurred shortly prior to arrival.  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  She was seated at a desk at the time.  She has a history of similar episodes.  She most recently had an episode on Sunday that lasted for most of the day but eventually resolved.  She takes meclizine at home.  When her symptoms began today, she did take a dose of meclizine but subsequently vomited.  She does not feel that she absorbed any of this medication.  She has had persistent vomiting and is vomiting on arrival in the ED.  IV fluids and Zofran were ordered.  On exam, patient is alert and oriented.  She does not have focal neurologic deficits.  She endorses a left ear fullness.  She denies any tinnitus.  Inspection of TMs are unremarkable bilaterally.  Her symptoms worsen with positional changes, suggestive of peripheral etiology.  Additionally, patient's description of symptoms as similar to prior episodes lower suspicion for central etiology.  Patient endorses continued vertigo.  IV Valium was ordered.  Laboratory work-up was initiated.  On reassessment, patient reports improved symptoms.  She has had resolution of her vomiting.  She continues to endorse mild dizziness and nausea which is continued to worsen with positional changes.  Meclizine was ordered.  Patient was offered Epley maneuver but declined stating that her symptoms were still too severe to attempt.  On further reassessment, patient had continued nausea and Reglan was ordered.  She continued to decline Epley maneuver.  Laboratory work-up is notable for hypokalemia with a potassium of 2.6.  Replacement potassium was ordered.  Due to persistent symptoms, CT of  head was ordered.  This was pending at time of signout.  If patient's symptoms resolve, she would be appropriate for discharge with continued potassium replacement at home.  While she is here in the ED, we will continue to replace potassium.  Care of patient was signed out to oncoming ED provider. I ordered medication including Zofran, Valium, Reglan, meclizine for dizziness and nausea; IV fluids for hydration Reevaluation of the patient after these medicines showed that the patient improved I have reviewed the patients home medicines and have made adjustments as needed   Social Determinants of Health:  Lives independently, has access to outpatient care         Final Clinical Impression(s) / ED Diagnoses Final diagnoses:  Vertigo    Rx / DC Orders ED Discharge Orders     None         Godfrey Pick, MD 12/20/21 0036

## 2021-12-19 NOTE — ED Notes (Signed)
Patient transported to CT 

## 2021-12-19 NOTE — ED Triage Notes (Addendum)
Pt c/o sudden onset dizziness and nausea. Pt reports Dr. Jamey Reas placed her on iron pills for iron deficiency anemia in hopes to avoid iron infusion.  Took meclizine without relief.  She actively vomiting in triage. She is in remission from Breast CA x 3 years. Joan Flores, RN

## 2021-12-20 ENCOUNTER — Telehealth (HOSPITAL_COMMUNITY): Payer: Self-pay | Admitting: Emergency Medicine

## 2021-12-20 DIAGNOSIS — R42 Dizziness and giddiness: Secondary | ICD-10-CM | POA: Diagnosis not present

## 2021-12-20 DIAGNOSIS — R112 Nausea with vomiting, unspecified: Secondary | ICD-10-CM | POA: Diagnosis not present

## 2021-12-20 DIAGNOSIS — Z853 Personal history of malignant neoplasm of breast: Secondary | ICD-10-CM | POA: Diagnosis not present

## 2021-12-20 LAB — URINALYSIS, ROUTINE W REFLEX MICROSCOPIC
Bilirubin Urine: NEGATIVE
Glucose, UA: NEGATIVE mg/dL
Hgb urine dipstick: NEGATIVE
Ketones, ur: 20 mg/dL — AB
Leukocytes,Ua: NEGATIVE
Nitrite: NEGATIVE
Protein, ur: NEGATIVE mg/dL
Specific Gravity, Urine: 1.018 (ref 1.005–1.030)
pH: 7 (ref 5.0–8.0)

## 2021-12-20 LAB — POC URINE PREG, ED: Preg Test, Ur: NEGATIVE

## 2021-12-20 MED ORDER — POTASSIUM CHLORIDE CRYS ER 20 MEQ PO TBCR: 40.0000 meq | EXTENDED_RELEASE_TABLET | Freq: Two times a day (BID) | ORAL | 0 refills | Status: DC

## 2021-12-20 MED ORDER — DIAZEPAM 5 MG PO TABS: 5.0000 mg | ORAL_TABLET | Freq: Four times a day (QID) | ORAL | 0 refills | Status: DC | PRN

## 2021-12-20 MED ORDER — DROPERIDOL 2.5 MG/ML IJ SOLN
2.5000 mg | Freq: Once | INTRAMUSCULAR | Status: AC
  Administered 2021-12-20: 2.5 mg via INTRAVENOUS
  Filled 2021-12-20: qty 2

## 2021-12-20 MED ORDER — MECLIZINE HCL 25 MG PO TABS: 25.0000 mg | ORAL_TABLET | Freq: Three times a day (TID) | ORAL | 0 refills | Status: AC | PRN

## 2021-12-20 NOTE — ED Provider Notes (Signed)
1:36 AM Assumed care from Dr. Doren Custard, please see their note for full history, physical and decision making until this point. In brief this is a 51 y.o. year old female who presented to the ED tonight with Dizziness and Nausea     51 year old female with history of vertigo here with a severe exacerbation of the same.  History of multiple medications that are starting to help but needs reevaluation.  Her potassium is also low so she is getting rounds of potassium.  Told by nursing that patient still dizzy and nauseous with droperidol ordered.  She still getting her potassium.  On reevaluation patient states her nausea is gone and her dizziness is pretty much all the way gone as well.  She states it feels like previous vertigo just a lot worse.  She is done multiple things for vertigo in the past and was not really keen on trying those things again.  She just wants medications in case it comes back.  She does not want her fifth round of potassium so she will take some potassium for about a week at home and have it followed up by her doctor.  I suggested following up with ENT however she seems hesitant.  Neuro intact. Patient stable for discharge.  Discharge instructions, including strict return precautions for new or worsening symptoms, given. Patient and/or family verbalized understanding and agreement with the plan as described.   Labs, studies and imaging reviewed by myself and considered in medical decision making if ordered. Imaging interpreted by radiology.  Labs Reviewed  COMPREHENSIVE METABOLIC PANEL - Abnormal; Notable for the following components:      Result Value   Potassium 2.6 (*)    Glucose, Bld 135 (*)    Total Protein 6.4 (*)    All other components within normal limits  CBC WITH DIFFERENTIAL/PLATELET - Abnormal; Notable for the following components:   Hemoglobin 11.6 (*)    HCT 35.9 (*)    All other components within normal limits  URINALYSIS, ROUTINE W REFLEX MICROSCOPIC -  Abnormal; Notable for the following components:   Ketones, ur 20 (*)    All other components within normal limits  LIPASE, BLOOD  MAGNESIUM  POC URINE PREG, ED  TROPONIN I (HIGH SENSITIVITY)  TROPONIN I (HIGH SENSITIVITY)    CT Head Wo Contrast  Final Result    DG Abd Acute W/Chest  Final Result      No follow-ups on file.    Haddon Fyfe, Corene Cornea, MD 12/20/21 [phone]

## 2021-12-20 NOTE — Progress Notes (Signed)
OV made °

## 2021-12-20 NOTE — Telephone Encounter (Signed)
Patient's potassium was not electronically prescribed, I have sent this to her pharmacy, [facility] on [address].

## 2021-12-31 ENCOUNTER — Other Ambulatory Visit (HOSPITAL_COMMUNITY): Payer: Self-pay

## 2021-12-31 DIAGNOSIS — H8113 Benign paroxysmal vertigo, bilateral: Secondary | ICD-10-CM | POA: Diagnosis not present

## 2021-12-31 DIAGNOSIS — E876 Hypokalemia: Secondary | ICD-10-CM | POA: Diagnosis not present

## 2021-12-31 DIAGNOSIS — D509 Iron deficiency anemia, unspecified: Secondary | ICD-10-CM | POA: Diagnosis not present

## 2021-12-31 MED ORDER — DIAZEPAM 5 MG PO TABS
5.0000 mg | ORAL_TABLET | Freq: Every evening | ORAL | 5 refills | Status: AC | PRN
  Filled 2021-12-31: qty 30, 30d supply, fill #0

## 2021-12-31 MED ORDER — POTASSIUM CHLORIDE ER 10 MEQ PO TBCR
10.0000 meq | EXTENDED_RELEASE_TABLET | Freq: Every day | ORAL | 5 refills | Status: DC
  Filled 2021-12-31: qty 30, 30d supply, fill #0
  Filled 2022-01-22: qty 30, 30d supply, fill #1

## 2022-01-03 ENCOUNTER — Other Ambulatory Visit (HOSPITAL_COMMUNITY): Payer: Self-pay

## 2022-01-03 MED ORDER — OLMESARTAN MEDOXOMIL 40 MG PO TABS
40.0000 mg | ORAL_TABLET | Freq: Every day | ORAL | 1 refills | Status: DC
  Filled 2022-01-03: qty 90, 90d supply, fill #0
  Filled 2022-03-18: qty 90, 90d supply, fill #1

## 2022-01-09 ENCOUNTER — Encounter: Payer: Self-pay | Admitting: Gastroenterology

## 2022-01-09 ENCOUNTER — Ambulatory Visit (INDEPENDENT_AMBULATORY_CARE_PROVIDER_SITE_OTHER): Payer: 59 | Admitting: Gastroenterology

## 2022-01-09 VITALS — BP 127/84 | HR 77 | Temp 98.0°F | Ht 67.0 in | Wt 195.0 lb

## 2022-01-09 DIAGNOSIS — Z800 Family history of malignant neoplasm of digestive organs: Secondary | ICD-10-CM | POA: Diagnosis not present

## 2022-01-09 DIAGNOSIS — D126 Benign neoplasm of colon, unspecified: Secondary | ICD-10-CM | POA: Insufficient documentation

## 2022-01-09 DIAGNOSIS — D509 Iron deficiency anemia, unspecified: Secondary | ICD-10-CM

## 2022-01-09 NOTE — Patient Instructions (Signed)
We are arranging a colonoscopy and upper endoscopy with Dr. Gala Romney in the near future!  Please stop iron 7 days before the procedure.   We will see you after the procedures!  I enjoyed seeing you again today! As you know, I value our relationship and want to provide genuine, compassionate, and quality care. I welcome your feedback. If you receive a survey regarding your visit,  I greatly appreciate you taking time to fill this out. See you next time!  Annitta Needs, PhD, ANP-BC [facility]

## 2022-01-09 NOTE — Progress Notes (Signed)
Gastroenterology Office Note     Primary Care Physician:  Pablo Lawrence, NP  Primary Gastroenterologist: Dr. Gala Romney    Chief Complaint   Chief Complaint  Patient presents with   Follow-up    Patient here today for a follow up on her Jerrye Bushy. She is taking omeprazole 40 mg once per day and gerd is well controlled on this. She is also in need of a Tcs.     History of Present Illness   Lorraine Peters is a 51 y.o. female presenting today in follow-up with a history of GERD, multiple adenomas with surveillance due in 2023, abdominal pain s/p EGD in July 2019. RUQ [place] July 2019 with fatty liver, no gallstones.   She was to be triaged for colonoscopy; however, she was recently found to have profoundly low ferritin in Oct 2023 at 4. Hgb 11.8 in March 2023, 11.6 in Oct 2023. Has felt tired. No menstrual cycles. No overt GI bleeding. Stool is dark on iron. Started iron on Oct 9th. No abdominal pain. No dysphagia. No changes in bowel habits. Miralax daily. She feels improved with fatigue since taking iron. GERD controlled on PPI daily.     Past Medical History:  Diagnosis Date   Breast cancer ([place])    Breast disorder    left breast at age 76   Cigarette nicotine dependence, uncomplicated 67/61/9509   Family history of breast cancer    Family history of colon cancer    H. pylori infection    History of kidney stones    Hypercholesterolemia    Hypertension    Migraine    Ovarian cyst    Personal history of radiation therapy    PONV (postoperative nausea and vomiting)    Reflux    right 05/23/2020   right    Past Surgical History:  Procedure Laterality Date   BREAST BIOPSY Left 04/2019   BREAST BIOPSY Right 05/23/2020   BREAST LUMPECTOMY Left 2020   COLONOSCOPY N/A 12/30/2018   six 4-7 mm polyps in rectum, descending colon, and ascending colon. Sigmoid and descending colon diverticulosis. Serrated and tubular adenoma. Surveillance 3 years.    ESOPHAGOGASTRODUODENOSCOPY N/A  09/12/2017   normal esophagus, small hiatal hernia, large pedunculated duodenal polyp s/p resection and clip placement. Benign polyp   PARTIAL MASTECTOMY WITH NEEDLE LOCALIZATION AND AXILLARY SENTINEL LYMPH NODE BX Left 04/29/2018   Procedure: PARTIAL MASTECTOMY WITH NEEDLE LOCALIZATION AND AXILLARY SENTINEL LYMPH NODE BX;  Surgeon: Virl Cagey, MD;  Location: [facility];  Service: General;  Laterality: Left;   POLYPECTOMY  12/30/2018   Procedure: POLYPECTOMY;  Surgeon: Daneil Dolin, MD;  Location: [facility];  Service: Endoscopy;;  ascending,descending,rectal   TUBAL LIGATION      Current Outpatient Medications  Medication Sig Dispense Refill   ALPRAZolam (XANAX) 0.25 MG tablet Take 1 tablet (0.25 mg total) by mouth at bedtime as needed for anxiety. 30 tablet 2   atorvastatin (LIPITOR) 10 MG tablet Take 1 tablet (10 mg total) by mouth daily. 90 tablet 0   CALCIUM PO Take 2,400 mg by mouth daily.     diazepam (VALIUM) 5 MG tablet Take 1 tablet (5 mg total) by mouth at bedtime as needed. Max daily amount is 1 tablet. 30 tablet 5   ferrous sulfate 325 (65 FE) MG EC tablet Take 325 mg by mouth daily with breakfast.     hydrochlorothiazide (HYDRODIURIL) 25 MG tablet Take 1 tablet (25 mg total) by mouth every morning. (  Patient taking differently: Take 25 mg by mouth every morning. 12.5 mg daily.) 90 tablet 2   levocetirizine (XYZAL) 5 MG tablet Take 5 mg by mouth every evening.     meclizine (ANTIVERT) 25 MG tablet Take 1 tablet (25 mg total) by mouth 3 (three) times daily as needed for dizziness. 30 tablet 0   olmesartan (BENICAR) 40 MG tablet Take 1 tablet (40 mg total) by mouth daily. 90 tablet 1   omeprazole (PRILOSEC) 40 MG capsule Take 1 capsule (40 mg total) by mouth daily. 90 capsule 3   polyethylene glycol (MIRALAX / GLYCOLAX) 17 g packet Take 17 g by mouth daily.     potassium chloride (KLOR-CON) 10 MEQ tablet Take 1 tablet (10 mEq total) by mouth daily. 30 tablet 5   Probiotic  Product (PROBIOTIC PO) Take by mouth daily.     tamoxifen (NOLVADEX) 20 MG tablet Take 1 tablet (20 mg total) by mouth daily. 30 tablet 11   No current facility-administered medications for this visit.    Allergies as of 01/09/2022 - Review Complete 01/09/2022  Allergen Reaction Noted   Bee venom Swelling and Other (See Comments) 04/29/2011    Family History  Problem Relation Age of Onset   Arthritis Mother    Cancer Mother        breast   Breast cancer Mother 30       recurrance at 46   COPD Father    Heart disease Father    Hyperlipidemia Father    Hypertension Father    Cancer Sister 25       anal, deceased due to mva but had advanced disease   Stroke Maternal Grandfather    Breast cancer Maternal Aunt 58    Social History   Socioeconomic History   Marital status: Divorced    Spouse name: Not on file   Number of children: 1   Years of education: Not on file   Highest education level: Not on file  Occupational History   Not on file  Tobacco Use   Smoking status: Former    Packs/day: 0.50    Years: 28.00    Total pack years: 14.00    Types: Cigarettes    Quit date: 04/22/2018    Years since quitting: 3.7   Smokeless tobacco: Never  Vaping Use   Vaping Use: Never used  Substance and Sexual Activity   Alcohol use: No    Alcohol/week: 0.0 standard drinks of alcohol   Drug use: No   Sexual activity: Not Currently    Birth control/protection: Surgical    Comment: tubal  Other Topics Concern   Not on file  Social History Narrative   Not on file   Social Determinants of Health   Financial Resource Strain: Low Risk  (11/19/2021)   Overall Financial Resource Strain (CARDIA)    Difficulty of Paying Living Expenses: Not very hard  Food Insecurity: No Food Insecurity (11/19/2021)   Hunger Vital Sign    Worried About Running Out of Food in the Last Year: Never true    Ran Out of Food in the Last Year: Never true  Transportation Needs: No Transportation Needs  (11/19/2021)   PRAPARE - Hydrologist (Medical): No    Lack of Transportation (Non-Medical): No  Physical Activity: Insufficiently Active (11/19/2021)   Exercise Vital Sign    Days of Exercise per Week: 2 days    Minutes of Exercise per Session: 30 min  Stress:  No Stress Concern Present (11/19/2021)   [place]    Feeling of Stress : Only a little  Social Connections: Moderately Integrated (11/19/2021)   Social Connection and Isolation Panel [NHANES]    Frequency of Communication with Friends and Family: More than three times a week    Frequency of Social Gatherings with Friends and Family: Once a week    Attends Religious Services: More than 4 times per year    Active Member of [employer] or Organizations: Yes    Attends Music therapist: More than 4 times per year    Marital Status: Divorced  Intimate Partner Violence: Not At Risk (11/19/2021)   Humiliation, Afraid, Rape, and Kick questionnaire    Fear of Current or Ex-Partner: No    Emotionally Abused: No    Physically Abused: No    Sexually Abused: No     Review of Systems   Gen: Denies any fever, chills, fatigue, weight loss, lack of appetite.  CV: Denies chest pain, heart palpitations, peripheral edema, syncope.  Resp: Denies shortness of breath at rest or with exertion. Denies wheezing or cough.  GI: see HPI GU : Denies urinary burning, urinary frequency, urinary hesitancy MS: Denies joint pain, muscle weakness, cramps, or limitation of movement.  Derm: Denies rash, itching, dry skin Psych: Denies depression, anxiety, memory loss, and confusion Heme: Denies bruising, bleeding, and enlarged lymph nodes.   Physical Exam   BP 127/84 (BP Location: Right Arm, Patient Position: Sitting, Cuff Size: Large)   Pulse 77   Temp 98 F (36.7 C) (Temporal)   Ht '5\' 7"'$  (1.702 m)   Wt 195 lb (88.5 kg)   LMP 09/22/2018 (Approximate)  Comment:  on tamoxifen no period over a year bleedi 11/14 like a period   BMI 30.54 kg/m  General:   Alert and oriented. Pleasant and cooperative. Well-nourished and well-developed.  Head:  Normocephalic and atraumatic. Eyes:  Without icterus Cardiac: S1 S2 present without murmurs Lungs: clear bilaterally Abdomen:  +BS, soft, non-tender and non-distended. No HSM noted. No guarding or rebound. No masses appreciated.  Rectal:  Deferred  Msk:  Symmetrical without gross deformities. Normal posture. Extremities:  Without edema. Neurologic:  Alert and  oriented x4;  grossly normal neurologically. Skin:  Intact without significant lesions or rashes. Psych:  Alert and cooperative. Normal mood and affect.   Assessment   Lorraine Peters is a 51 y.o. female presenting today in follow-up with a history of 51 y.o. female presenting today in follow-up with a history of GERD, multiple adenomas with surveillance due in 2023, now found to have IDA on routine labs.   IDA: profoundly low ferritin in Oct 2023 at 4. Hgb 11.8 in March 2023, 11.6 in Oct 2023. Hgb has been in the high 11 range dating back to 2022. No overt GI bleeding. Only symptom is fatigue, which she states has improved since starting iron once daily.   GERD: well-controlled on PPI daily. No alarm signs/symptoms.  History of multiple adenomas:due for 3 year surveillance now.   As she is due for colonoscopy anyway, will add EGD due to recent finding of IDA. Last EGD in 2019. Notably, she has family history of rectal cancer in sister   PLAN    Proceed with colonoscopy/IDA by Dr. Gala Romney in near future: the risks, benefits, and alternatives have been discussed with the patient in detail. The patient states understanding and desires to proceed. Hold iron 7 days  prior Continue PPI Return in follow-up thereafter   Annitta Needs, PhD, ANP-BC [facility]

## 2022-01-15 ENCOUNTER — Other Ambulatory Visit (HOSPITAL_COMMUNITY): Payer: Self-pay

## 2022-01-15 ENCOUNTER — Telehealth (INDEPENDENT_AMBULATORY_CARE_PROVIDER_SITE_OTHER): Payer: Self-pay | Admitting: *Deleted

## 2022-01-15 ENCOUNTER — Encounter (INDEPENDENT_AMBULATORY_CARE_PROVIDER_SITE_OTHER): Payer: Self-pay | Admitting: *Deleted

## 2022-01-15 DIAGNOSIS — D509 Iron deficiency anemia, unspecified: Secondary | ICD-10-CM

## 2022-01-15 DIAGNOSIS — Z86010 Personal history of colonic polyps: Secondary | ICD-10-CM

## 2022-01-15 DIAGNOSIS — Z800 Family history of malignant neoplasm of digestive organs: Secondary | ICD-10-CM

## 2022-01-15 MED ORDER — CLENPIQ 10-3.5-12 MG-GM -GM/175ML PO SOLN
1.0000 | ORAL | 0 refills | Status: DC
  Filled 2022-01-15: qty 350, 1d supply, fill #0

## 2022-01-15 NOTE — Telephone Encounter (Signed)
Called pt. Scheduled for TCS/EGD with Dr. Gala Romney, asa 2. Aware will need to hold iron 7 days prior and will need lab work prior. Will send instructions via mychart.

## 2022-01-22 ENCOUNTER — Other Ambulatory Visit (HOSPITAL_COMMUNITY): Payer: Self-pay

## 2022-01-22 MED ORDER — ATORVASTATIN CALCIUM 10 MG PO TABS
10.0000 mg | ORAL_TABLET | Freq: Every day | ORAL | 0 refills | Status: DC
  Filled 2022-01-22: qty 90, 90d supply, fill #0

## 2022-01-23 ENCOUNTER — Other Ambulatory Visit (HOSPITAL_COMMUNITY): Payer: Self-pay

## 2022-01-23 MED ORDER — POTASSIUM CHLORIDE ER 10 MEQ PO TBCR
10.0000 meq | EXTENDED_RELEASE_TABLET | Freq: Every day | ORAL | 5 refills | Status: DC
  Filled 2022-01-23 – 2022-05-22 (×3): qty 30, 30d supply, fill #0
  Filled 2022-08-12: qty 30, 30d supply, fill #1
  Filled 2022-09-18: qty 30, 30d supply, fill #0
  Filled 2022-10-16: qty 30, 30d supply, fill #1

## 2022-01-24 DIAGNOSIS — D0512 Intraductal carcinoma in situ of left breast: Secondary | ICD-10-CM | POA: Diagnosis not present

## 2022-02-04 ENCOUNTER — Other Ambulatory Visit (HOSPITAL_COMMUNITY)
Admission: RE | Admit: 2022-02-04 | Discharge: 2022-02-04 | Disposition: A | Discharge status: Home / Self Care | Payer: 59 | Source: Ambulatory Visit | Attending: Internal Medicine | Admitting: Internal Medicine

## 2022-02-04 ENCOUNTER — Inpatient Hospital Stay: Payer: 59 | Attending: Hematology

## 2022-02-04 DIAGNOSIS — D508 Other iron deficiency anemias: Secondary | ICD-10-CM

## 2022-02-04 DIAGNOSIS — D509 Iron deficiency anemia, unspecified: Secondary | ICD-10-CM | POA: Insufficient documentation

## 2022-02-04 DIAGNOSIS — Z86010 Personal history of colonic polyps: Secondary | ICD-10-CM

## 2022-02-04 DIAGNOSIS — Z800 Family history of malignant neoplasm of digestive organs: Secondary | ICD-10-CM

## 2022-02-04 DIAGNOSIS — Z79810 Long term (current) use of selective estrogen receptor modulators (SERMs): Secondary | ICD-10-CM | POA: Insufficient documentation

## 2022-02-04 DIAGNOSIS — D0512 Intraductal carcinoma in situ of left breast: Secondary | ICD-10-CM | POA: Diagnosis not present

## 2022-02-04 LAB — CBC
HCT: 41.4 % (ref 36.0–46.0)
Hemoglobin: 13.4 g/dL (ref 12.0–15.0)
MCH: 28 pg (ref 26.0–34.0)
MCHC: 32.4 g/dL (ref 30.0–36.0)
MCV: 86.4 fL (ref 80.0–100.0)
Platelets: 273 10*3/uL (ref 150–400)
RBC: 4.79 MIL/uL (ref 3.87–5.11)
RDW: 14.7 % (ref 11.5–15.5)
WBC: 6.4 10*3/uL (ref 4.0–10.5)
nRBC: 0 % (ref 0.0–0.2)

## 2022-02-04 LAB — IRON AND TIBC
Iron: 62 ug/dL (ref 28–170)
Saturation Ratios: 17 % (ref 10.4–31.8)
TIBC: 359 ug/dL (ref 250–450)
UIBC: 297 ug/dL

## 2022-02-04 LAB — BASIC METABOLIC PANEL
Anion gap: 7 (ref 5–15)
BUN: 15 mg/dL (ref 6–20)
CO2: 26 mmol/L (ref 22–32)
Calcium: 9 mg/dL (ref 8.9–10.3)
Chloride: 108 mmol/L (ref 98–111)
Creatinine, Ser: 0.51 mg/dL (ref 0.44–1.00)
GFR, Estimated: >60 mL/min (ref >60)
Glucose, Bld: 112 mg/dL — ABNORMAL HIGH (ref 70–99)
Potassium: 4 mmol/L (ref 3.5–5.1)
Sodium: 141 mmol/L (ref 135–145)

## 2022-02-04 LAB — FERRITIN: Ferritin: 14 ng/mL (ref 11–307)

## 2022-02-06 ENCOUNTER — Telehealth: Payer: Self-pay | Admitting: *Deleted

## 2022-02-06 NOTE — Telephone Encounter (Signed)
Called pt and aware new arrival time for 12/4 is 9:15am. She voiced understanding

## 2022-02-11 ENCOUNTER — Ambulatory Visit (HOSPITAL_COMMUNITY): Payer: 59 | Admitting: Anesthesiology

## 2022-02-11 ENCOUNTER — Other Ambulatory Visit: Payer: Self-pay

## 2022-02-11 ENCOUNTER — Encounter (HOSPITAL_COMMUNITY): Payer: Self-pay | Admitting: Internal Medicine

## 2022-02-11 ENCOUNTER — Ambulatory Visit (HOSPITAL_BASED_OUTPATIENT_CLINIC_OR_DEPARTMENT_OTHER): Payer: 59 | Admitting: Anesthesiology

## 2022-02-11 ENCOUNTER — Encounter (HOSPITAL_COMMUNITY)
Admission: RE | Disposition: A | Discharge status: Home / Self Care | Payer: Self-pay | Source: Home / Self Care | Attending: Internal Medicine

## 2022-02-11 ENCOUNTER — Ambulatory Visit (HOSPITAL_COMMUNITY)
Admission: RE | Admit: 2022-02-11 | Discharge: 2022-02-11 | Disposition: A | Discharge status: Home / Self Care | Payer: 59 | Attending: Internal Medicine | Admitting: Internal Medicine

## 2022-02-11 DIAGNOSIS — D126 Benign neoplasm of colon, unspecified: Secondary | ICD-10-CM

## 2022-02-11 DIAGNOSIS — Z86010 Personal history of colonic polyps: Secondary | ICD-10-CM | POA: Diagnosis not present

## 2022-02-11 DIAGNOSIS — K449 Diaphragmatic hernia without obstruction or gangrene: Secondary | ICD-10-CM | POA: Insufficient documentation

## 2022-02-11 DIAGNOSIS — D124 Benign neoplasm of descending colon: Secondary | ICD-10-CM

## 2022-02-11 DIAGNOSIS — Z800 Family history of malignant neoplasm of digestive organs: Secondary | ICD-10-CM | POA: Insufficient documentation

## 2022-02-11 DIAGNOSIS — I10 Essential (primary) hypertension: Secondary | ICD-10-CM | POA: Diagnosis not present

## 2022-02-11 DIAGNOSIS — K219 Gastro-esophageal reflux disease without esophagitis: Secondary | ICD-10-CM | POA: Diagnosis not present

## 2022-02-11 DIAGNOSIS — Z853 Personal history of malignant neoplasm of breast: Secondary | ICD-10-CM | POA: Insufficient documentation

## 2022-02-11 DIAGNOSIS — Z09 Encounter for follow-up examination after completed treatment for conditions other than malignant neoplasm: Secondary | ICD-10-CM | POA: Diagnosis not present

## 2022-02-11 DIAGNOSIS — D509 Iron deficiency anemia, unspecified: Secondary | ICD-10-CM | POA: Insufficient documentation

## 2022-02-11 DIAGNOSIS — Z87891 Personal history of nicotine dependence: Secondary | ICD-10-CM | POA: Insufficient documentation

## 2022-02-11 DIAGNOSIS — K635 Polyp of colon: Secondary | ICD-10-CM | POA: Diagnosis not present

## 2022-02-11 DIAGNOSIS — Z79899 Other long term (current) drug therapy: Secondary | ICD-10-CM | POA: Diagnosis not present

## 2022-02-11 HISTORY — PX: POLYPECTOMY: SHX5525

## 2022-02-11 HISTORY — PX: ESOPHAGOGASTRODUODENOSCOPY (EGD) WITH PROPOFOL: SHX5813

## 2022-02-11 HISTORY — PX: COLONOSCOPY WITH PROPOFOL: SHX5780

## 2022-02-11 SURGERY — COLONOSCOPY WITH PROPOFOL
Anesthesia: General

## 2022-02-11 MED ORDER — PROPOFOL 10 MG/ML IV BOLUS
INTRAVENOUS | Status: DC | PRN
  Administered 2022-02-11 (×5): 50 mg via INTRAVENOUS

## 2022-02-11 MED ORDER — STERILE WATER FOR IRRIGATION IR SOLN
Status: DC | PRN
  Administered 2022-02-11: .6 mL

## 2022-02-11 MED ORDER — LACTATED RINGERS IV SOLN
INTRAVENOUS | Status: DC
  Administered 2022-02-11: 1000 mL via INTRAVENOUS

## 2022-02-11 NOTE — Anesthesia Postprocedure Evaluation (Signed)
Anesthesia Post Note  Patient: Lorraine Peters  Procedure(s) Performed: COLONOSCOPY WITH PROPOFOL ESOPHAGOGASTRODUODENOSCOPY (EGD) WITH PROPOFOL POLYPECTOMY  Patient location during evaluation: Phase II Anesthesia Type: General Level of consciousness: awake and alert and oriented Pain management: pain level controlled Vital Signs Assessment: post-procedure vital signs reviewed and stable Respiratory status: spontaneous breathing, nonlabored ventilation and respiratory function stable Cardiovascular status: blood pressure returned to baseline and stable Postop Assessment: no apparent nausea or vomiting Anesthetic complications: no  No notable events documented.   Last Vitals:  Vitals:   02/11/22 1142 02/11/22 1227  BP: (!) 160/57 (!) 117/58  Pulse:  62  Resp: 16   Temp: 37.2 C 37.4 C  SpO2: 98% 99%    Last Pain:  Vitals:   02/11/22 1227  TempSrc: Oral  PainSc:                  Ebon Ketchum C Aalaya Yadao

## 2022-02-11 NOTE — H&P (Signed)
_0 @   Primary Care Physician:  Pablo Lawrence, NP Primary Gastroenterologist:  Dr. Gala Romney  Pre-Procedure History & Physical: HPI:  Lorraine Peters is a 51 y.o. female here for  for surveillance colonoscopy.  History multiple colonic adenomas removed 3 years ago.  Longstanding GERD.  Also, patient has iron deficiency anemia positive family history of rectal cancer in her sister.   No dysphagia.  No bowel symptoms.  Past Medical History:  Diagnosis Date   Breast cancer ([place])    Breast disorder    left breast at age 12   Cigarette nicotine dependence, uncomplicated 71/08/2692   Family history of breast cancer    Family history of colon cancer    H. pylori infection    History of kidney stones    Hypercholesterolemia    Hypertension    Migraine    Ovarian cyst    Personal history of radiation therapy    PONV (postoperative nausea and vomiting)    Reflux    right 05/23/2020   right    Past Surgical History:  Procedure Laterality Date   BREAST BIOPSY Left 04/2019   BREAST BIOPSY Right 05/23/2020   BREAST LUMPECTOMY Left 2020   COLONOSCOPY N/A 12/30/2018   six 4-7 mm polyps in rectum, descending colon, and ascending colon. Sigmoid and descending colon diverticulosis. Serrated and tubular adenoma. Surveillance 3 years.    ESOPHAGOGASTRODUODENOSCOPY N/A 09/12/2017   normal esophagus, small hiatal hernia, large pedunculated duodenal polyp s/p resection and clip placement. Benign polyp   PARTIAL MASTECTOMY WITH NEEDLE LOCALIZATION AND AXILLARY SENTINEL LYMPH NODE BX Left 04/29/2018   Procedure: PARTIAL MASTECTOMY WITH NEEDLE LOCALIZATION AND AXILLARY SENTINEL LYMPH NODE BX;  Surgeon: Virl Cagey, MD;  Location: [facility];  Service: General;  Laterality: Left;   POLYPECTOMY  12/30/2018   Procedure: POLYPECTOMY;  Surgeon: Daneil Dolin, MD;  Location: [facility];  Service: Endoscopy;;  ascending,descending,rectal   TUBAL LIGATION      Prior to Admission medications    Medication Sig Start Date End Date Taking? Authorizing Provider  ALPRAZolam (XANAX) 0.25 MG tablet Take 1 tablet (0.25 mg total) by mouth at bedtime as needed for anxiety. 08/20/21  Yes Derek Jack, MD  atorvastatin (LIPITOR) 10 MG tablet Take 1 tablet (10 mg total) by mouth daily. 01/22/22  Yes   CALCIUM PO Take 2,400 mg by mouth daily. D3   Yes [provider]  diazepam (VALIUM) 5 MG tablet Take 1 tablet (5 mg total) by mouth at bedtime as needed. Max daily amount is 1 tablet. Patient taking differently: Take 2.5-5 mg by mouth at bedtime as needed (Dizziness). 12/31/21  Yes   ferrous sulfate 325 (65 FE) MG EC tablet Take 325 mg by mouth daily with breakfast.   Yes [provider]  fluticasone (FLONASE) 50 MCG/ACT nasal spray Place 1 spray into both nostrils daily as needed for allergies.   Yes [provider]  hydrochlorothiazide (HYDRODIURIL) 25 MG tablet Take 1 tablet (25 mg total) by mouth every morning. Patient taking differently: Take 12.5 mg by mouth every morning. 07/19/21  Yes   levocetirizine (XYZAL) 5 MG tablet Take 5 mg by mouth every evening.   Yes [provider]  meclizine (ANTIVERT) 25 MG tablet Take 1 tablet (25 mg total) by mouth 3 (three) times daily as needed for dizziness. 12/20/21  Yes Mesner, Corene Cornea, MD  olmesartan (BENICAR) 40 MG tablet Take 1 tablet (40 mg total) by mouth daily. 01/03/22  Yes  omeprazole (PRILOSEC) 40 MG capsule Take 1 capsule (40 mg total) by mouth daily. 06/26/21  Yes Annitta Needs, NP  polyethylene glycol (MIRALAX / GLYCOLAX) 17 g packet Take 17 g by mouth at bedtime.   Yes [provider]  potassium chloride (KLOR-CON) 10 MEQ tablet Take 1 tablet (10 mEq total) by mouth daily. 01/23/22  Yes   Probiotic Product (PROBIOTIC PO) Take 1 tablet by mouth daily.   Yes [provider]  Sod Picosulfate-Mag Ox-Cit Acd (CLENPIQ) 10-3.5-12 MG-GM -GM/175ML SOLN Take 1 kit by mouth as directed. 01/15/22  Yes  Lolamae Voisin, [name], MD  tamoxifen (NOLVADEX) 20 MG tablet Take 1 tablet (20 mg total) by mouth daily. 12/17/21  Yes Derek Jack, MD  potassium chloride (KLOR-CON) 10 MEQ tablet Take 1 tablet (10 mEq total) by mouth daily. Patient not taking: Reported on 02/07/2022 12/31/21       Allergies as of 01/15/2022 - Review Complete 01/09/2022  Allergen Reaction Noted   Bee venom Swelling and Other (See Comments) 04/29/2011    Family History  Problem Relation Age of Onset   Arthritis Mother    Cancer Mother        breast   Breast cancer Mother 60       recurrance at 61   COPD Father    Heart disease Father    Hyperlipidemia Father    Hypertension Father    Cancer Sister 22       anal, deceased due to mva but had advanced disease   Stroke Maternal Grandfather    Breast cancer Maternal Aunt 58    Social History   Socioeconomic History   Marital status: Divorced    Spouse name: Not on file   Number of children: 1   Years of education: Not on file   Highest education level: Not on file  Occupational History   Not on file  Tobacco Use   Smoking status: Former    Packs/day: 0.50    Years: 28.00    Total pack years: 14.00    Types: Cigarettes    Quit date: 04/22/2018    Years since quitting: 3.8   Smokeless tobacco: Never  Vaping Use   Vaping Use: Never used  Substance and Sexual Activity   Alcohol use: No    Alcohol/week: 0.0 standard drinks of alcohol   Drug use: No   Sexual activity: Not Currently    Birth control/protection: Surgical    Comment: tubal  Other Topics Concern   Not on file  Social History Narrative   Not on file   Social Determinants of Health   Financial Resource Strain: Low Risk  (11/19/2021)   Overall Financial Resource Strain (CARDIA)    Difficulty of Paying Living Expenses: Not very hard  Food Insecurity: No Food Insecurity (11/19/2021)   Hunger Vital Sign    Worried About Running Out of Food in the Last Year: Never true    Ran Out of Food  in the Last Year: Never true  Transportation Needs: No Transportation Needs (11/19/2021)   PRAPARE - Hydrologist (Medical): No    Lack of Transportation (Non-Medical): No  Physical Activity: Insufficiently Active (11/19/2021)   Exercise Vital Sign    Days of Exercise per Week: 2 days    Minutes of Exercise per Session: 30 min  Stress: No Stress Concern Present (11/19/2021)   [place]    Feeling of Stress :  Only a little  Social Connections: Moderately Integrated (11/19/2021)   Social Connection and Isolation Panel [NHANES]    Frequency of Communication with Friends and Family: More than three times a week    Frequency of Social Gatherings with Friends and Family: Once a week    Attends Religious Services: More than 4 times per year    Active Member of [employer] or Organizations: Yes    Attends Music therapist: More than 4 times per year    Marital Status: Divorced  Intimate Partner Violence: Not At Risk (11/19/2021)   Humiliation, Afraid, Rape, and Kick questionnaire    Fear of Current or Ex-Partner: No    Emotionally Abused: No    Physically Abused: No    Sexually Abused: No    Review of Systems: See HPI, otherwise negative ROS  Physical Exam: BP (!) 144/93   Pulse (!) 103   Temp 98.5 F (36.9 C) (Oral)   Resp 18   Ht _0  (1.702 m)   Wt 88.5 kg   SpO2 98%   BMI 30.54 kg/m  General:   Alert,  Well-developed, well-nourished, pleasant and cooperative in NAD Skin:  Intact without significant lesions or rashes. Neck:  Supple; no masses or thyromegaly. No significant cervical adenopathy. Lungs:  Clear throughout to auscultation.   No wheezes, crackles, or rhonchi. No acute distress. Heart:  Regular rate and rhythm; no murmurs, clicks, rubs,  or gallops. Abdomen: Non-distended, normal bowel sounds.  Soft and nontender without appreciable mass or hepatosplenomegaly.   Impression/Plan:    51 year old lady with iron deficiency anemia.  History of multiple colonic adenomas removed 2020.  Longstanding GERD.  No dysphagia.  Positive family history of rectal cancer in her sister.  She is here for diagnostic EGD and things  surveillance colonoscopy per plan.The risks, benefits, limitations, imponderables and alternatives regarding both EGD and colonoscopy have been reviewed with the patient. Questions have been answered. All parties agreeable.       Notice: This dictation was prepared with Dragon dictation along with smaller phrase technology. Any transcriptional errors that result from this process are unintentional and may not be corrected upon review.

## 2022-02-11 NOTE — Op Note (Addendum)
[facility] Patient Name: Lorraine Peters Procedure Date: 02/11/2022 10:41 AM MRN: [id number] Date of Birth: 13-Feb-1971 Attending MD: Norvel Richards , MD, [id number] CSN: [id number] Age: 51 Admit Type: Outpatient Procedure:                Upper GI endoscopy Indications:              Iron deficiency anemia, Heartburn Providers:                Norvel Richards, MD, Janeece Riggers, RN, Ladoris Gene Technician, Technician Referring MD:              Medicines:                Propofol per Anesthesia Complications:            No immediate complications. Estimated Blood Loss:     Estimated blood loss: none. Procedure:                Pre-Anesthesia Assessment:                           - Prior to the procedure, a History and Physical                            was performed, and patient medications and                            allergies were reviewed. The patient's tolerance of                            previous anesthesia was also reviewed. The risks                            and benefits of the procedure and the sedation                            options and risks were discussed with the patient.                            All questions were answered, and informed consent                            was obtained. Prior Anticoagulants: The patient has                            taken no anticoagulant or antiplatelet agents. ASA                            Grade Assessment: II - A patient with mild systemic                            disease. After reviewing the risks and benefits,  the patient was deemed in satisfactory condition to                            undergo the procedure.                           After obtaining informed consent, the endoscope was                            passed under direct vision. Throughout the                            procedure, the patient's blood pressure, pulse, and                             oxygen saturations were monitored continuously. The                            GIF-H190 ([id number]) scope was introduced through the                            mouth, and advanced to the second part of duodenum.                            The upper GI endoscopy was accomplished without                            difficulty. The patient tolerated the procedure                            well. Scope In: 11:03:28 AM Scope Out: 11:06:37 AM Total Procedure Duration: 0 hours 3 minutes 9 seconds  Findings:      The examined esophagus was normal. Patulous EG junction.      Gastric cavity empty. Normal-appearing gastric mucosa. Patent pylorus.      A medium-sized hiatal hernia was present.      The duodenal bulb and second portion of the duodenum were normal. Impression:               - Normal esophagus. Patulous EG junction.                           - Medium-sized hiatal hernia. Otherwise, normal                            stomach.                           - Normal duodenal bulb and second portion of the                            duodenum.                           - No specimens collected. Moderate Sedation:      Moderate (conscious) sedation was personally administered by an  anesthesia professional. The following parameters were monitored: oxygen       saturation, heart rate, blood pressure, respiratory rate, EKG, adequacy       of pulmonary ventilation, and response to care. Recommendation:           - Patient has a contact number available for                            emergencies. The signs and symptoms of potential                            delayed complications were discussed with the                            patient. Return to normal activities tomorrow.                            Written discharge instructions were provided to the                            patient.                           - Advance diet as tolerated.                           - Continue present  medications.                           - Return to my office (date not yet determined).                            See colonoscopy report. Procedure Code(s):        --- Professional ---                           [phone], Esophagogastroduodenoscopy, flexible,                            transoral; diagnostic, including collection of                            specimen(s) by brushing or washing, when performed                            (separate procedure) Diagnosis Code(s):        --- Professional ---                           K44.9, Diaphragmatic hernia without obstruction or                            gangrene                           D50.9, Iron deficiency anemia, unspecified  R12, Heartburn CPT copyright 2022 [employer]. All rights reserved. The codes documented in this report are preliminary and upon coder review may  be revised to meet current compliance requirements. [name]. Williemae Muriel, MD Norvel Richards, MD 02/11/2022 11:10:40 AM This report has been signed electronically. Number of Addenda: 0

## 2022-02-11 NOTE — Discharge Instructions (Signed)
  Colonoscopy Discharge Instructions  Read the instructions outlined below and refer to this sheet in the next few weeks. These discharge instructions provide you with general information on caring for yourself after you leave the hospital. Your doctor may also give you specific instructions. While your treatment has been planned according to the most current medical practices available, unavoidable complications occasionally occur. If you have any problems or questions after discharge, call Dr. Gala Romney at [phone]. ACTIVITY You may resume your regular activity, but move at a slower pace for the next 24 hours.  Take frequent rest periods for the next 24 hours.  Walking will help get rid of the air and reduce the bloated feeling in your belly (abdomen).  No driving for 24 hours (because of the medicine (anesthesia) used during the test).   Do not sign any important legal documents or operate any machinery for 24 hours (because of the anesthesia used during the test).  NUTRITION Drink plenty of fluids.  You may resume your normal diet as instructed by your doctor.  Begin with a light meal and progress to your normal diet. Heavy or fried foods are harder to digest and may make you feel sick to your stomach (nauseated).  Avoid alcoholic beverages for 24 hours or as instructed.  MEDICATIONS You may resume your normal medications unless your doctor tells you otherwise.  WHAT YOU CAN EXPECT TODAY Some feelings of bloating in the abdomen.  Passage of more gas than usual.  Spotting of blood in your stool or on the toilet paper.  IF YOU HAD POLYPS REMOVED DURING THE COLONOSCOPY: No aspirin products for 7 days or as instructed.  No alcohol for 7 days or as instructed.  Eat a soft diet for the next 24 hours.  FINDING OUT THE RESULTS OF YOUR TEST Not all test results are available during your visit. If your test results are not back during the visit, make an appointment with your caregiver to find out the  results. Do not assume everything is normal if you have not heard from your caregiver or the medical facility. It is important for you to follow up on all of your test results.  SEEK IMMEDIATE MEDICAL ATTENTION IF: You have more than a spotting of blood in your stool.  Your belly is swollen (abdominal distention).  You are nauseated or vomiting.  You have a temperature over 101.  You have abdominal pain or discomfort that is severe or gets worse throughout the day.      3 polyps found and removed from your colon     further recommendations to follow pending review of pathology report     at patient request, I called Beatrix Shipper at [phone] -  reviewed findings and recommendations.

## 2022-02-11 NOTE — Op Note (Signed)
[facility] Patient Name: Lorraine Peters Procedure Date: 02/11/2022 10:40 AM MRN: [id number] Date of Birth: October 04, 1970 Attending MD: Norvel Richards , MD, [id number] CSN: [id number] Age: 51 Admit Type: Outpatient Procedure:                Colonoscopy Indications:              High risk colon cancer surveillance: Personal                            history of colonic polyps Providers:                Norvel Richards, MD, Janeece Riggers, RN, Ladoris Gene Technician, Technician Referring MD:              Medicines:                Propofol per Anesthesia Complications:            No immediate complications. Estimated Blood Loss:     Estimated blood loss was minimal. Procedure:                Pre-Anesthesia Assessment:                           - Prior to the procedure, a History and Physical                            was performed, and patient medications and                            allergies were reviewed. The patient's tolerance of                            previous anesthesia was also reviewed. The risks                            and benefits of the procedure and the sedation                            options and risks were discussed with the patient.                            All questions were answered, and informed consent                            was obtained. Prior Anticoagulants: The patient has                            taken no anticoagulant or antiplatelet agents. ASA                            Grade Assessment: III - A patient with severe  systemic disease. After reviewing the risks and                            benefits, the patient was deemed in satisfactory                            condition to undergo the procedure.                           After obtaining informed consent, the colonoscope                            was passed under direct vision. Throughout the                             procedure, the patient's blood pressure, pulse, and                            oxygen saturations were monitored continuously. The                            [id number]) scope was introduced through the                            anus and advanced to the the cecum, identified by                            appendiceal orifice and ileocecal valve. The                            ileocecal valve, appendiceal orifice, and rectum                            were photographed. Scope In: 11:12:19 AM Scope Out: 23:36:12 AM Scope Withdrawal Time: 0 hours 18 minutes 0 seconds  Total Procedure Duration: 0 hours 24 minutes 23 seconds  Findings:      The perianal and digital rectal examinations were normal.      Three semi-pedunculated polyps were found in the descending colon. The       polyps were 3 to 6 mm in size. These polyps were removed with a cold       snare. Resection and retrieval were complete. Estimated blood loss was       minimal.      The exam was otherwise without abnormality on direct and retroflexion       views. Impression:               - Three 3 to 6 mm polyps in the descending colon,                            removed with a cold snare. Resected and retrieved.                           - The examination was otherwise normal on direct  and retroflexion views. Moderate Sedation:      Moderate (conscious) sedation was personally administered by an       anesthesia professional. The following parameters were monitored: oxygen       saturation, heart rate, blood pressure, respiratory rate, EKG, adequacy       of pulmonary ventilation, and response to care. Recommendation:           - Patient has a contact number available for                            emergencies. The signs and symptoms of potential                            delayed complications were discussed with the                            patient. Return to normal activities tomorrow.                             Written discharge instructions were provided to the                            patient.                           - Advance diet as tolerated.                           - Continue present medications.                           - Repeat colonoscopy date to be determined after                            pending pathology results are reviewed for                            surveillance.                           - Return to GI office (date not yet determined).                            See EGD report Procedure Code(s):        --- Professional ---                           [phone], Colonoscopy, flexible; with removal of                            tumor(s), polyp(s), or other lesion(s) by snare                            technique Diagnosis Code(s):        --- Professional ---  Z86.010, Personal history of colonic polyps                           D12.4, Benign neoplasm of descending colon CPT copyright 2022 [employer]. All rights reserved. The codes documented in this report are preliminary and upon coder review may  be revised to meet current compliance requirements. [name]. Shaunika Italiano, MD Norvel Richards, MD 02/11/2022 11:40:26 AM This report has been signed electronically. Number of Addenda: 0

## 2022-02-11 NOTE — Anesthesia Preprocedure Evaluation (Addendum)
Anesthesia Evaluation  Patient identified by MRN, date of birth, ID band Patient awake    Reviewed: Allergy & Precautions, H&P , NPO status , Patient's Chart, lab work & pertinent test results  History of Anesthesia Complications (+) PONV and history of anesthetic complications  Airway Mallampati: II  TM Distance: >3 FB Neck ROM: Full    Dental  (+) Dental Advisory Given, Missing   Pulmonary former smoker   Pulmonary exam normal breath sounds clear to auscultation       Cardiovascular Exercise Tolerance: Good hypertension, Pt. on medications Normal cardiovascular exam Rhythm:Regular Rate:Normal     Neuro/Psych  Headaches  Neuromuscular disease  negative psych ROS   GI/Hepatic Neg liver ROS,GERD  Medicated and Controlled,,  Endo/Other  negative endocrine ROS    Renal/GU negative Renal ROS  negative genitourinary   Musculoskeletal negative musculoskeletal ROS (+)    Abdominal   Peds negative pediatric ROS (+)  Hematology  (+) Blood dyscrasia, anemia   Anesthesia Other Findings Left breast cancer  Reproductive/Obstetrics negative OB ROS                             Anesthesia Physical Anesthesia Plan  ASA: 2  Anesthesia Plan: General   Post-op Pain Management: Minimal or no pain anticipated   Induction: Intravenous  PONV Risk Score and Plan: 1 and Propofol infusion  Airway Management Planned: Nasal Cannula and Natural Airway  Additional Equipment:   Intra-op Plan:   Post-operative Plan:   Informed Consent: I have reviewed the patients History and Physical, chart, labs and discussed the procedure including the risks, benefits and alternatives for the proposed anesthesia with the patient or authorized representative who has indicated his/her understanding and acceptance.     Dental advisory given  Plan Discussed with: CRNA and Surgeon  Anesthesia Plan Comments:         Anesthesia Quick Evaluation

## 2022-02-11 NOTE — Transfer of Care (Signed)
Immediate Anesthesia Transfer of Care Note  Patient: Lorraine Peters  Procedure(s) Performed: COLONOSCOPY WITH PROPOFOL ESOPHAGOGASTRODUODENOSCOPY (EGD) WITH PROPOFOL POLYPECTOMY  Patient Location: PACU  Anesthesia Type:General  Level of Consciousness: awake, alert , and oriented  Airway & Oxygen Therapy: Patient Spontanous Breathing  Post-op Assessment: Report given to RN  Post vital signs: Reviewed and stable  Last Vitals:  Vitals Value Taken Time  BP 160/57 02/11/22 1142  Temp 37.2 C 02/11/22 1142  Pulse 81 02/11/22 1139  Resp 16 02/11/22 1142  SpO2 98 % 02/11/22 1142    Last Pain:  Vitals:   02/11/22 1227  TempSrc: Oral  PainSc:       Patients Stated Pain Goal: 6 (01[birth year] 0802)  Complications: No notable events documented.

## 2022-02-12 LAB — SURGICAL PATHOLOGY

## 2022-02-13 ENCOUNTER — Other Ambulatory Visit: Payer: 59

## 2022-02-14 ENCOUNTER — Encounter: Payer: Self-pay | Admitting: Internal Medicine

## 2022-02-18 ENCOUNTER — Encounter (HOSPITAL_COMMUNITY): Payer: Self-pay | Admitting: Internal Medicine

## 2022-02-19 ENCOUNTER — Encounter: Payer: Self-pay | Admitting: Hematology

## 2022-02-19 ENCOUNTER — Inpatient Hospital Stay: Payer: 59 | Attending: Hematology | Admitting: Hematology

## 2022-02-19 VITALS — BP 132/71 | HR 80 | Temp 97.7°F | Resp 18 | Wt 196.0 lb

## 2022-02-19 DIAGNOSIS — D0512 Intraductal carcinoma in situ of left breast: Secondary | ICD-10-CM | POA: Insufficient documentation

## 2022-02-19 DIAGNOSIS — D509 Iron deficiency anemia, unspecified: Secondary | ICD-10-CM | POA: Diagnosis not present

## 2022-02-19 DIAGNOSIS — Z79810 Long term (current) use of selective estrogen receptor modulators (SERMs): Secondary | ICD-10-CM | POA: Insufficient documentation

## 2022-02-19 DIAGNOSIS — Z79899 Other long term (current) drug therapy: Secondary | ICD-10-CM | POA: Insufficient documentation

## 2022-02-19 NOTE — Progress Notes (Signed)
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  The Common Hereditary Gene Panel offered by [facility] includes sequencing and/or deletion duplication testing of the following 48 genes: APC, ATM, AXIN2, BARD1, BMPR1A, BRCA1, BRCA2, BRIP1, CDH1, CDK4, CDKN2A (p14ARF), CDKN2A (p16INK4a), CHEK2, CTNNA1, DICER1, EPCAM (Deletion/duplication testing only), GREM1 (promoter region deletion/duplication testing only), KIT, MEN1, MLH1, MSH2, MSH3, MSH6, MUTYH, NBN, NF1, NHTL1, PALB2, PDGFRA, PMS2, POLD1, POLE, PTEN, RAD50, RAD51C, RAD51D, RNF43, SDHB, SDHC, SDHD, SMAD4, SMARCA4. STK11, TP53, TSC1, TSC2, and VHL.  The following genes were evaluated for sequence changes only: SDHA and HOXB13 c.251G>A variant only. The report date is October 30, 2018.     CHIEF COMPLIANT: Follow-up for left breast high-grade DCIS   INTERVAL HISTORY: Ms. ANNETH BRUNELL is a 51 y.o. female seen for follow-up of left breast DCIS.  She is tolerating tamoxifen reasonably well.  Energy levels are 75%.  She also started taking iron tablet which was started on 12/17/2021.  She reports constipation is not a major problem as she is taking MiraLAX daily.  Iron tablet made her feel better.  REVIEW OF SYSTEMS:   Review of Systems  Neurological:  Positive for dizziness.  All other systems reviewed and are negative.   I have reviewed the past medical history, past surgical history, social history and family history with the patient and they are  unchanged from previous note.   ALLERGIES:   is allergic to bee venom.   MEDICATIONS:  Current Outpatient Medications  Medication Sig Dispense Refill   ALPRAZolam (XANAX) 0.25 MG tablet Take 1 tablet (0.25 mg total) by mouth at bedtime as needed for anxiety. 30 tablet 2   atorvastatin (LIPITOR) 10 MG tablet Take 1 tablet (10 mg total) by mouth daily. 90 tablet 0   CALCIUM PO Take 2,400 mg by mouth daily. D3     diazepam (VALIUM) 5 MG tablet Take 1 tablet (5 mg total) by mouth at bedtime as needed. Max daily amount is 1 tablet. (Patient taking differently: Take 2.5-5 mg by mouth at bedtime as needed (Dizziness).) 30 tablet 5   ferrous sulfate 325 (65 FE) MG EC tablet Take 325 mg by mouth daily with breakfast.     fluticasone (FLONASE) 50 MCG/ACT nasal spray Place 1 spray into both nostrils daily as needed for allergies.     hydrochlorothiazide (HYDRODIURIL) 25 MG tablet Take 1 tablet (25 mg total) by mouth every morning. (Patient taking differently: Take 12.5 mg by mouth every morning.) 90 tablet 2   levocetirizine (XYZAL) 5 MG tablet Take 5 mg by mouth every evening.     meclizine (ANTIVERT) 25 MG tablet Take 1 tablet (25 mg total) by mouth 3 (three) times daily as needed for dizziness. 30 tablet 0   olmesartan (BENICAR) 40 MG tablet Take 1 tablet (40 mg total) by mouth daily. 90 tablet 1   omeprazole (PRILOSEC) 40 MG capsule Take 1 capsule (40 mg total) by mouth daily. 90 capsule 3   polyethylene glycol (MIRALAX / GLYCOLAX) 17 g packet Take 17 g  by mouth at bedtime.     potassium chloride (KLOR-CON) 10 MEQ tablet Take 1 tablet (10 mEq total) by mouth daily. 30 tablet 5   Probiotic Product (PROBIOTIC PO) Take 1 tablet by mouth daily.     tamoxifen (NOLVADEX) 20 MG tablet Take 1 tablet (20 mg total) by mouth daily. 30 tablet 11   No current facility-administered medications for this visit.     PHYSICAL EXAMINATION: Performance status (ECOG): 0 - Asymptomatic  Vitals:   02/19/22 1125   BP: 132/71  Pulse: 80  Resp: 18  Temp: 97.7 F (36.5 C)  SpO2: 100%   Wt Readings from Last 3 Encounters:  02/19/22 195 lb 15.8 oz (88.9 kg)  02/11/22 195 lb (88.5 kg)  01/09/22 195 lb (88.5 kg)   Physical Exam Vitals reviewed.  Constitutional:      Appearance: Normal appearance.  Cardiovascular:     Rate and Rhythm: Normal rate and regular rhythm.     Pulses: Normal pulses.     Heart sounds: Normal heart sounds.  Pulmonary:     Effort: Pulmonary effort is normal.     Breath sounds: Normal breath sounds.  Neurological:     General: No focal deficit present.     Mental Status: She is alert and oriented to person, place, and time.  Psychiatric:        Mood and Affect: Mood normal.        Behavior: Behavior normal.     Breast Exam Chaperone: Thana Ates     LABORATORY DATA:  I have reviewed the data as listed    Latest Ref Rng & Units 02/04/2022   10:46 AM 12/19/2021    8:37 PM 12/10/2021    9:33 AM  CMP  Glucose 70 - 99 mg/dL 112  135  107   BUN 6 - 20 mg/dL _0 Creatinine 0.44 - 1.00 mg/dL 0.51  0.52  0.45   Sodium 135 - 145 mmol/L 141  135  139   Potassium 3.5 - 5.1 mmol/L 4.0  2.6  3.7   Chloride 98 - 111 mmol/L 108  101  105   CO2 22 - 32 mmol/L _1 Calcium 8.9 - 10.3 mg/dL 9.0  8.9  8.9   Total Protein 6.5 - 8.1 g/dL  6.4  6.3   Total Bilirubin 0.3 - 1.2 mg/dL  0.4  0.1   Alkaline Phos 38 - 126 U/L  50  49   AST 15 - 41 U/L  19  17   ALT 0 - 44 U/L  18  14    No results found for: "CAN153" Lab Results  Component Value Date   WBC 6.4 02/04/2022   HGB 13.4 02/04/2022   HCT 41.4 02/04/2022   MCV 86.4 02/04/2022   PLT 273 02/04/2022   NEUTROABS 5.7 12/19/2021    ASSESSMENT:  1.  Left breast high-grade DCIS: -Lumpectomy on 04/29/2018, 0.5 cm high-grade DCIS, ER 70%, PR 80%, margins negative, 0/1 lymph node positive, pTis PN 0. -She received radiation therapy. -Tamoxifen started on 05/12/2018. -Bilateral mammogram on 04/06/2019  showed left breast calcifications. -Left upper outer quadrant needle biopsy on 04/15/2018 shows benign breast tissue with calcifications. -She had mammogram of the bilateral breasts on 05/15/2020 which showed indeterminate 4 mm group of calcifications in the upper outer quadrant of the right breast. -She had biopsy on 05/23/2020 which was consistent with fibrocystic change with calcifications, usual ductal  hyperplasia and sclerosing adenosis. -Endometrial biopsy on 02/14/2020 shows benign endometrial type polyp with proliferative endometrium with no hyperplasia or malignancy.  She denies any spotting at this time.   2.  Mild hypoproteinemia: -She has mild hypoalbuminemia dating back several years.  She also has mild leg swellings. -She was instructed to eat high-protein diet.   3.  Family history: -Mother had breast and lung cancer.  Maternal aunt had breast cancer.  Maternal uncle had colon cancer.  Sister had anal cancer. -Genetic testing was negative.   PLAN:  1.  Left breast high-grade DCIS: - She is tolerating tamoxifen very well. - Continue tamoxifen.  Will arrange for screening mammogram in March 2024. - RTC 4 months for follow-up.   2.  Bone health: - Continue calcium and vitamin D.  Last vitamin D was 38.   3.  Iron deficiency anemia: - Iron tablet daily was started on 12/17/2021. - Colonoscopy and EGD on 02/11/2022: 3 tubular adenomas resected.  No obvious bleeding source. - Constipation is fairly controlled well with MiraLAX daily. - Reviewed labs from 02/04/2022.  Ferritin improved to 14.  Hemoglobin improved to 13.4.  Will check ferritin and iron panel in 4 months.  Breast Cancer therapy associated bone loss: I have recommended calcium, Vitamin D and weight bearing exercises.  Orders placed this encounter:  No orders of the defined types were placed in this encounter.   The patient has a good understanding of the overall plan. She agrees with it. She will call with any  problems that may develop before the next visit here.  Derek Jack, MD [place] [phone]     '

## 2022-02-20 ENCOUNTER — Other Ambulatory Visit (HOSPITAL_COMMUNITY): Payer: Self-pay

## 2022-02-20 ENCOUNTER — Other Ambulatory Visit: Payer: Self-pay

## 2022-02-28 ENCOUNTER — Other Ambulatory Visit (HOSPITAL_COMMUNITY): Payer: Self-pay

## 2022-02-28 MED ORDER — POTASSIUM CHLORIDE ER 10 MEQ PO TBCR
10.0000 meq | EXTENDED_RELEASE_TABLET | Freq: Every day | ORAL | 4 refills | Status: DC
  Filled 2022-02-28: qty 30, 30d supply, fill #0
  Filled 2022-03-18 – 2022-03-25 (×2): qty 30, 30d supply, fill #1
  Filled 2022-04-24: qty 30, 30d supply, fill #2
  Filled 2022-05-20 – 2022-06-18 (×3): qty 30, 30d supply, fill #3

## 2022-03-18 ENCOUNTER — Other Ambulatory Visit (HOSPITAL_COMMUNITY): Payer: Self-pay

## 2022-03-19 ENCOUNTER — Other Ambulatory Visit (HOSPITAL_COMMUNITY): Payer: Self-pay

## 2022-03-19 MED ORDER — HYDROCHLOROTHIAZIDE 25 MG PO TABS
25.0000 mg | ORAL_TABLET | Freq: Every morning | ORAL | 2 refills | Status: DC
  Filled 2022-03-19 – 2022-07-17 (×2): qty 90, 90d supply, fill #0
  Filled 2022-09-18 – 2022-09-30 (×2): qty 90, 90d supply, fill #1

## 2022-03-19 MED ORDER — ATORVASTATIN CALCIUM 10 MG PO TABS
10.0000 mg | ORAL_TABLET | Freq: Every day | ORAL | 0 refills | Status: DC
  Filled 2022-03-19 – 2022-04-24 (×2): qty 90, 90d supply, fill #0

## 2022-04-24 ENCOUNTER — Other Ambulatory Visit (HOSPITAL_COMMUNITY): Payer: Self-pay

## 2022-04-24 ENCOUNTER — Other Ambulatory Visit: Payer: Self-pay

## 2022-04-25 ENCOUNTER — Other Ambulatory Visit (HOSPITAL_COMMUNITY): Payer: Self-pay

## 2022-04-25 ENCOUNTER — Other Ambulatory Visit: Payer: Self-pay

## 2022-05-03 ENCOUNTER — Other Ambulatory Visit (HOSPITAL_COMMUNITY): Payer: Self-pay

## 2022-05-03 MED ORDER — FLUTICASONE PROPIONATE 50 MCG/ACT NA SUSP
2.0000 | Freq: Every day | NASAL | 5 refills | Status: AC
  Filled 2022-05-03: qty 16, 30d supply, fill #0

## 2022-05-08 ENCOUNTER — Other Ambulatory Visit (HOSPITAL_COMMUNITY): Payer: Self-pay

## 2022-05-20 ENCOUNTER — Other Ambulatory Visit (HOSPITAL_COMMUNITY): Payer: Self-pay

## 2022-05-20 ENCOUNTER — Ambulatory Visit
Admission: RE | Admit: 2022-05-20 | Discharge: 2022-05-20 | Disposition: A | Discharge status: Home / Self Care | Payer: 59 | Source: Ambulatory Visit | Attending: Hematology | Admitting: Hematology

## 2022-05-20 DIAGNOSIS — Z1231 Encounter for screening mammogram for malignant neoplasm of breast: Secondary | ICD-10-CM | POA: Diagnosis not present

## 2022-05-21 ENCOUNTER — Other Ambulatory Visit (HOSPITAL_COMMUNITY): Payer: Self-pay

## 2022-05-22 ENCOUNTER — Other Ambulatory Visit: Payer: Self-pay

## 2022-05-22 ENCOUNTER — Other Ambulatory Visit (HOSPITAL_COMMUNITY): Payer: Self-pay

## 2022-06-18 ENCOUNTER — Other Ambulatory Visit: Payer: Self-pay | Admitting: Gastroenterology

## 2022-06-18 ENCOUNTER — Other Ambulatory Visit (HOSPITAL_COMMUNITY): Payer: Self-pay

## 2022-06-18 ENCOUNTER — Other Ambulatory Visit: Payer: Self-pay

## 2022-06-18 DIAGNOSIS — D509 Iron deficiency anemia, unspecified: Secondary | ICD-10-CM

## 2022-06-19 ENCOUNTER — Other Ambulatory Visit (HOSPITAL_COMMUNITY): Payer: Self-pay

## 2022-06-19 ENCOUNTER — Inpatient Hospital Stay: Payer: 59 | Attending: Hematology

## 2022-06-19 ENCOUNTER — Other Ambulatory Visit: Payer: Self-pay

## 2022-06-19 DIAGNOSIS — Z79810 Long term (current) use of selective estrogen receptor modulators (SERMs): Secondary | ICD-10-CM | POA: Insufficient documentation

## 2022-06-19 DIAGNOSIS — D509 Iron deficiency anemia, unspecified: Secondary | ICD-10-CM | POA: Insufficient documentation

## 2022-06-19 DIAGNOSIS — D0512 Intraductal carcinoma in situ of left breast: Secondary | ICD-10-CM | POA: Insufficient documentation

## 2022-06-19 LAB — CBC WITH DIFFERENTIAL/PLATELET
Abs Immature Granulocytes: 0.02 10*3/uL (ref 0.00–0.07)
Basophils Absolute: 0 10*3/uL (ref 0.0–0.1)
Basophils Relative: 0 %
Eosinophils Absolute: 0.1 10*3/uL (ref 0.0–0.5)
Eosinophils Relative: 1 %
HCT: 43.2 % (ref 36.0–46.0)
Hemoglobin: 14.2 g/dL (ref 12.0–15.0)
Immature Granulocytes: 0 %
Lymphocytes Relative: 40 %
Lymphs Abs: 3.4 10*3/uL (ref 0.7–4.0)
MCH: 29.5 pg (ref 26.0–34.0)
MCHC: 32.9 g/dL (ref 30.0–36.0)
MCV: 89.6 fL (ref 80.0–100.0)
Monocytes Absolute: 0.6 10*3/uL (ref 0.1–1.0)
Monocytes Relative: 7 %
Neutro Abs: 4.5 10*3/uL (ref 1.7–7.7)
Neutrophils Relative %: 52 %
Platelets: 230 10*3/uL (ref 150–400)
RBC: 4.82 MIL/uL (ref 3.87–5.11)
RDW: 13.1 % (ref 11.5–15.5)
WBC: 8.6 10*3/uL (ref 4.0–10.5)
nRBC: 0 % (ref 0.0–0.2)

## 2022-06-19 LAB — COMPREHENSIVE METABOLIC PANEL
ALT: 17 U/L (ref 0–44)
AST: 14 U/L — ABNORMAL LOW (ref 15–41)
Albumin: 4 g/dL (ref 3.5–5.0)
Alkaline Phosphatase: 61 U/L (ref 38–126)
Anion gap: 12 (ref 5–15)
BUN: 13 mg/dL (ref 6–20)
CO2: 23 mmol/L (ref 22–32)
Calcium: 9.3 mg/dL (ref 8.9–10.3)
Chloride: 103 mmol/L (ref 98–111)
Creatinine, Ser: 0.5 mg/dL (ref 0.44–1.00)
GFR, Estimated: >60 mL/min (ref >60)
Glucose, Bld: 87 mg/dL (ref 70–99)
Potassium: 3.4 mmol/L — ABNORMAL LOW (ref 3.5–5.1)
Sodium: 138 mmol/L (ref 135–145)
Total Bilirubin: 0.5 mg/dL (ref 0.3–1.2)
Total Protein: 6.7 g/dL (ref 6.5–8.1)

## 2022-06-19 LAB — IRON AND TIBC
Iron: 83 ug/dL (ref 28–170)
Saturation Ratios: 22 % (ref 10.4–31.8)
TIBC: 370 ug/dL (ref 250–450)
UIBC: 287 ug/dL

## 2022-06-19 LAB — FERRITIN: Ferritin: 24 ng/mL (ref 11–307)

## 2022-06-19 MED ORDER — OMEPRAZOLE 40 MG PO CPDR
40.0000 mg | DELAYED_RELEASE_CAPSULE | Freq: Every day | ORAL | 3 refills | Status: DC
  Filled 2022-06-19: qty 90, 90d supply, fill #0
  Filled 2022-09-18: qty 90, 90d supply, fill #1

## 2022-06-20 DIAGNOSIS — L820 Inflamed seborrheic keratosis: Secondary | ICD-10-CM | POA: Diagnosis not present

## 2022-06-26 ENCOUNTER — Inpatient Hospital Stay (HOSPITAL_BASED_OUTPATIENT_CLINIC_OR_DEPARTMENT_OTHER): Payer: 59 | Admitting: Hematology

## 2022-06-26 VITALS — BP 129/76 | HR 72 | Temp 98.7°F | Ht 67.0 in | Wt 198.2 lb

## 2022-06-26 DIAGNOSIS — Z79810 Long term (current) use of selective estrogen receptor modulators (SERMs): Secondary | ICD-10-CM | POA: Diagnosis not present

## 2022-06-26 DIAGNOSIS — D0512 Intraductal carcinoma in situ of left breast: Secondary | ICD-10-CM | POA: Diagnosis not present

## 2022-06-26 DIAGNOSIS — D509 Iron deficiency anemia, unspecified: Secondary | ICD-10-CM | POA: Diagnosis not present

## 2022-06-26 NOTE — Progress Notes (Signed)
[facility] [address] [place] [address]    Clinic Day:  06/26/2022  Referring physician: Roe Rutherford, NP  Patient Care Team: Roe Rutherford, NP as PCP - General (Adult Health Nurse Practitioner) [place] Bali, MD (Inactive) as Consulting Physician (Gastroenterology)   ASSESSMENT & PLAN:   Assessment: 1.  Left breast high-grade DCIS: -Lumpectomy on 04/29/2018, 0.5 cm high-grade DCIS, ER 70%, PR 80%, margins negative, 0/1 lymph node positive, pTis PN 0. -She received radiation therapy. -Tamoxifen started on 05/12/2018. -Bilateral mammogram on 04/06/2019 showed left breast calcifications. -Left upper outer quadrant needle biopsy on 04/15/2018 shows benign breast tissue with calcifications. -She had mammogram of the bilateral breasts on 05/15/2020 which showed indeterminate 4 mm group of calcifications in the upper outer quadrant of the right breast. -She had biopsy on 05/23/2020 which was consistent with fibrocystic change with calcifications, usual ductal hyperplasia and sclerosing adenosis. -Endometrial biopsy on 02/14/2020 shows benign endometrial type polyp with proliferative endometrium with no hyperplasia or malignancy.  She denies any spotting at this time.   2.  Mild hypoproteinemia: -She has mild hypoalbuminemia dating back several years.  She also has mild leg swellings. -She was instructed to eat high-protein diet.   3.  Family history: -Mother had breast and lung cancer.  Maternal aunt had breast cancer.  Maternal uncle had colon cancer.  Sister had anal cancer. -Genetic testing was negative.  Plan: 1.  Left breast high-grade DCIS: - She is tolerating tamoxifen very well. - She will have a Pap smear in July of this year.  No spotting or bleeding reported. - Mammogram (05/20/2022): BI-RADS Category 1. - Labs today: LFTs and CBC are normal. - Continue tamoxifen.  Recommend follow-up after mammogram in March next year with repeat labs and exam.   She will finish 5 years of tamoxifen at that time.   2.  Bone health: - Continue calcium and vitamin D supplements.  Last vitamin D was normal.   3.  Iron deficiency anemia: - She is tolerating iron tablet daily with MiraLAX. - Ferritin is 24, up from 14.  Hemoglobin is 14.2.  She reports improvement in energy levels.  Continue iron tablet daily.  Orders Placed This Encounter  Procedures   MM 3D SCREENING MAMMOGRAM BILATERAL BREAST    Standing Status:   Future    Standing Expiration Date:   06/26/2023    Order Specific Question:   Reason for Exam (SYMPTOM  OR DIAGNOSIS REQUIRED)    Answer:   screening for breast cancer    Order Specific Question:   Is the patient pregnant?    Answer:   No    Order Specific Question:   Preferred imaging location?    Answer:   [facility]    Order Specific Question:   Release to patient    Answer:   Immediate   CBC with Differential/Platelet    Standing Status:   Future    Standing Expiration Date:   06/26/2023    Order Specific Question:   Release to patient    Answer:   Immediate   VITAMIN D 25 Hydroxy (Vit-D Deficiency, Fractures)    Standing Status:   Future    Standing Expiration Date:   06/26/2023    Order Specific Question:   Release to patient    Answer:   Immediate   Ferritin    Standing Status:   Future    Standing Expiration Date:   06/26/2023  Order Specific Question:   Release to patient    Answer:   Immediate   Iron and TIBC    Standing Status:   Future    Standing Expiration Date:   06/26/2023    Order Specific Question:   Release to patient    Answer:   Immediate   Comprehensive metabolic panel    Standing Status:   Future    Standing Expiration Date:   06/26/2023    Order Specific Question:   Release to patient    Answer:   Immediate      I,Alexis Herring,acting as a scribe for Doreatha Massed, MD.,have documented all relevant documentation on the behalf of Doreatha Massed, MD,as directed by  Doreatha Massed, MD while in the presence of Doreatha Massed, MD.   I, Doreatha Massed MD, have reviewed the above documentation for accuracy and completeness, and I agree with the above.   Doreatha Massed, MD   4/17/20244:38 PM  CHIEF COMPLAINT:   Diagnosis: left breast high-grade DCIS    Cancer Staging  No matching staging information was found for the patient.   Prior Therapy:  Lumpectomy on 04/29/2018  radiation therapy   Current Therapy:  Tamoxifen started on 05/12/2018   HISTORY OF PRESENT ILLNESS:   Oncology History  Ductal carcinoma in situ (DCIS) of left breast with comedonecrosis  04/22/2018 Initial Diagnosis   Ductal carcinoma in situ (DCIS) of left breast with comedonecrosis   10/30/2018 Genetic Testing   Negative genetic testing on the common hereditary cancer panel.  The Common Hereditary Gene Panel offered by [facility] includes sequencing and/or deletion duplication testing of the following 48 genes: APC, ATM, AXIN2, BARD1, BMPR1A, BRCA1, BRCA2, BRIP1, CDH1, CDK4, CDKN2A (p14ARF), CDKN2A (p16INK4a), CHEK2, CTNNA1, DICER1, EPCAM (Deletion/duplication testing only), GREM1 (promoter region deletion/duplication testing only), KIT, MEN1, MLH1, MSH2, MSH3, MSH6, MUTYH, NBN, NF1, NHTL1, PALB2, PDGFRA, PMS2, POLD1, POLE, PTEN, RAD50, RAD51C, RAD51D, RNF43, SDHB, SDHC, SDHD, SMAD4, SMARCA4. STK11, TP53, TSC1, TSC2, and VHL.  The following genes were evaluated for sequence changes only: SDHA and HOXB13 c.251G>A variant only. The report date is October 30, 2018.      INTERVAL HISTORY:   Lorraine Peters is a 52 y.o. female presenting to clinic today for follow up of left breast high-grade DCIS. She was last seen by me on 02/19/22.  Today, she states that she is doing well overall. Her appetite level is at 100%. Her energy level is at 100%. She reports compliance with Tamoxifen with good tolerance. She denies any hot flashes. She denies any vaginal spotting. She is scheduled for her  next routine pap smear in July 2024. She reports compliance with oral iron and potassium supplementation. She is trying to incorporate more higher potassium-containing foods. Patient reports nocturia for several years since starting HCTZ. This improved after her HCTZ was cut in half by her PCP. She is scheduled to see her PCP for follow up on 07/15/22. She experienced tingling in her feet lasting x2 minutes at a time. This only occurs after propping her feet up at night. She has occasional dizziness associated with her h/o vertigo. She has chronic left ankle swelling since having an injury in her 56s.  PAST MEDICAL HISTORY:   Past Medical History: Past Medical History:  Diagnosis Date   Breast cancer (HCC)    Breast disorder    left breast at age 29   Cigarette nicotine dependence, uncomplicated 02/21/2015   Family history of breast cancer    Family history of  carcinoma in situ of anal canal 09/01/2017   Family history of colon cancer    H. pylori infection    History of kidney stones    Hypercholesterolemia    Hypertension    Migraine    Ovarian cyst    Personal history of radiation therapy    PONV (postoperative nausea and vomiting)    Reflux    right 05/23/2020   right    Surgical History: Past Surgical History:  Procedure Laterality Date   BREAST BIOPSY Left 04/2019   BREAST BIOPSY Right 05/23/2020   BREAST LUMPECTOMY Left 2020   COLONOSCOPY N/A 12/30/2018   six 4-7 mm polyps in rectum, descending colon, and ascending colon. Sigmoid and descending colon diverticulosis. Serrated and tubular adenoma. Surveillance 3 years.    COLONOSCOPY WITH PROPOFOL N/A 02/11/2022   Procedure: COLONOSCOPY WITH PROPOFOL;  Surgeon: Corbin Ade, MD;  Location: [facility];  Service: Endoscopy;  Laterality: N/A;  9:45am, asa 2   ESOPHAGOGASTRODUODENOSCOPY N/A 09/12/2017   normal esophagus, small hiatal hernia, large pedunculated duodenal polyp s/p resection and clip placement. Benign polyp    ESOPHAGOGASTRODUODENOSCOPY (EGD) WITH PROPOFOL N/A 02/11/2022   Procedure: ESOPHAGOGASTRODUODENOSCOPY (EGD) WITH PROPOFOL;  Surgeon: Corbin Ade, MD;  Location: [facility];  Service: Endoscopy;  Laterality: N/A;   PARTIAL MASTECTOMY WITH NEEDLE LOCALIZATION AND AXILLARY SENTINEL LYMPH NODE BX Left 04/29/2018   Procedure: PARTIAL MASTECTOMY WITH NEEDLE LOCALIZATION AND AXILLARY SENTINEL LYMPH NODE BX;  Surgeon: Lucretia Roers, MD;  Location: [facility];  Service: General;  Laterality: Left;   POLYPECTOMY  12/30/2018   Procedure: POLYPECTOMY;  Surgeon: Corbin Ade, MD;  Location: [facility];  Service: Endoscopy;;  ascending,descending,rectal   POLYPECTOMY  02/11/2022   Procedure: POLYPECTOMY;  Surgeon: Corbin Ade, MD;  Location: [facility];  Service: Endoscopy;;   TUBAL LIGATION      Social History: Social History   Socioeconomic History   Marital status: Divorced    Spouse name: Not on file   Number of children: 1   Years of education: Not on file   Highest education level: Not on file  Occupational History   Not on file  Tobacco Use   Smoking status: Former    Packs/day: 0.50    Years: 28.00    Additional pack years: 0.00    Total pack years: 14.00    Types: Cigarettes    Quit date: 04/22/2018    Years since quitting: 4.1   Smokeless tobacco: Never  Vaping Use   Vaping Use: Never used  Substance and Sexual Activity   Alcohol use: No    Alcohol/week: 0.0 standard drinks of alcohol   Drug use: No   Sexual activity: Not Currently    Birth control/protection: Surgical    Comment: tubal  Other Topics Concern   Not on file  Social History Narrative   Not on file   Social Determinants of Health   Financial Resource Strain: Low Risk  (11/19/2021)   Overall Financial Resource Strain (CARDIA)    Difficulty of Paying Living Expenses: Not very hard  Food Insecurity: No Food Insecurity (11/19/2021)   Hunger Vital Sign    Worried About Running Out of Food in  the Last Year: Never true    Ran Out of Food in the Last Year: Never true  Transportation Needs: No Transportation Needs (11/19/2021)   PRAPARE - Administrator, Civil Service (Medical): No    Lack of Transportation (Non-Medical):  No  Physical Activity: Insufficiently Active (11/19/2021)   Exercise Vital Sign    Days of Exercise per Week: 2 days    Minutes of Exercise per Session: 30 min  Stress: No Stress Concern Present (11/19/2021)   [employer] of Occupational Health - Occupational Stress Questionnaire    Feeling of Stress : Only a little  Social Connections: Moderately Integrated (11/19/2021)   Social Connection and Isolation Panel [NHANES]    Frequency of Communication with Friends and Family: More than three times a week    Frequency of Social Gatherings with Friends and Family: Once a week    Attends Religious Services: More than 4 times per year    Active Member of [employer] or Organizations: Yes    Attends Engineer, structural: More than 4 times per year    Marital Status: Divorced  Intimate Partner Violence: Not At Risk (11/19/2021)   Humiliation, Afraid, Rape, and Kick questionnaire    Fear of Current or Ex-Partner: No    Emotionally Abused: No    Physically Abused: No    Sexually Abused: No    Family History: Family History  Problem Relation Age of Onset   Arthritis Mother    Cancer Mother        breast   Breast cancer Mother 12       recurrance at 36   COPD Father    Heart disease Father    Hyperlipidemia Father    Hypertension Father    Cancer Sister 77       anal, deceased due to mva but had advanced disease   Stroke Maternal Grandfather    Breast cancer Maternal Aunt 55    Current Medications:  Current Outpatient Medications:    ALPRAZolam (XANAX) 0.25 MG tablet, Take 1 tablet (0.25 mg total) by mouth at bedtime as needed for anxiety., Disp: 30 tablet, Rfl: 2   atorvastatin (LIPITOR) 10 MG tablet, Take 1 tablet (10 mg total) by  mouth daily., Disp: 90 tablet, Rfl: 0   CALCIUM PO, Take 2,400 mg by mouth daily. D3, Disp: , Rfl:    diazepam (VALIUM) 5 MG tablet, Take 1 tablet (5 mg total) by mouth at bedtime as needed. Max daily amount is 1 tablet. (Patient taking differently: Take 2.5-5 mg by mouth at bedtime as needed (Dizziness).), Disp: 30 tablet, Rfl: 5   ferrous sulfate 325 (65 FE) MG EC tablet, Take 325 mg by mouth daily with breakfast., Disp: , Rfl:    fluticasone (FLONASE) 50 MCG/ACT nasal spray, Place 1 spray into both nostrils daily as needed for allergies., Disp: , Rfl:    fluticasone (FLONASE) 50 MCG/ACT nasal spray, Place 2 sprays into both nostrils daily., Disp: 16 g, Rfl: 5   hydrochlorothiazide (HYDRODIURIL) 25 MG tablet, Take 1 tablet (25 mg total) by mouth in the morning., Disp: 90 tablet, Rfl: 2   levocetirizine (XYZAL) 5 MG tablet, Take 5 mg by mouth every evening., Disp: , Rfl:    meclizine (ANTIVERT) 25 MG tablet, Take 1 tablet (25 mg total) by mouth 3 (three) times daily as needed for dizziness., Disp: 30 tablet, Rfl: 0   olmesartan (BENICAR) 40 MG tablet, Take 1 tablet (40 mg total) by mouth daily., Disp: 90 tablet, Rfl: 1   omeprazole (PRILOSEC) 40 MG capsule, Take 1 capsule (40 mg total) by mouth daily., Disp: 90 capsule, Rfl: 3   polyethylene glycol (MIRALAX / GLYCOLAX) 17 g packet, Take 17 g by mouth at bedtime., Disp: , Rfl:  potassium chloride (KLOR-CON) 10 MEQ tablet, Take 1 tablet (10 mEq total) by mouth daily., Disp: 30 tablet, Rfl: 5   potassium chloride (KLOR-CON) 10 MEQ tablet, Take 1 tablet (10 mEq total) by mouth daily., Disp: 30 tablet, Rfl: 4   Probiotic Product (PROBIOTIC PO), Take 1 tablet by mouth daily., Disp: , Rfl:    tamoxifen (NOLVADEX) 20 MG tablet, Take 1 tablet (20 mg total) by mouth daily., Disp: 30 tablet, Rfl: 11   Allergies: Allergies  Allergen Reactions   Bee Venom Swelling and Other (See Comments)    Severe swelling    REVIEW OF SYSTEMS:   Review of Systems   Constitutional:  Negative for chills, fatigue and fever.  HENT:   Negative for lump/mass, mouth sores, nosebleeds, sore throat and trouble swallowing.   Eyes:  Negative for eye problems.  Respiratory:  Negative for cough and shortness of breath.   Cardiovascular:  Negative for chest pain, leg swelling and palpitations.  Gastrointestinal:  Negative for abdominal pain, constipation, diarrhea, nausea and vomiting.  Genitourinary:  Positive for nocturia. Negative for bladder incontinence, difficulty urinating, dysuria, frequency and hematuria.   Musculoskeletal:  Negative for arthralgias, back pain, flank pain, myalgias and neck pain.  Skin:  Negative for itching and rash.  Neurological:  Positive for dizziness (occasional) and numbness (tingling in feet). Negative for headaches.  Hematological:  Does not bruise/bleed easily.  Psychiatric/Behavioral:  Negative for depression, sleep disturbance and suicidal ideas. The patient is not nervous/anxious.   All other systems reviewed and are negative.    VITALS:   Blood pressure 129/76, pulse 72, temperature 98.7 F (37.1 C), temperature source Oral, height 5\' 7"  (1.702 m), weight 198 lb 3.2 oz (89.9 kg), last menstrual period 09/22/2018, SpO2 98 %.  Wt Readings from Last 3 Encounters:  06/26/22 198 lb 3.2 oz (89.9 kg)  02/19/22 195 lb 15.8 oz (88.9 kg)  02/11/22 195 lb (88.5 kg)    Body mass index is 31.04 kg/m.  Performance status (ECOG): 0 - Asymptomatic  PHYSICAL EXAM:   Physical Exam Vitals and nursing note reviewed. Exam conducted with a chaperone present.  Constitutional:      Appearance: Normal appearance.  Cardiovascular:     Rate and Rhythm: Normal rate and regular rhythm.     Pulses: Normal pulses.     Heart sounds: Normal heart sounds.  Pulmonary:     Effort: Pulmonary effort is normal.     Breath sounds: Normal breath sounds.  Abdominal:     Palpations: Abdomen is soft. There is no hepatomegaly, splenomegaly or mass.      Tenderness: There is no abdominal tenderness.  Musculoskeletal:     Right lower leg: No edema.     Left lower leg: No edema.  Lymphadenopathy:     Cervical: No cervical adenopathy.     Right cervical: No superficial, deep or posterior cervical adenopathy.    Left cervical: No superficial, deep or posterior cervical adenopathy.     Upper Body:     Right upper body: No supraclavicular or axillary adenopathy.     Left upper body: No supraclavicular or axillary adenopathy.  Neurological:     General: No focal deficit present.     Mental Status: She is alert and oriented to person, place, and time.  Psychiatric:        Mood and Affect: Mood normal.        Behavior: Behavior normal.     LABS:      Latest  Ref Rng & Units 06/19/2022    3:19 PM 02/04/2022   10:46 AM 12/19/2021    8:37 PM  CBC  WBC 4.0 - 10.5 K/uL 8.6  6.4  8.3   Hemoglobin 12.0 - 15.0 g/dL 84.6  96.2  95.2   Hematocrit 36.0 - 46.0 % 43.2  41.4  35.9   Platelets 150 - 400 K/uL 230  273  228       Latest Ref Rng & Units 06/19/2022    3:19 PM 02/04/2022   10:46 AM 12/19/2021    8:37 PM  CMP  Glucose 70 - 99 mg/dL 87  841  324   BUN 6 - 20 mg/dL Creatinine 0.44 - 1.00 mg/dL 4.01  0.27  2.53   Sodium 135 - 145 mmol/L 138  141  135   Potassium 3.5 - 5.1 mmol/L 3.4  4.0  2.6   Chloride 98 - 111 mmol/L 103  108  101   CO2 22 - 32 mmol/L Calcium 8.9 - 10.3 mg/dL 9.3  9.0  8.9   Total Protein 6.5 - 8.1 g/dL 6.7   6.4   Total Bilirubin 0.3 - 1.2 mg/dL 0.5   0.4   Alkaline Phos 38 - 126 U/L 61   50   AST 15 - 41 U/L 14   19   ALT 0 - 44 U/L 17   18      No results found for: "CEA1", "CEA" / No results found for: "CEA1", "CEA" No results found for: "PSA1" No results found for: "GUY403" No results found for: "CAN125"  No results found for: "TOTALPROTELP", "ALBUMINELP", "A1GS", "A2GS", "BETS", "BETA2SER", "GAMS", "MSPIKE", "SPEI" Lab Results  Component Value Date   TIBC 370 06/19/2022    TIBC 359 02/04/2022   TIBC 412 12/10/2021   FERRITIN 24 06/19/2022   FERRITIN 14 02/04/2022   FERRITIN 4 (L) 12/10/2021   IRONPCTSAT 22 06/19/2022   IRONPCTSAT 17 02/04/2022   IRONPCTSAT 10 (L) 12/10/2021   No results found for: "LDH"   STUDIES:   No results found.   MM 3D SCREEN BREAST BILATERAL Result Date: 05/21/2022 CLINICAL DATA:  Screening. EXAM: DIGITAL SCREENING BILATERAL MAMMOGRAM WITH TOMOSYNTHESIS AND CAD TECHNIQUE: Bilateral screening digital craniocaudal and mediolateral oblique mammograms were obtained. Bilateral screening digital breast tomosynthesis was performed. The images were evaluated with computer-aided detection. COMPARISON:  Previous exam(s). ACR Breast Density Category c: The breasts are heterogeneously dense, which may obscure small masses. FINDINGS: There are no findings suspicious for malignancy. IMPRESSION: No mammographic evidence of malignancy. A result letter of this screening mammogram will be mailed directly to the patient. RECOMMENDATION: Screening mammogram in one year. (Code:SM-B-01Y) BI-RADS CATEGORY  1: Negative. Electronically Signed   By: Amie Portland M.D.   On: 05/21/2022 16:17

## 2022-06-26 NOTE — Patient Instructions (Signed)
[facility]  Discharge Instructions  You were seen and examined today by Dr. Ellin Saba.  Dr. Ellin Saba discussed your most recent lab work which revealed that everything looks good and stable.  Follow-up as scheduled in 1 year after mammogram.    Thank you for choosing [facility] to provide your oncology and hematology care.   To afford each patient quality time with our provider, please arrive at least 15 minutes before your scheduled appointment time. You may need to reschedule your appointment if you arrive late (10 or more minutes). Arriving late affects you and other patients whose appointments are after yours.  Also, if you miss three or more appointments without notifying the office, you may be dismissed from the clinic at the provider's discretion.    Again, thank you for choosing [facility].  Our hope is that these requests will decrease the amount of time that you wait before being seen by our physicians.   If you have a lab appointment with the Cancer Center - please note that after April 8th, all labs will be drawn in the cancer center.  You do not have to check in or register with the main entrance as you have in the past but will complete your check-in at the cancer center.            _____________________________________________________________  Should you have questions after your visit to [facility], please contact our office at [phone] and follow the prompts.  Our office hours are 8:00 a.m. to 4:30 p.m. Monday - Thursday and 8:00 a.m. to 2:30 p.m. Friday.  Please note that voicemails left after 4:00 p.m. may not be returned until the following business day.  We are closed weekends and all major holidays.  You do have access to a nurse 24-7, just call the main number to the clinic [phone] and do not press any options, hold on the line and a nurse will answer the phone.    For  prescription refill requests, have your pharmacy contact our office and allow 72 hours.    Masks are no longer required in the cancer centers. If you would like for your care team to wear a mask while they are taking care of you, please let them know. You may have one support person who is at least 54 years old accompany you for your appointments.

## 2022-07-01 ENCOUNTER — Other Ambulatory Visit (HOSPITAL_COMMUNITY): Payer: Self-pay

## 2022-07-02 ENCOUNTER — Other Ambulatory Visit (HOSPITAL_COMMUNITY): Payer: Self-pay

## 2022-07-02 MED ORDER — OLMESARTAN MEDOXOMIL 40 MG PO TABS
40.0000 mg | ORAL_TABLET | Freq: Every day | ORAL | 1 refills | Status: DC
  Filled 2022-07-02 – 2022-09-18 (×3): qty 90, 90d supply, fill #0

## 2022-07-15 ENCOUNTER — Other Ambulatory Visit (HOSPITAL_COMMUNITY): Payer: Self-pay

## 2022-07-15 DIAGNOSIS — Z79899 Other long term (current) drug therapy: Secondary | ICD-10-CM | POA: Diagnosis not present

## 2022-07-15 DIAGNOSIS — J0100 Acute maxillary sinusitis, unspecified: Secondary | ICD-10-CM | POA: Diagnosis not present

## 2022-07-15 DIAGNOSIS — E876 Hypokalemia: Secondary | ICD-10-CM | POA: Diagnosis not present

## 2022-07-15 DIAGNOSIS — Z1331 Encounter for screening for depression: Secondary | ICD-10-CM | POA: Diagnosis not present

## 2022-07-15 DIAGNOSIS — F411 Generalized anxiety disorder: Secondary | ICD-10-CM | POA: Diagnosis not present

## 2022-07-15 MED ORDER — POTASSIUM CHLORIDE CRYS ER 10 MEQ PO TBCR
10.0000 meq | EXTENDED_RELEASE_TABLET | Freq: Every day | ORAL | 5 refills | Status: DC
  Filled 2022-07-15 – 2022-11-18 (×4): qty 30, 30d supply, fill #0
  Filled 2022-12-16: qty 30, 30d supply, fill #1
  Filled 2023-01-15: qty 30, 30d supply, fill #2
  Filled 2023-02-11: qty 30, 30d supply, fill #3
  Filled 2023-03-12: qty 30, 30d supply, fill #4
  Filled 2023-04-13: qty 30, 30d supply, fill #5

## 2022-07-15 MED ORDER — SERTRALINE HCL 25 MG PO TABS
ORAL_TABLET | ORAL | 0 refills | Status: DC
  Filled 2022-07-15: qty 30, 32d supply, fill #0

## 2022-07-15 MED ORDER — AMOXICILLIN-POT CLAVULANATE 875-125 MG PO TABS
1.0000 | ORAL_TABLET | Freq: Two times a day (BID) | ORAL | 0 refills | Status: DC
  Filled 2022-07-15: qty 20, 10d supply, fill #0

## 2022-07-16 ENCOUNTER — Other Ambulatory Visit (HOSPITAL_COMMUNITY): Payer: Self-pay

## 2022-07-16 ENCOUNTER — Other Ambulatory Visit: Payer: Self-pay

## 2022-07-16 MED ORDER — AMOXICILLIN-POT CLAVULANATE 875-125 MG PO TABS
1.0000 | ORAL_TABLET | Freq: Two times a day (BID) | ORAL | 0 refills | Status: DC
  Filled 2022-07-16: qty 20, 10d supply, fill #0

## 2022-07-16 MED ORDER — POTASSIUM CHLORIDE CRYS ER 10 MEQ PO TBCR
10.0000 meq | EXTENDED_RELEASE_TABLET | Freq: Every day | ORAL | 5 refills | Status: DC
  Filled 2022-07-16: qty 30, 30d supply, fill #0

## 2022-07-16 MED ORDER — SERTRALINE HCL 25 MG PO TABS: ORAL_TABLET | ORAL | 0 refills | Status: DC

## 2022-07-17 ENCOUNTER — Other Ambulatory Visit (HOSPITAL_COMMUNITY): Payer: Self-pay

## 2022-07-18 ENCOUNTER — Other Ambulatory Visit (HOSPITAL_COMMUNITY): Payer: Self-pay

## 2022-07-18 ENCOUNTER — Other Ambulatory Visit: Payer: Self-pay

## 2022-07-18 MED ORDER — ATORVASTATIN CALCIUM 10 MG PO TABS
10.0000 mg | ORAL_TABLET | Freq: Every day | ORAL | 1 refills | Status: DC
  Filled 2022-07-18: qty 90, 90d supply, fill #0
  Filled 2022-09-18 – 2022-09-30 (×2): qty 90, 90d supply, fill #1

## 2022-08-12 ENCOUNTER — Other Ambulatory Visit (HOSPITAL_COMMUNITY): Payer: Self-pay

## 2022-08-19 ENCOUNTER — Other Ambulatory Visit: Payer: Self-pay

## 2022-08-19 ENCOUNTER — Other Ambulatory Visit (HOSPITAL_COMMUNITY): Payer: Self-pay

## 2022-08-19 MED ORDER — FLUTICASONE PROPIONATE 50 MCG/ACT NA SUSP
2.0000 | Freq: Every day | NASAL | 5 refills | Status: DC
  Filled 2022-08-19: qty 16, 30d supply, fill #0
  Filled 2022-11-18 (×2): qty 16, 30d supply, fill #1
  Filled 2023-02-11: qty 16, 30d supply, fill #2

## 2022-08-20 ENCOUNTER — Other Ambulatory Visit (HOSPITAL_COMMUNITY): Payer: Self-pay

## 2022-08-20 MED ORDER — ONDANSETRON 4 MG PO TBDP
4.0000 mg | ORAL_TABLET | Freq: Three times a day (TID) | ORAL | 3 refills | Status: DC | PRN
  Filled 2022-08-20: qty 20, 7d supply, fill #0
  Filled 2022-12-16: qty 20, 7d supply, fill #1
  Filled 2022-12-30 – 2023-02-11 (×2): qty 20, 7d supply, fill #2

## 2022-09-18 ENCOUNTER — Other Ambulatory Visit (HOSPITAL_COMMUNITY): Payer: Self-pay

## 2022-09-19 ENCOUNTER — Other Ambulatory Visit: Payer: Self-pay

## 2022-10-16 ENCOUNTER — Other Ambulatory Visit (HOSPITAL_COMMUNITY): Payer: Self-pay

## 2022-11-04 ENCOUNTER — Telehealth: Payer: Self-pay

## 2022-11-04 ENCOUNTER — Other Ambulatory Visit (HOSPITAL_COMMUNITY): Payer: Self-pay

## 2022-11-04 ENCOUNTER — Telehealth: Payer: Self-pay | Admitting: Gastroenterology

## 2022-11-04 MED ORDER — OMEPRAZOLE 40 MG PO CPDR
40.0000 mg | DELAYED_RELEASE_CAPSULE | Freq: Two times a day (BID) | ORAL | 3 refills | Status: DC
  Filled 2022-11-04 – 2023-01-06 (×4): qty 60, 30d supply, fill #0
  Filled 2023-02-11: qty 60, 30d supply, fill #1
  Filled 2023-03-12: qty 60, 30d supply, fill #2
  Filled 2023-07-13: qty 60, 30d supply, fill #3

## 2022-11-04 NOTE — Telephone Encounter (Signed)
Spoke with the pt and was advised she is having issues with indigestion, she has appt for 9/24 but her Omeprazole will run out before then. Pt started taking 2 of them to help with her symptoms. Can you get her in sooner so she doesn't run out

## 2022-11-04 NOTE — Telephone Encounter (Signed)
error 

## 2022-11-04 NOTE — Addendum Note (Signed)
Addended by: Gelene Mink on: 11/04/2022 04:42 PM   Modules accepted: Orders

## 2022-11-04 NOTE — Telephone Encounter (Signed)
noted 

## 2022-11-04 NOTE — Telephone Encounter (Signed)
Patient made an appt and has a questions about a medication and would like to talk to the nurse about it.

## 2022-11-04 NOTE — Telephone Encounter (Signed)
I have sent in omeprazole to take BID. Please move up appointment to an opening I have available. Thanks!

## 2022-11-06 NOTE — Progress Notes (Signed)
Referring Provider: Roe Rutherford, NP Primary Care Physician:  Roe Rutherford, NP Primary GI Physician: Dr. Jena Gauss  Chief Complaint  Patient presents with   Gastroesophageal Reflux    Heart burn    HPI:   Lorraine Peters is a 52 y.o. female with GI history of GERD, multiple adenomatous colon polyps, abdominal pain, fatty liver, constipation, found to have iron deficiency anemia in October 2023, presenting today for follow-up of IDA and GERD s/p EGD and colonoscopy.  She was last seen in the office 01/09/2022 for iron deficiency anemia.  She denied overt GI bleeding.  Stool was dark on iron which she started October 9.  Denied any significant GI symptoms.  GERD well-controlled on PPI daily.  She was also taking MiraLAX daily.  She was scheduled for EGD and colonoscopy.  Colonoscopy 02/11/2022: Three 3-6 mm polyps in the descending colon resected and retrieved.  Pathology with tubular adenomas.  Recommended 5-year surveillance.  EGD 02/11/2022: Patulous EG junction, medium sized hiatal hernia, otherwise normal exam.  Telephone call 11/04/2022 with patient reporting having to take 2 omeprazole to keep GERD symptoms controlled.  Prescription was updated to twice daily and she was scheduled for an appointment to discuss further.    Today:  IDA: Most recent labs 06/19/2022 with hemoglobin 14.2, iron panel within normal limits. No brbpr or melena. No NSAIDs.   Developed generalized abdominal discomfort/full sensation, nausea starting last Tuesday.  Symptoms were intermittent, but daily with no aggravating or relieving factors.  Reports she did eat jambalaya prior to onset.  Had 1 episode of heartburn, but otherwise GERD has been well-controlled.  She never increased omeprazole to twice a day.  States she prefers not to increase the medication if she does not have to.  Symptoms have been gradually improving and today she is actually feeling well without any nausea or abdominal pain.  She never  vomited.  No change in bowel habits, fever, sick contacts.  Denies starting any new medications prior.  Reports she does have history of H. pylori, treated with antibiotics.  States this was diagnosed with [facility] last year.   Reviewed labs available in [facility].  Patient had positive H. pylori breath test in November 2021.  Negative H. pylori breath test January 2022.  Past Medical History:  Diagnosis Date   Breast cancer (HCC)    Breast disorder    left breast at age 62   Cigarette nicotine dependence, uncomplicated 02/21/2015   Family history of breast cancer    Family history of carcinoma in situ of anal canal 09/01/2017   Family history of colon cancer    H. pylori infection    History of kidney stones    Hypercholesterolemia    Hypertension    Migraine    Ovarian cyst    Personal history of radiation therapy    PONV (postoperative nausea and vomiting)    Reflux    right 05/23/2020   right    Past Surgical History:  Procedure Laterality Date   BREAST BIOPSY Left 04/2019   BREAST BIOPSY Right 05/23/2020   BREAST LUMPECTOMY Left 2020   COLONOSCOPY N/A 12/30/2018   six 4-7 mm polyps in rectum, descending colon, and ascending colon. Sigmoid and descending colon diverticulosis. Serrated and tubular adenoma. Surveillance 3 years.    COLONOSCOPY WITH PROPOFOL N/A 02/11/2022   Procedure: COLONOSCOPY WITH PROPOFOL;  Surgeon: Corbin Ade, MD;  [facility]: [facility];  Service: Endoscopy;  Laterality: N/A;  9:45am, asa 2  ESOPHAGOGASTRODUODENOSCOPY N/A 09/12/2017   normal esophagus, small hiatal hernia, large pedunculated duodenal polyp s/p resection and clip placement. Benign polyp   ESOPHAGOGASTRODUODENOSCOPY (EGD) WITH PROPOFOL N/A 02/11/2022   Procedure: ESOPHAGOGASTRODUODENOSCOPY (EGD) WITH PROPOFOL;  Surgeon: Corbin Ade, MD;  [facility]: [facility];  Service: Endoscopy;  Laterality: N/A;   PARTIAL MASTECTOMY WITH NEEDLE LOCALIZATION AND AXILLARY SENTINEL LYMPH NODE  BX Left 04/29/2018   Procedure: PARTIAL MASTECTOMY WITH NEEDLE LOCALIZATION AND AXILLARY SENTINEL LYMPH NODE BX;  Surgeon: Lucretia Roers, MD;  Location: [facility];  Service: General;  Laterality: Left;   POLYPECTOMY  12/30/2018   Procedure: POLYPECTOMY;  Surgeon: Corbin Ade, MD;  [facility]: [facility];  Service: Endoscopy;;  ascending,descending,rectal   POLYPECTOMY  02/11/2022   Procedure: POLYPECTOMY;  Surgeon: Corbin Ade, MD;  Location: [facility];  Service: Endoscopy;;   TUBAL LIGATION      Current Outpatient Medications  Medication Sig Dispense Refill   ALPRAZolam (XANAX) 0.25 MG tablet Take 1 tablet (0.25 mg total) by mouth at bedtime as needed for anxiety. 30 tablet 2   atorvastatin (LIPITOR) 10 MG tablet Take 1 tablet (10 mg total) by mouth daily. 90 tablet 1   CALCIUM PO Take 2,400 mg by mouth daily. D3     diazepam (VALIUM) 5 MG tablet Take 1 tablet (5 mg total) by mouth at bedtime as needed. Max daily amount is 1 tablet. (Patient taking differently: Take 2.5-5 mg by mouth at bedtime as needed (Dizziness).) 30 tablet 5   ferrous sulfate 325 (65 FE) MG EC tablet Take 325 mg by mouth daily with breakfast.     fluticasone (FLONASE) 50 MCG/ACT nasal spray Place 2 sprays into both nostrils daily. 16 g 5   hydrochlorothiazide (HYDRODIURIL) 25 MG tablet Take 1 tablet (25 mg total) by mouth in the morning. (Patient taking differently: Take 12.5 mg by mouth in the morning.) 90 tablet 2   meclizine (ANTIVERT) 25 MG tablet Take 1 tablet (25 mg total) by mouth 3 (three) times daily as needed for dizziness. 30 tablet 0   olmesartan (BENICAR) 40 MG tablet Take 1 tablet (40 mg total) by mouth daily. 90 tablet 1   omeprazole (PRILOSEC) 40 MG capsule Take 1 capsule (40 mg total) by mouth 2 (two) times daily before a meal. (Patient taking differently: Take 40 mg by mouth daily.) 60 capsule 3   ondansetron (ZOFRAN-ODT) 4 MG disintegrating tablet Take 1 tablet (4 mg total) by mouth every 8  (eight) hours as needed for nausea for up to 7 days. 20 tablet 3   polyethylene glycol (MIRALAX / GLYCOLAX) 17 g packet Take 17 g by mouth at bedtime.     potassium chloride (KLOR-CON M) 10 MEQ tablet Take 1 tablet (10 mEq total) by mouth daily. 30 tablet 5   Probiotic Product (PROBIOTIC PO) Take 1 tablet by mouth daily.     tamoxifen (NOLVADEX) 20 MG tablet Take 1 tablet (20 mg total) by mouth daily. 30 tablet 11   fluticasone (FLONASE) 50 MCG/ACT nasal spray Place 2 sprays into both nostrils daily. 16 g 5   levocetirizine (XYZAL) 5 MG tablet Take 5 mg by mouth every evening.     sertraline (ZOLOFT) 25 MG tablet Take 0.5 tablets (12.5 mg total) by mouth at bedtime for 7 days, THEN 1 tablet (25 mg total) at bedtime for 24 days. 30 tablet 0   sertraline (ZOLOFT) 25 MG tablet Take 0.5 tablets (12.5 mg total) by mouth at  bedtime for 7 days, THEN 1 tablet (25 mg total) by mouth at bedtime. 10.5 tablet 0   No current facility-administered medications for this visit.    Allergies as of 11/07/2022 - Review Complete 11/07/2022  Allergen Reaction Noted   Bee venom Swelling and Other (See Comments) 04/29/2011    Family History  Problem Relation Age of Onset   Arthritis Mother    Cancer Mother        breast   Breast cancer Mother 1       recurrance at 64   COPD Father    Heart disease Father    Hyperlipidemia Father    Hypertension Father    Cancer Sister 42       anal, deceased due to mva but had advanced disease   Stroke Maternal Grandfather    Breast cancer Maternal Aunt 36    Social History   Socioeconomic History   Marital status: Divorced    Spouse name: Not on file   Number of children: 1   Years of education: Not on file   Highest education level: Not on file  Occupational History   Not on file  Tobacco Use   Smoking status: Former    Current packs/day: 0.00    Average packs/day: 0.5 packs/day for 28.0 years (14.0 ttl pk-yrs)    Types: Cigarettes    Start date:  04/22/1990    Quit date: 04/22/2018    Years since quitting: 4.5   Smokeless tobacco: Never  Vaping Use   Vaping status: Never Used  Substance and Sexual Activity   Alcohol use: No    Alcohol/week: 0.0 standard drinks of alcohol   Drug use: No   Sexual activity: Not Currently    Birth control/protection: Surgical    Comment: tubal  Other Topics Concern   Not on file  Social History Narrative   Not on file   Social Determinants of Health   Financial Resource Strain: Low Risk  (07/15/2022)   Received from [facility]   Overall Financial Resource Strain (CARDIA)    Difficulty of Paying Living Expenses: Not hard at all  Food Insecurity: No Food Insecurity (07/15/2022)   [facility]   Hunger Vital Sign    Worried About Running Out of Food in the Last Year: Never true    Ran Out of Food in the Last Year: Never true  Transportation Needs: No Transportation Needs (07/15/2022)   Received from [facility] - Transportation    Lack of Transportation (Medical): No    Lack of Transportation (Non-Medical): No  Physical Activity: Insufficiently Active (11/19/2021)   Exercise Vital Sign    Days of Exercise per Week: 2 days    Minutes of Exercise per Session: 30 min  Stress: No Stress Concern Present (11/19/2021)   [employer] of Occupational Health - Occupational Stress Questionnaire    Feeling of Stress : Only a little  Social Connections: Moderately Integrated (11/19/2021)   Social Connection and Isolation Panel [NHANES]    Frequency of Communication with Friends and Family: More than three times a week    Frequency of Social Gatherings with Friends and Family: Once a week    Attends Religious Services: More than 4 times per year    Active Member of [employer] or Organizations: Yes    Attends Engineer, structural: More than 4 times per year    Marital Status: Divorced    Review of Systems: Gen: Denies fever, chills,  cold or flulike symptoms,  presyncope, syncope. CV: Denies chest pain, palpitations. Resp: Denies dyspnea, cough. GI: See HPI Heme: See HPI  Physical Exam: BP 121/81 (BP Location: Right Arm, Patient Position: Sitting, Cuff Size: Normal)   Pulse 76   Temp 98.6 F (37 C) (Temporal)   Ht 5\' 7"  (1.702 m)   Wt 196 lb (88.9 kg)   LMP 09/22/2018 (Approximate) Comment:  on tamoxifen no period over a year bleedi 11/14 like a period   SpO2 98%   BMI 30.70 kg/m  General:   Alert and oriented. No distress noted. Pleasant and cooperative.  Head:  Normocephalic and atraumatic. Eyes:  Conjuctiva clear without scleral icterus. Heart:  S1, S2 present without murmurs appreciated. Lungs:  Clear to auscultation bilaterally. No wheezes, rales, or rhonchi. No distress.  Abdomen:  +BS, soft, non-tender and non-distended. No rebound or guarding. No HSM or masses noted. Msk:  Symmetrical without gross deformities. Normal posture. Extremities:  Without edema. Neurologic:  Alert and  oriented x4 Psych:  Normal mood and affect.    Assessment:  52 y.o. female with GI history of GERD, multiple adenomatous colon polyps, abdominal pain, fatty liver, constipation, found to have iron deficiency anemia in October 2023, presenting today for follow-up of IDA and GERD s/p EGD and colonoscopy.   IDA: Underwent EGD and colonoscopy 02/11/2022.  She was found to have 3 tubular adenomas that were removed from her colon with recommendations for surveillance in 5 years.  EGD with patulous GE junction, medium sized hiatal hernia, otherwise normal exam.  She denies overt GI bleeding.  No NSAID use.  She has been on oral iron since December 10, 2021.  Most recent hemoglobin normalized to 14.2 in April.  Iron panel was within normal limits.  Etiology of IDA is not clear.  Recommended completing GI evaluation with givens capsule.  Notably, she tells me today she had H. pylori last year treated with antibiotics.  Per chart review, patient had positive H. pylori  breath test in November 2021, but negative breath test in January 2022.  Nonetheless, would recommend repeating H. pylori breath test in the future considering her reported H. pylori last year.  We will hold off on this for now as she recently experienced some abdominal discomfort and I would like to continue PPI without interruption for now.   GERD: Typical reflux well-controlled on omeprazole 40 mg daily.   Generalized abdominal pain/nausea: Experienced intermittent generalized abdominal pain/full sensation with nausea over the last week, but symptoms have been slowly resolving and today she is feeling well.  She had no alarm symptoms.  Denies NSAIDs.  Etiology is unclear.  May have had a mild viral illness versus atypical GERD flare. EGD December 2023 with no significant abnormalities.  Gallbladder in situ.  Recommend monitoring for recurrent symptoms for now.   Plan:  Given's Capsule Continue omeprazole 40 mg daily.  Reinforced GERD diet/lifestyle. Written instructions provided on AVS.  Monitor for recurrent abdominal pain/nausea.  Follow-up TBD following capsule results.    Ermalinda Memos, PA-C [facility] 11/07/2022

## 2022-11-07 ENCOUNTER — Encounter: Payer: Self-pay | Admitting: Gastroenterology

## 2022-11-07 ENCOUNTER — Ambulatory Visit: Payer: 59 | Admitting: Gastroenterology

## 2022-11-07 VITALS — BP 121/81 | HR 76 | Temp 98.6°F | Ht 67.0 in | Wt 196.0 lb

## 2022-11-07 DIAGNOSIS — R1084 Generalized abdominal pain: Secondary | ICD-10-CM | POA: Diagnosis not present

## 2022-11-07 DIAGNOSIS — D509 Iron deficiency anemia, unspecified: Secondary | ICD-10-CM | POA: Diagnosis not present

## 2022-11-07 DIAGNOSIS — K219 Gastro-esophageal reflux disease without esophagitis: Secondary | ICD-10-CM

## 2022-11-07 DIAGNOSIS — R110 Nausea: Secondary | ICD-10-CM | POA: Diagnosis not present

## 2022-11-07 NOTE — Patient Instructions (Signed)
We will arrange to have a givens capsule study to evaluate your small bowel to complete GI evaluation of iron deficiency anemia.  Continue taking omeprazole 40 mg daily for now.  Monitor for recurrent nausea or abdominal pain over the weekend.  If your symptoms return, please let me know and we will have you increase omeprazole to twice daily and consider some additional workup for this.  Follow a GERD diet:  Avoid fried, fatty, greasy, spicy, citrus foods. Avoid caffeine and carbonated beverages. Avoid chocolate. Try eating 4-6 small meals a day rather than 3 large meals. Do not eat within 3 hours of laying down.   It was nice to meet you today!  Ermalinda Memos, PA-C [facility]

## 2022-11-08 ENCOUNTER — Encounter: Payer: Self-pay | Admitting: *Deleted

## 2022-11-08 ENCOUNTER — Encounter: Payer: Self-pay | Admitting: Gastroenterology

## 2022-11-08 ENCOUNTER — Telehealth: Payer: Self-pay | Admitting: *Deleted

## 2022-11-08 NOTE — Telephone Encounter (Signed)
LMTRC  Givens Capsule  Hold Iron x 10 days.

## 2022-11-08 NOTE — Telephone Encounter (Signed)
Pt has been scheduled for 11/29/22. Instructions sent via MyChart.

## 2022-11-18 ENCOUNTER — Other Ambulatory Visit (HOSPITAL_COMMUNITY): Payer: Self-pay

## 2022-11-19 ENCOUNTER — Other Ambulatory Visit (HOSPITAL_COMMUNITY): Payer: Self-pay

## 2022-11-19 ENCOUNTER — Other Ambulatory Visit: Payer: Self-pay

## 2022-11-21 ENCOUNTER — Other Ambulatory Visit (HOSPITAL_COMMUNITY)
Admission: RE | Admit: 2022-11-21 | Discharge: 2022-11-21 | Disposition: A | Discharge status: Home / Self Care | Payer: 59 | Source: Ambulatory Visit | Attending: Obstetrics & Gynecology | Admitting: Obstetrics & Gynecology

## 2022-11-21 ENCOUNTER — Ambulatory Visit (INDEPENDENT_AMBULATORY_CARE_PROVIDER_SITE_OTHER): Payer: 59 | Admitting: Obstetrics & Gynecology

## 2022-11-21 ENCOUNTER — Encounter: Payer: Self-pay | Admitting: Obstetrics & Gynecology

## 2022-11-21 VITALS — BP 136/82 | HR 94 | Ht 67.0 in | Wt 193.8 lb

## 2022-11-21 DIAGNOSIS — Z1272 Encounter for screening for malignant neoplasm of vagina: Secondary | ICD-10-CM

## 2022-11-21 DIAGNOSIS — Z01419 Encounter for gynecological examination (general) (routine) without abnormal findings: Secondary | ICD-10-CM

## 2022-11-21 NOTE — Progress Notes (Signed)
WELL-WOMAN EXAMINATION Patient name: Lorraine Peters MRN [id number]  Date of birth: 13-Jun-1970 Chief Complaint:   Gynecologic Exam  History of Present Illness:   Lorraine Peters is a 52 y.o. G1P0101 PM with h/o breast Ca female being seen today for a routine well-woman exam.   Today she notes no acute complaints or concerns.  Denies vaginal bleeding, discharge, itching or irritation.  Denies pelvic or abdominal pain.  Not sexually active  Of note next year patient will stop tamoxifen as it has been 5 years   Patient's last menstrual period was 09/22/2018 (approximate). Denies issues with her menses The current method of family planning is post menopausal status.    Last pap collected today.  Last mammogram: 2024. Last colonoscopy: 2023     11/21/2022    9:45 AM 11/19/2021    9:38 AM 10/26/2020   11:12 AM 09/28/2019    8:40 AM 05/05/2019    4:15 PM  Depression screen PHQ 2/9  Decreased Interest 0 0 0 0 0  Down, Depressed, Hopeless 0 0 0 0 0  PHQ - 2 Score 0 0 0 0 0  Altered sleeping 1 1 0 0   Tired, decreased energy 0 1 0 0   Change in appetite 0 1 0 0   Feeling bad or failure about yourself  0 0 0 0   Trouble concentrating 0 0 0 0   Moving slowly or fidgety/restless 0 0 0 0   Suicidal thoughts 0 0 0 0   PHQ-9 Score 1 3 0 0       Review of Systems:   Pertinent items are noted in HPI Denies any headaches, blurred vision, fatigue, shortness of breath, chest pain, abdominal pain, bowel movements, urination, or intercourse unless otherwise stated above.  Pertinent History Reviewed:  Reviewed past medical,surgical, social and family history.  Reviewed problem list, medications and allergies. Physical Assessment:   Vitals:   11/21/22 0946  BP: 136/82  Pulse: 94  Weight: 193 lb 12.8 oz (87.9 kg)  Height: 5\' 7"  (1.702 m)  Body mass index is 30.35 kg/m.        Physical Examination:   General appearance - well appearing, and in no distress  Mental status - alert, oriented  to person, place, and time  Psych:  She has a normal mood and affect  Skin - warm and dry, normal color, no suspicious lesions noted  Chest - effort normal, all lung fields clear to auscultation bilaterally  Heart - normal rate and regular rhythm  Neck:  midline trachea, no thyromegaly or nodules  Breasts - breasts appear normal, dense tissue noted of left breast- no acute abnormality appreciated, no skin or nipple changes or  axillary nodes  Abdomen - soft, nontender, nondistended, no masses or organomegaly  Pelvic - VULVA: normal appearing vulva with no masses, tenderness or lesions  VAGINA: normal appearing vagina with normal color and discharge, no lesions  CERVIX: normal appearing cervix without discharge or lesions, no CMT  Thin prep pap is done with HR HPV cotesting  UTERUS: uterus is felt to be normal size, shape, consistency and nontender   ADNEXA: No adnexal masses or tenderness noted.  Extremities:  No swelling or varicosities noted  Chaperone: Lorraine Peters     Assessment & Plan:  1) Well-Woman Exam -pap collected, mam up to date -colonoscopy up to date  No orders of the defined types were placed in this encounter.   Meds: No orders of the  defined types were placed in this encounter.   Follow-up: Return in about 1 year (around 11/21/2023) for Annual.   Lorraine Hidalgo, DO Attending Obstetrician & Gynecologist, [facility]

## 2022-11-22 LAB — CYTOLOGY - PAP
Comment: NEGATIVE
Diagnosis: NEGATIVE
High risk HPV: NEGATIVE

## 2022-11-29 ENCOUNTER — Encounter (HOSPITAL_COMMUNITY)
Admission: RE | Disposition: A | Discharge status: Home / Self Care | Payer: Self-pay | Source: Home / Self Care | Attending: Internal Medicine

## 2022-11-29 ENCOUNTER — Ambulatory Visit (HOSPITAL_COMMUNITY)
Admission: RE | Admit: 2022-11-29 | Discharge: 2022-11-29 | Disposition: A | Discharge status: Home / Self Care | Payer: 59 | Attending: Internal Medicine | Admitting: Internal Medicine

## 2022-11-29 DIAGNOSIS — K633 Ulcer of intestine: Secondary | ICD-10-CM

## 2022-11-29 DIAGNOSIS — D509 Iron deficiency anemia, unspecified: Secondary | ICD-10-CM | POA: Diagnosis not present

## 2022-11-29 HISTORY — PX: GIVENS CAPSULE STUDY: SHX5432

## 2022-11-29 SURGERY — IMAGING PROCEDURE, GI TRACT, INTRALUMINAL, VIA CAPSULE

## 2022-12-02 ENCOUNTER — Encounter (HOSPITAL_COMMUNITY): Payer: Self-pay | Admitting: Internal Medicine

## 2022-12-03 ENCOUNTER — Ambulatory Visit: Payer: 59 | Admitting: Gastroenterology

## 2022-12-03 ENCOUNTER — Encounter: Payer: Self-pay | Admitting: *Deleted

## 2022-12-08 ENCOUNTER — Telehealth: Payer: Self-pay | Admitting: Gastroenterology

## 2022-12-08 NOTE — Op Note (Signed)
Small Bowel Givens Capsule Study Procedure date:  11/29/22  Referring Provider:  Ermalinda Memos, PA-C PCP:  Dr. Roe Rutherford, NP  Indication for procedure:  IDA. Prior EGD and colonoscopy 02/11/2022. At that time she was found to have 3 tubular adenomas that were removed from her colon with recommendations for surveillance in 5 years. EGD with patulous GE junction, medium sized hiatal hernia, otherwise normal exam.   Patient data:  Wt: 87.9 kg Ht: 5\' 7"    Findings:  Normal gastric mucosa. Scattered areas of small bowel erosions, more concentrated in the proximal small bowel though there are a few in the distal small bowel. Few areas of isolated tiny flecks of red blood in the distal small bowel, but no areas of active GI bleeding noted. Lymphangiectasias in proximal small bowel and single lymphangiectasia at 00:10:01. Possible small tiny polypoid tissue vs small bowel fold at 02:00:38. No masses appreciated. Study was complete to the cecum.    First Gastric image:  00:00:33 First Duodenal image: 00:05:50 First Cecal image: 02:14:23  Gastric Passage time: 0h 35m Small Bowel Passage time:  2h 54m  Summary: - Normal gastric mucosa.  - Small bowel erosions.  - Few isolated tiny flecks of red blood in distal small bowel without evidence of active bleeding.  - Small bowel lymphangiectasias.  - Small polyp in distal small bowel vs mucosal fold.    Recommendations: - Update CBC to ensure Hgb has remained stable. Hgb previously normal in April 2024.  - Continue iron daily.  - Continue omeprazole daily - Strict NSAID avoidance.  - Follow-up in office in 6 months or sooner if needed.     Ermalinda Memos, PA-C [facility]   Images:

## 2022-12-08 NOTE — Telephone Encounter (Signed)
Please let patient know the following Capsule results and recommendations.   Findings: - Normal gastric mucosa.  - Small bowel erosions.  - Few isolated tiny flecks of red blood in distal small bowel without evidence of active bleeding.  - Small polyp in distal small bowel vs mucosal fold. I will have Dr. Marletta Lor take a look at this to verify.   Overall, study is reassuring. Noting that we need to do any additional work-up for.    Recommendations: - Update CBC to ensure Hgb has remained stable. Toni Amend, please arrange)   - Continue iron daily.  - Continue omeprazole daily - Strict NSAID avoidance and these medications can cause erosions /inflammation in the GI tract. .  - Follow-up in office in 6 months or sooner if needed.     Darl Pikes: Please arrange 6 month follow-up.

## 2022-12-09 ENCOUNTER — Other Ambulatory Visit: Payer: Self-pay | Admitting: Gastroenterology

## 2022-12-09 ENCOUNTER — Encounter: Payer: Self-pay | Admitting: *Deleted

## 2022-12-09 DIAGNOSIS — Z8619 Personal history of other infectious and parasitic diseases: Secondary | ICD-10-CM

## 2022-12-09 NOTE — Telephone Encounter (Signed)
Sent pt a Wellsite geologist. Pt responded to [employer].

## 2022-12-10 NOTE — Telephone Encounter (Signed)
On recall  °

## 2022-12-16 ENCOUNTER — Other Ambulatory Visit: Payer: Self-pay | Admitting: *Deleted

## 2022-12-16 ENCOUNTER — Other Ambulatory Visit (HOSPITAL_COMMUNITY): Payer: Self-pay

## 2022-12-16 ENCOUNTER — Other Ambulatory Visit: Payer: Self-pay

## 2022-12-16 ENCOUNTER — Other Ambulatory Visit: Payer: Self-pay | Admitting: Hematology

## 2022-12-16 DIAGNOSIS — D0512 Intraductal carcinoma in situ of left breast: Secondary | ICD-10-CM

## 2022-12-16 MED ORDER — TAMOXIFEN CITRATE 20 MG PO TABS
20.0000 mg | ORAL_TABLET | Freq: Every day | ORAL | 11 refills | Status: DC
  Filled 2022-12-16: qty 30, 30d supply, fill #0
  Filled 2023-01-15: qty 30, 30d supply, fill #1
  Filled 2023-02-11: qty 30, 30d supply, fill #2
  Filled 2023-03-12: qty 30, 30d supply, fill #3
  Filled 2023-04-13: qty 30, 30d supply, fill #4
  Filled 2023-05-11: qty 30, 30d supply, fill #5

## 2022-12-16 NOTE — Telephone Encounter (Signed)
Tamoxifen refill approved.  Patient is tolerating and is to continue therapy. 

## 2022-12-17 ENCOUNTER — Other Ambulatory Visit: Payer: Self-pay

## 2022-12-24 ENCOUNTER — Other Ambulatory Visit: Payer: Self-pay | Admitting: *Deleted

## 2022-12-24 DIAGNOSIS — D649 Anemia, unspecified: Secondary | ICD-10-CM

## 2022-12-24 DIAGNOSIS — Z8619 Personal history of other infectious and parasitic diseases: Secondary | ICD-10-CM | POA: Diagnosis not present

## 2022-12-25 DIAGNOSIS — D649 Anemia, unspecified: Secondary | ICD-10-CM | POA: Diagnosis not present

## 2022-12-25 LAB — H. PYLORI BREATH TEST: H. pylori Breath Test: NOT DETECTED

## 2022-12-26 LAB — CBC WITH DIFFERENTIAL/PLATELET
Absolute Lymphocytes: 3282 {cells}/uL (ref 850–3900)
Absolute Monocytes: 686 {cells}/uL (ref 200–950)
Basophils Absolute: 26 {cells}/uL (ref 0–200)
Basophils Relative: 0.3 %
Eosinophils Absolute: 150 {cells}/uL (ref 15–500)
Eosinophils Relative: 1.7 %
HCT: 40.5 % (ref 35.0–45.0)
Hemoglobin: 13.5 g/dL (ref 11.7–15.5)
MCH: 29.9 pg (ref 27.0–33.0)
MCHC: 33.3 g/dL (ref 32.0–36.0)
MCV: 89.6 fL (ref 80.0–100.0)
MPV: 11.7 fL (ref 7.5–12.5)
Monocytes Relative: 7.8 %
Neutro Abs: 4655 {cells}/uL (ref 1500–7800)
Neutrophils Relative %: 52.9 %
Platelets: 232 10*3/uL (ref 140–400)
RBC: 4.52 10*6/uL (ref 3.80–5.10)
RDW: 12.4 % (ref 11.0–15.0)
Total Lymphocyte: 37.3 %
WBC: 8.8 10*3/uL (ref 3.8–10.8)

## 2022-12-30 ENCOUNTER — Other Ambulatory Visit: Payer: Self-pay | Admitting: Gastroenterology

## 2022-12-30 ENCOUNTER — Other Ambulatory Visit (HOSPITAL_COMMUNITY): Payer: Self-pay

## 2022-12-30 MED ORDER — ATORVASTATIN CALCIUM 10 MG PO TABS
10.0000 mg | ORAL_TABLET | Freq: Every day | ORAL | 1 refills | Status: DC
  Filled 2022-12-30: qty 90, 90d supply, fill #0
  Filled 2023-04-13: qty 90, 90d supply, fill #1

## 2022-12-30 MED ORDER — OLMESARTAN MEDOXOMIL 40 MG PO TABS
40.0000 mg | ORAL_TABLET | Freq: Every day | ORAL | 1 refills | Status: DC
  Filled 2022-12-30: qty 90, 90d supply, fill #0
  Filled 2023-03-12: qty 90, 90d supply, fill #1

## 2022-12-31 ENCOUNTER — Other Ambulatory Visit (HOSPITAL_COMMUNITY): Payer: Self-pay

## 2023-01-06 ENCOUNTER — Other Ambulatory Visit (HOSPITAL_COMMUNITY): Payer: Self-pay

## 2023-01-07 ENCOUNTER — Other Ambulatory Visit (HOSPITAL_COMMUNITY): Payer: Self-pay

## 2023-01-14 DIAGNOSIS — R7303 Prediabetes: Secondary | ICD-10-CM | POA: Diagnosis not present

## 2023-01-14 DIAGNOSIS — I10 Essential (primary) hypertension: Secondary | ICD-10-CM | POA: Diagnosis not present

## 2023-01-14 DIAGNOSIS — E782 Mixed hyperlipidemia: Secondary | ICD-10-CM | POA: Diagnosis not present

## 2023-01-15 ENCOUNTER — Other Ambulatory Visit (HOSPITAL_COMMUNITY): Payer: Self-pay

## 2023-01-16 ENCOUNTER — Other Ambulatory Visit: Payer: Self-pay

## 2023-01-28 ENCOUNTER — Other Ambulatory Visit (HOSPITAL_COMMUNITY): Payer: Self-pay

## 2023-02-11 ENCOUNTER — Other Ambulatory Visit (HOSPITAL_COMMUNITY): Payer: Self-pay

## 2023-03-13 ENCOUNTER — Other Ambulatory Visit (HOSPITAL_COMMUNITY): Payer: Self-pay

## 2023-03-25 ENCOUNTER — Other Ambulatory Visit: Payer: Self-pay | Admitting: *Deleted

## 2023-03-25 DIAGNOSIS — D0512 Intraductal carcinoma in situ of left breast: Secondary | ICD-10-CM

## 2023-04-14 ENCOUNTER — Other Ambulatory Visit: Payer: Self-pay | Admitting: *Deleted

## 2023-04-14 ENCOUNTER — Other Ambulatory Visit (HOSPITAL_COMMUNITY): Payer: Self-pay

## 2023-04-14 DIAGNOSIS — B9689 Other specified bacterial agents as the cause of diseases classified elsewhere: Secondary | ICD-10-CM | POA: Diagnosis not present

## 2023-04-14 DIAGNOSIS — J019 Acute sinusitis, unspecified: Secondary | ICD-10-CM | POA: Diagnosis not present

## 2023-04-14 DIAGNOSIS — E785 Hyperlipidemia, unspecified: Secondary | ICD-10-CM

## 2023-04-14 DIAGNOSIS — E049 Nontoxic goiter, unspecified: Secondary | ICD-10-CM

## 2023-04-14 DIAGNOSIS — E1165 Type 2 diabetes mellitus with hyperglycemia: Secondary | ICD-10-CM

## 2023-04-14 DIAGNOSIS — E782 Mixed hyperlipidemia: Secondary | ICD-10-CM

## 2023-04-14 DIAGNOSIS — R7303 Prediabetes: Secondary | ICD-10-CM

## 2023-04-25 ENCOUNTER — Ambulatory Visit (INDEPENDENT_AMBULATORY_CARE_PROVIDER_SITE_OTHER): Payer: 59 | Admitting: Obstetrics & Gynecology

## 2023-04-25 ENCOUNTER — Other Ambulatory Visit (HOSPITAL_COMMUNITY)
Admission: RE | Admit: 2023-04-25 | Discharge: 2023-04-25 | Disposition: A | Discharge status: Home / Self Care | Payer: 59 | Source: Ambulatory Visit | Attending: Obstetrics & Gynecology | Admitting: Obstetrics & Gynecology

## 2023-04-25 ENCOUNTER — Encounter: Payer: Self-pay | Admitting: Obstetrics & Gynecology

## 2023-04-25 VITALS — BP 144/84 | HR 76

## 2023-04-25 DIAGNOSIS — N898 Other specified noninflammatory disorders of vagina: Secondary | ICD-10-CM | POA: Insufficient documentation

## 2023-04-25 DIAGNOSIS — R3989 Other symptoms and signs involving the genitourinary system: Secondary | ICD-10-CM

## 2023-04-25 DIAGNOSIS — R3915 Urgency of urination: Secondary | ICD-10-CM

## 2023-04-25 NOTE — Progress Notes (Signed)
GYN VISIT Patient name: Lorraine Peters MRN [id number]  Date of birth: 03/12/1970 Chief Complaint:   Urinary Tract Infection  History of Present Illness:   Lorraine Peters is a 53 y.o. G1P0101 PM with h/o breast ca on Tamoxifen female being seen today for the following concerns:  Urinary symptoms: Started feel urinary pressure and urgency.  Recently on Amox for sinus infection- just completed yesterday (treated x 10 days).  It did seem to get better during that time, but seems like it has returned.  Notes complaining of a "pinching" and stabbing spasm in her vagina.  Denies vaginal discharge, itch or odor.  Some cloudiness in her urine in the am.  Notes some hot flashes, no fever or chills.  No nausea/vomiting.  This started last Tuesday and then returned yesterday.  Patient's last menstrual period was 09/22/2018 (approximate).  Review of Systems:   Pertinent items are noted in HPI Denies fever/chills, dizziness, headaches, visual disturbances, fatigue, shortness of breath, chest pain, abdominal pain, vomiting Pertinent History Reviewed:   Past Surgical History:  Procedure Laterality Date   BREAST BIOPSY Left 04/2019   BREAST BIOPSY Right 05/23/2020   BREAST LUMPECTOMY Left 2020   COLONOSCOPY N/A 12/30/2018   six 4-7 mm polyps in rectum, descending colon, and ascending colon. Sigmoid and descending colon diverticulosis. Serrated and tubular adenoma. Surveillance 3 years.    COLONOSCOPY WITH PROPOFOL N/A 02/11/2022   Procedure: COLONOSCOPY WITH PROPOFOL;  Surgeon: Corbin Ade, MD;  [facility];  Service: Endoscopy;  Laterality: N/A;  9:45am, asa 2   ESOPHAGOGASTRODUODENOSCOPY N/A 09/12/2017   normal esophagus, small hiatal hernia, large pedunculated duodenal polyp s/p resection and clip placement. Benign polyp   ESOPHAGOGASTRODUODENOSCOPY (EGD) WITH PROPOFOL N/A 02/11/2022   Procedure: ESOPHAGOGASTRODUODENOSCOPY (EGD) WITH PROPOFOL;  Surgeon: Corbin Ade, MD;   [facility];  Service: Endoscopy;  Laterality: N/A;   GIVENS CAPSULE STUDY N/A 11/29/2022   Procedure: GIVENS CAPSULE STUDY;  Surgeon: Lanelle Bal, DO;  [facility];  Service: Endoscopy;  Laterality: N/A;  7:30 am   PARTIAL MASTECTOMY WITH NEEDLE LOCALIZATION AND AXILLARY SENTINEL LYMPH NODE BX Left 04/29/2018   Procedure: PARTIAL MASTECTOMY WITH NEEDLE LOCALIZATION AND AXILLARY SENTINEL LYMPH NODE BX;  Surgeon: Lucretia Roers, MD;  Location: [facility];  Service: General;  Laterality: Left;   POLYPECTOMY  12/30/2018   Procedure: POLYPECTOMY;  Surgeon: Corbin Ade, MD;  [facility];  Service: Endoscopy;;  ascending,descending,rectal   POLYPECTOMY  02/11/2022   Procedure: POLYPECTOMY;  Surgeon: Corbin Ade, MD;  [facility];  Service: Endoscopy;;   TUBAL LIGATION      Past Medical History:  Diagnosis Date   Breast cancer (HCC)    Breast disorder    left breast at age 5   Cigarette nicotine dependence, uncomplicated 02/21/2015   Family history of breast cancer    Family history of carcinoma in situ of anal canal 09/01/2017   Family history of colon cancer    H. pylori infection    History of kidney stones    Hypercholesterolemia    Hypertension    Migraine    Ovarian cyst    Personal history of radiation therapy    PONV (postoperative nausea and vomiting)    Reflux    right 05/23/2020   right   Reviewed problem list, medications and allergies. Physical Assessment:   Vitals:   04/25/23 1010 04/25/23 1026  BP: (!) 140/83 Marland Kitchen)  144/84  Pulse: 87 76  There is no height or weight on file to calculate BMI.       Physical Examination:   General appearance: alert, well appearing, and in no distress  Psych: mood appropriate, normal affect  Skin: warm & dry   Cardiovascular: normal heart rate noted  Respiratory: normal respiratory effort, no distress  Abdomen: soft, non-tender, no rebound, no guarding  Pelvic: VULVA:  normal appearing vulva with no masses, tenderness or lesions, VAGINA: normal clear to white discharge, no lesions, CERVIX: normal appearing cervix without discharge or lesions  Extremities: no edema   Chaperone: Faith Rogue    Assessment & Plan:  1) Suspected UTI -UA and culture sent -will also r/o underlying vaginitis infection -further management pending results  []  briefly discussed avoidance of certain foods as this may exacerbate her symptoms   Orders Placed This Encounter  Procedures   Urine Culture   Urinalysis    Return if symptoms worsen or fail to improve, for annual due in Sept 2025.   Myna Hidalgo, DO Attending Obstetrician & Gynecologist, [facility] for [employer], [facility]

## 2023-04-26 ENCOUNTER — Encounter: Payer: Self-pay | Admitting: Obstetrics & Gynecology

## 2023-04-26 LAB — URINALYSIS
Bilirubin, UA: NEGATIVE
Glucose, UA: NEGATIVE
Ketones, UA: NEGATIVE
Nitrite, UA: NEGATIVE
Protein,UA: NEGATIVE
RBC, UA: NEGATIVE
Specific Gravity, UA: 1.015 (ref 1.005–1.030)
Urobilinogen, Ur: 0.2 mg/dL (ref 0.2–1.0)
pH, UA: 7 (ref 5.0–7.5)

## 2023-04-27 LAB — URINE CULTURE

## 2023-04-28 ENCOUNTER — Other Ambulatory Visit: Payer: Self-pay | Admitting: Obstetrics & Gynecology

## 2023-04-28 DIAGNOSIS — N760 Acute vaginitis: Secondary | ICD-10-CM

## 2023-04-28 LAB — CERVICOVAGINAL ANCILLARY ONLY
Bacterial Vaginitis (gardnerella): NEGATIVE
Candida Glabrata: POSITIVE — AB
Candida Vaginitis: NEGATIVE
Comment: NEGATIVE
Comment: NEGATIVE
Comment: NEGATIVE

## 2023-04-28 MED ORDER — BORIC ACID VAGINAL 600 MG VA SUPP: VAGINAL | 0 refills | Status: DC

## 2023-04-28 NOTE — Progress Notes (Signed)
 Rx for yeast sent in  Lorraine Hidalgo, DO Attending Obstetrician & Gynecologist, [facility] for [facility], [facility]

## 2023-05-01 ENCOUNTER — Other Ambulatory Visit (HOSPITAL_COMMUNITY): Payer: Self-pay

## 2023-05-06 ENCOUNTER — Encounter: Payer: Self-pay | Admitting: Gastroenterology

## 2023-05-11 ENCOUNTER — Other Ambulatory Visit (HOSPITAL_COMMUNITY): Payer: Self-pay

## 2023-05-12 ENCOUNTER — Other Ambulatory Visit (HOSPITAL_COMMUNITY): Payer: Self-pay

## 2023-05-12 ENCOUNTER — Other Ambulatory Visit (HOSPITAL_BASED_OUTPATIENT_CLINIC_OR_DEPARTMENT_OTHER): Payer: Self-pay

## 2023-05-12 MED ORDER — POTASSIUM CHLORIDE CRYS ER 10 MEQ PO TBCR
10.0000 meq | EXTENDED_RELEASE_TABLET | Freq: Every day | ORAL | 5 refills | Status: DC
  Filled 2023-05-12: qty 30, 30d supply, fill #0
  Filled 2023-06-17: qty 30, 30d supply, fill #1
  Filled 2023-07-13: qty 30, 30d supply, fill #2
  Filled 2023-08-18: qty 30, 30d supply, fill #3
  Filled 2023-09-22: qty 30, 30d supply, fill #4
  Filled 2023-10-20 – 2023-10-22 (×3): qty 30, 30d supply, fill #5

## 2023-05-22 ENCOUNTER — Ambulatory Visit
Admission: RE | Admit: 2023-05-22 | Discharge: 2023-05-22 | Disposition: A | Discharge status: Home / Self Care | Payer: 59 | Source: Ambulatory Visit | Attending: Hematology | Admitting: Hematology

## 2023-05-22 DIAGNOSIS — Z1231 Encounter for screening mammogram for malignant neoplasm of breast: Secondary | ICD-10-CM | POA: Diagnosis not present

## 2023-05-22 DIAGNOSIS — D0512 Intraductal carcinoma in situ of left breast: Secondary | ICD-10-CM

## 2023-05-29 ENCOUNTER — Inpatient Hospital Stay: Payer: 59 | Attending: Hematology

## 2023-05-29 DIAGNOSIS — Z79810 Long term (current) use of selective estrogen receptor modulators (SERMs): Secondary | ICD-10-CM | POA: Diagnosis not present

## 2023-05-29 DIAGNOSIS — E049 Nontoxic goiter, unspecified: Secondary | ICD-10-CM

## 2023-05-29 DIAGNOSIS — E1165 Type 2 diabetes mellitus with hyperglycemia: Secondary | ICD-10-CM

## 2023-05-29 DIAGNOSIS — D0512 Intraductal carcinoma in situ of left breast: Secondary | ICD-10-CM | POA: Diagnosis not present

## 2023-05-29 DIAGNOSIS — E782 Mixed hyperlipidemia: Secondary | ICD-10-CM

## 2023-05-29 DIAGNOSIS — D509 Iron deficiency anemia, unspecified: Secondary | ICD-10-CM | POA: Insufficient documentation

## 2023-05-29 DIAGNOSIS — Z79899 Other long term (current) drug therapy: Secondary | ICD-10-CM | POA: Insufficient documentation

## 2023-05-29 LAB — CBC WITH DIFFERENTIAL/PLATELET
Abs Immature Granulocytes: 0.02 10*3/uL (ref 0.00–0.07)
Basophils Absolute: 0 10*3/uL (ref 0.0–0.1)
Basophils Relative: 0 %
Eosinophils Absolute: 0.6 10*3/uL — ABNORMAL HIGH (ref 0.0–0.5)
Eosinophils Relative: 7 %
HCT: 38.6 % (ref 36.0–46.0)
Hemoglobin: 13.1 g/dL (ref 12.0–15.0)
Immature Granulocytes: 0 %
Lymphocytes Relative: 32 %
Lymphs Abs: 2.8 10*3/uL (ref 0.7–4.0)
MCH: 30.2 pg (ref 26.0–34.0)
MCHC: 33.9 g/dL (ref 30.0–36.0)
MCV: 88.9 fL (ref 80.0–100.0)
Monocytes Absolute: 0.6 10*3/uL (ref 0.1–1.0)
Monocytes Relative: 6 %
Neutro Abs: 4.7 10*3/uL (ref 1.7–7.7)
Neutrophils Relative %: 55 %
Platelets: 201 10*3/uL (ref 150–400)
RBC: 4.34 MIL/uL (ref 3.87–5.11)
RDW: 12.7 % (ref 11.5–15.5)
WBC: 8.6 10*3/uL (ref 4.0–10.5)
nRBC: 0 % (ref 0.0–0.2)

## 2023-05-29 LAB — IRON AND TIBC
Iron: 88 ug/dL (ref 28–170)
Saturation Ratios: 31 % (ref 10.4–31.8)
TIBC: 287 ug/dL (ref 250–450)
UIBC: 199 ug/dL

## 2023-05-29 LAB — COMPREHENSIVE METABOLIC PANEL
ALT: 20 U/L (ref 0–44)
AST: 17 U/L (ref 15–41)
Albumin: 3.5 g/dL (ref 3.5–5.0)
Alkaline Phosphatase: 55 U/L (ref 38–126)
Anion gap: 10 (ref 5–15)
BUN: 12 mg/dL (ref 6–20)
CO2: 24 mmol/L (ref 22–32)
Calcium: 9.3 mg/dL (ref 8.9–10.3)
Chloride: 101 mmol/L (ref 98–111)
Creatinine, Ser: 0.51 mg/dL (ref 0.44–1.00)
GFR, Estimated: >60 mL/min (ref >60)
Glucose, Bld: 137 mg/dL — ABNORMAL HIGH (ref 70–99)
Potassium: 3.4 mmol/L — ABNORMAL LOW (ref 3.5–5.1)
Sodium: 135 mmol/L (ref 135–145)
Total Bilirubin: 0.2 mg/dL (ref 0.0–1.2)
Total Protein: 6.3 g/dL — ABNORMAL LOW (ref 6.5–8.1)

## 2023-05-29 LAB — HEMOGLOBIN A1C
Hgb A1c MFr Bld: 5.5 % (ref 4.8–5.6)
Mean Plasma Glucose: 111.15 mg/dL

## 2023-05-29 LAB — FERRITIN: Ferritin: 80 ng/mL (ref 11–307)

## 2023-05-29 LAB — TSH: TSH: 1.46 u[IU]/mL (ref 0.350–4.500)

## 2023-05-29 LAB — VITAMIN D 25 HYDROXY (VIT D DEFICIENCY, FRACTURES): Vit D, 25-Hydroxy: 57.63 ng/mL (ref 30–100)

## 2023-05-29 LAB — T4, FREE: Free T4: 0.98 ng/dL (ref 0.61–1.12)

## 2023-05-30 NOTE — Progress Notes (Signed)
 Referring Provider: Roe Rutherford, NP Primary Care Physician:  Roe Rutherford, NP Primary GI Physician: Dr. Marletta Lor  Chief Complaint  Patient presents with   Follow-up    Follow up. Was having some diarrhea     HPI:   Lorraine Peters is a 53 y.o. female with GI history of GERD, multiple adenomatous colon polyps, abdominal pain, fatty liver, constipation, iron deficiency anemia, adenomatous colon polyps, hiatal hernia, presenting today for follow-up of IDA.   She was initially seen in our office in November 2023 for iron deficiency anemia.  She denied overt GI bleeding though stools dark on iron which was started in October 2023.  She was scheduled for EGD and colonoscopy.  Colonoscopy 02/11/2022: Three 3-6 mm polyps in the descending colon resected and retrieved.  Pathology with tubular adenomas.  Recommended 5-year surveillance.   EGD 02/11/2022: Patulous EG junction, medium sized hiatal hernia, otherwise normal exam.  Last seen in the office 11/07/2022.  Most recent labs showed hemoglobin of 14.2 with iron panel within normal limits.  Denies overt GI bleeding or NSAID use.  Reported history of H. pylori treated with antibiotics last year.  Noted new onset generalized abdominal pain/full sensation, nausea starting a week prior after eating jambalaya.  Symptoms were gradually improving and actually resolved the day that she presented to the office.  Recommended givens capsule to complete GI evaluation, repeat H. pylori breath test in the future.   Givens capsule completed 11/29/2022.  She is found to have small bowel erosions, few isolated tiny flecks of red blood in the distal small bowel without evidence of active bleeding, small bowel angiectasia's, small polyp in the distal small bowel versus mucosal fold.  Commend she continue daily iron, omeprazole, follow-up in 6 months.  H. pylori breath test was negative on 12/24/2022.  Today:  Most recent labs 05/29/2023 with hemoglobin within  normal limits at 13.1.  Iron panel within normal limits.   No BRBPR or melena.  For the last 2 weeks, has been having an episode of diarrhea around 1230 or 1 PM after she eats lunch.  States her stomach would start rolling, but not really cramping or a lot of abdominal pain.  Quit taking MiraLAX.  Ultimately, got constipated about 2 days ago.  She took a dose of MiraLAX and had a bowel movement the next day that was hard, then a loose bowel movement yesterday afternoon, so she did not take MiraLAX last night.   She is still taking her iron pill every day, but would like to stop this if possible.  Notes that she did eat a cup of strawberries and kiwi every day before diarrhea started.  No new medications. No recent antibiotics or sick contacts.  Taking omeprazole 40 mg once daily. GERD is well controlled.   Past Medical History:  Diagnosis Date   Breast cancer (HCC)    Breast disorder    left breast at age 89   Cigarette nicotine dependence, uncomplicated 02/21/2015   Family history of breast cancer    Family history of carcinoma in situ of anal canal 09/01/2017   Family history of colon cancer    H. pylori infection    History of kidney stones    Hypercholesterolemia    Hypertension    Migraine    Ovarian cyst    Personal history of radiation therapy    PONV (postoperative nausea and vomiting)    Reflux    right 05/23/2020   right  Past Surgical History:  Procedure Laterality Date   BREAST BIOPSY Left 04/2019   BREAST BIOPSY Right 05/23/2020   BREAST LUMPECTOMY Left 2020   COLONOSCOPY N/A 12/30/2018   six 4-7 mm polyps in rectum, descending colon, and ascending colon. Sigmoid and descending colon diverticulosis. Serrated and tubular adenoma. Surveillance 3 years.    COLONOSCOPY WITH PROPOFOL N/A 02/11/2022   Procedure: COLONOSCOPY WITH PROPOFOL;  Surgeon: Corbin Ade, MD;  [facility];  Service: Endoscopy;  Laterality: N/A;  9:45am, asa 2    ESOPHAGOGASTRODUODENOSCOPY N/A 09/12/2017   normal esophagus, small hiatal hernia, large pedunculated duodenal polyp s/p resection and clip placement. Benign polyp   ESOPHAGOGASTRODUODENOSCOPY (EGD) WITH PROPOFOL N/A 02/11/2022   Procedure: ESOPHAGOGASTRODUODENOSCOPY (EGD) WITH PROPOFOL;  Surgeon: Corbin Ade, MD;  [facility];  Service: Endoscopy;  Laterality: N/A;   GIVENS CAPSULE STUDY N/A 11/29/2022   Procedure: GIVENS CAPSULE STUDY;  Surgeon: Lanelle Bal, DO;  [facility];  Service: Endoscopy;  Laterality: N/A;  7:30 am   PARTIAL MASTECTOMY WITH NEEDLE LOCALIZATION AND AXILLARY SENTINEL LYMPH NODE BX Left 04/29/2018   Procedure: PARTIAL MASTECTOMY WITH NEEDLE LOCALIZATION AND AXILLARY SENTINEL LYMPH NODE BX;  Surgeon: Lucretia Roers, MD;  [facility]: [facility];  Service: General;  Laterality: Left;   POLYPECTOMY  12/30/2018   Procedure: POLYPECTOMY;  Surgeon: Corbin Ade, MD;  [facility];  Service: Endoscopy;;  ascending,descending,rectal   POLYPECTOMY  02/11/2022   Procedure: POLYPECTOMY;  Surgeon: Corbin Ade, MD;  [facility];  Service: Endoscopy;;   TUBAL LIGATION      Current Outpatient Medications  Medication Sig Dispense Refill   ALPRAZolam (XANAX) 0.25 MG tablet Take 1 tablet (0.25 mg total) by mouth at bedtime as needed for anxiety. 30 tablet 2   atorvastatin (LIPITOR) 10 MG tablet Take 1 tablet (10 mg total) by mouth daily. 90 tablet 1   CALCIUM PO Take 2,400 mg by mouth daily. D3     ferrous sulfate 325 (65 FE) MG EC tablet Take 325 mg by mouth daily with breakfast.     fluticasone (FLONASE) 50 MCG/ACT nasal spray Place 2 sprays into both nostrils daily. (Patient taking differently: Place 2 sprays into both nostrils daily. As needed) 16 g 5   hydrochlorothiazide (HYDRODIURIL) 25 MG tablet Take 1 tablet (25 mg total) by mouth in the morning. (Patient taking differently: Take 12.5 mg by mouth in the morning.) 90 tablet  2   levocetirizine (XYZAL) 5 MG tablet Take 5 mg by mouth every evening.     meclizine (ANTIVERT) 25 MG tablet Take 1 tablet (25 mg total) by mouth 3 (three) times daily as needed for dizziness. 30 tablet 0   olmesartan (BENICAR) 40 MG tablet Take 1 tablet (40 mg total) by mouth daily. 90 tablet 1   omeprazole (PRILOSEC) 40 MG capsule Take 1 capsule (40 mg total) by mouth 2 (two) times daily before a meal. (Patient taking differently: Take 40 mg by mouth daily.) 60 capsule 3   ondansetron (ZOFRAN-ODT) 4 MG disintegrating tablet Take 1 tablet (4 mg total) by mouth every 8 (eight) hours as needed for nausea for up to 7 days. 20 tablet 3   polyethylene glycol (MIRALAX / GLYCOLAX) 17 g packet Take 17 g by mouth at bedtime.     potassium chloride (KLOR-CON M) 10 MEQ tablet Take 1 tablet (10 mEq total) by mouth daily. 30 tablet 5   Probiotic Product (PROBIOTIC  PO) Take 1 tablet by mouth daily.     tamoxifen (NOLVADEX) 20 MG tablet Take 1 tablet (20 mg total) by mouth daily. 30 tablet 11   Boric Acid Vaginal 600 MG SUPP Place one suppository in the vagina nightly x 7 days (Patient not taking: Reported on 06/02/2023) 7 suppository 0   diazepam (VALIUM) 5 MG tablet Take 1 tablet (5 mg total) by mouth at bedtime as needed. Max daily amount is 1 tablet. (Patient not taking: Reported on 06/02/2023) 30 tablet 5   fluticasone (FLONASE) 50 MCG/ACT nasal spray Place 2 sprays into both nostrils daily. (Patient not taking: Reported on 06/02/2023) 16 g 5   No current facility-administered medications for this visit.    Allergies as of 06/02/2023 - Review Complete 06/02/2023  Allergen Reaction Noted   Bee venom Swelling and Other (See Comments) 04/29/2011    Family History  Problem Relation Age of Onset   Arthritis Mother    Cancer Mother        breast   Breast cancer Mother 81       recurrance at 66   COPD Father    Heart disease Father    Hyperlipidemia Father    Hypertension Father    Cancer Sister 43        anal, deceased due to mva but had advanced disease   Stroke Maternal Grandfather    Breast cancer Maternal Aunt 24    Social History   Socioeconomic History   Marital status: Divorced    Spouse name: Not on file   Number of children: 1   Years of education: Not on file   Highest education level: Not on file  Occupational History   Not on file  Tobacco Use   Smoking status: Former    Current packs/day: 0.00    Average packs/day: 0.5 packs/day for 28.0 years (14.0 ttl pk-yrs)    Types: Cigarettes    Start date: 04/22/1990    Quit date: 04/22/2018    Years since quitting: 5.1   Smokeless tobacco: Never  Vaping Use   Vaping status: Never Used  Substance and Sexual Activity   Alcohol use: No    Alcohol/week: 0.0 standard drinks of alcohol   Drug use: No   Sexual activity: Not Currently    Birth control/protection: Surgical    Comment: tubal  Other Topics Concern   Not on file  Social History Narrative   Not on file   Social Drivers of Health   Financial Resource Strain: Low Risk  (11/21/2022)   Overall Financial Resource Strain (CARDIA)    Difficulty of Paying Living Expenses: Not very hard  Food Insecurity: No Food Insecurity (11/21/2022)   Hunger Vital Sign    Worried About Running Out of Food in the Last Year: Never true    Ran Out of Food in the Last Year: Never true  Transportation Needs: No Transportation Needs (11/21/2022)   PRAPARE - Administrator, Civil Service (Medical): No    Lack of Transportation (Non-Medical): No  Physical Activity: Insufficiently Active (11/21/2022)   Exercise Vital Sign    Days of Exercise per Week: 4 days    Minutes of Exercise per Session: 20 min  Stress: Stress Concern Present (11/21/2022)   [employer] of Occupational Health - Occupational Stress Questionnaire    Feeling of Stress : To some extent  Social Connections: Moderately Integrated (11/21/2022)   Social Connection and Isolation Panel [NHANES]     Frequency of  Communication with Friends and Family: More than three times a week    Frequency of Social Gatherings with Friends and Family: More than three times a week    Attends Religious Services: More than 4 times per year    Active Member of [employer] or Organizations: Yes    Attends Banker Meetings: 1 to 4 times per year    Marital Status: Divorced    Review of Systems: Gen: Denies fever, chills, cold or flu like symptoms, pre-syncope, or syncope.  GI: See HPI Heme: See HPI  Physical Exam: BP 127/83 (BP Location: Right Arm, Patient Position: Sitting, Cuff Size: Normal)   Pulse 80   Temp 98.1 F (36.7 C) (Temporal)   Ht 5\' 7"  (1.702 m)   Wt 195 lb 6.4 oz (88.6 kg)   LMP 09/22/2018 (Approximate) Comment:  on tamoxifen no period over a year bleedi 11/14 like a period   BMI 30.60 kg/m  General:   Alert and oriented. No distress noted. Pleasant and cooperative.  Head:  Normocephalic and atraumatic. Eyes:  Conjuctiva clear without scleral icterus. Abdomen:  +BS, soft, non-tender and non-distended. No rebound or guarding. No HSM or masses noted. Msk:  Symmetrical without gross deformities. Normal posture.  Extremities:  Without edema.  Neurologic:  Alert and  oriented x4.  Psych:  Normal mood and affect.    Assessment:  53 y.o. female with GI history of GERD, multiple adenomatous colon polyps, abdominal pain, fatty liver, constipation, iron deficiency anemia, adenomatous colon polyps, hiatal hernia, presenting today for follow-up of IDA.   IDA:  No overt GI bleeding.  S/p complete GI workup including EGD and colonoscopy 02/11/2022 with 3 tubular adenomas that were removed from her colon with recommendations for surveillance in 5 years; EGD with patulous GE junction, medium sized hiatal hernia, otherwise normal exam. Givens capsule completed 11/29/2022 found to have small bowel erosions, few isolated tiny flecks of red blood in the distal small bowel without evidence of  active bleeding, small bowel angiectasia's, small polyp in the distal small bowel versus mucosal fold. She has maintained on daily iron and Hgb has been within normal limits since November 2023.  Iron panel also within normal limits. Considering this, patient is requesting to stop iron and monitor for now.   We discussed her chronic IDA is likely secondary to occult GI bleeding from her small bowel.  We can stop her iron for now and monitor, but if hemoglobin/iron decline, would recommend resuming iron daily indefinitely.   GERD:  Well controlled on omeprazole 40 mg daily.   Chronic constipation/diarrhea: Constipation previously controlled on MiraLAX daily.  Developed diarrhea with 1 diarrhea type stool per day for the last 2 weeks.  Stopped MiraLAX and became constipated, but when she resumed MiraLAX, she had another episode of diarrhea.  Diarrhea could be related to overflow versus MiraLAX working a little too well as she also recently increased her fiber intake. We will change her bowel regimen and have her take Colace in place of MiraLAX.     Plan:  Okay to stop iron for now. Repeat CBC and iron panel at upcoming appointment with PCP on 07/14/2023. As long as hemoglobin and iron stay within normal limits, no need to take iron.  If declining, would need to resume iron daily. Avoid NSAIDs. Continue omeprazole 40 mg daily. Stop MiraLAX. Start 100 mg twice daily.  May increase to a total of 40 mg/day if needed. Benefiber 3 teaspoons daily x 2 weeks, then  increase to twice daily. Consume plenty of fruits, vegetables, whole grains to maintain adequate fiber intake. Follow-up in 3 months or sooner if needed.   Ermalinda Memos, PA-C [facility] 06/02/2023

## 2023-06-02 ENCOUNTER — Ambulatory Visit (INDEPENDENT_AMBULATORY_CARE_PROVIDER_SITE_OTHER): Admitting: Gastroenterology

## 2023-06-02 ENCOUNTER — Encounter: Payer: Self-pay | Admitting: Gastroenterology

## 2023-06-02 VITALS — BP 127/83 | HR 80 | Temp 98.1°F | Ht 67.0 in | Wt 195.4 lb

## 2023-06-02 DIAGNOSIS — R197 Diarrhea, unspecified: Secondary | ICD-10-CM | POA: Diagnosis not present

## 2023-06-02 DIAGNOSIS — K5909 Other constipation: Secondary | ICD-10-CM

## 2023-06-02 DIAGNOSIS — D509 Iron deficiency anemia, unspecified: Secondary | ICD-10-CM

## 2023-06-02 DIAGNOSIS — Z8719 Personal history of other diseases of the digestive system: Secondary | ICD-10-CM | POA: Diagnosis not present

## 2023-06-02 DIAGNOSIS — K219 Gastro-esophageal reflux disease without esophagitis: Secondary | ICD-10-CM

## 2023-06-02 NOTE — Patient Instructions (Signed)
 You can try holding iron for now and we will monitor your hemoglobin and iron levels. You can have blood work completed with your primary care doctor in May. Be sure to consume an iron rich diet.  See separate handout.   Continue omeprazole 40 mg daily to control GERD.  To help with alternating constipation and diarrhea: Stop MiraLAX for now. Start Benefiber 3 teaspoons daily x 2 weeks, then increase to twice daily. Start Colace (docusate sodium) 100 mg twice a day.  After 1 week, if you are still having some constipation, you can increase Colace (the 2 docusate sodium) to a total of 300 mg/day.  Continue eating plenty of fruits, vegetables, whole grains to maintain adequate fiber intake.  I will plan to see you back in 3 months or sooner if needed.  It was good to see you again today!  Ermalinda Memos, PA-C [facility]

## 2023-06-03 ENCOUNTER — Other Ambulatory Visit: Payer: 59

## 2023-06-09 NOTE — Progress Notes (Signed)
 [facility] [address] [place] [address]    Clinic Day:  06/10/2023  Referring physician: Roe Rutherford, NP  Patient Care Team: Roe Rutherford, NP as PCP - General (Adult Health Nurse Practitioner) [place] Bali, MD (Inactive) as Consulting Physician (Gastroenterology)   ASSESSMENT & PLAN:   Assessment: 1.  Left breast high-grade DCIS: -Lumpectomy on 04/29/2018, 0.5 cm high-grade DCIS, ER 70%, PR 80%, margins negative, 0/1 lymph node positive, pTis PN 0. -She received radiation therapy. -Tamoxifen started on 05/12/2018. -Bilateral mammogram on 04/06/2019 showed left breast calcifications. -Left upper outer quadrant needle biopsy on 04/15/2018 shows benign breast tissue with calcifications. -She had mammogram of the bilateral breasts on 05/15/2020 which showed indeterminate 4 mm group of calcifications in the upper outer quadrant of the right breast. -She had biopsy on 05/23/2020 which was consistent with fibrocystic change with calcifications, usual ductal hyperplasia and sclerosing adenosis. -Endometrial biopsy on 02/14/2020 shows benign endometrial type polyp with proliferative endometrium with no hyperplasia or malignancy.  She denies any spotting at this time.   2.  Family history: -Mother had breast and lung cancer.  Maternal aunt had breast cancer.  Maternal uncle had colon cancer.  Sister had anal cancer. -Genetic testing was negative.  Plan: 1.  Left breast high-grade DCIS: - Bilateral screening mammogram (05/22/2023): Reviewed by me shows BI-RADS Category 1. - She is tolerating tamoxifen very well.  She will stop tamoxifen after finishing the bottle. - Physical exam: Left breast lumpectomy scar upper outer quadrant is stable.  No palpable masses.  Medial left breast is dense and fibrotic and stable.  No palpable adenopathy. - Labs from 05/29/2023: Normal LFTs.  CBC normal. - She would like to continue follow-up with [place].  She will come back in 1 year  with repeat mammogram and labs.   2.  Bone health: - Continue calcium and vitamin D supplements.  Vitamin D level is 57.   3.  Iron deficiency anemia: - Latest labs show ferritin has improved to 80 from 24.  Hemoglobin is 13.1. - She will cut back iron to 3 times a week on Monday, Wednesday and Friday.  No orders of the defined types were placed in this encounter.     Alben Deeds Teague,acting as a Neurosurgeon for Doreatha Massed, MD.,have documented all relevant documentation on the behalf of Doreatha Massed, MD,as directed by  Doreatha Massed, MD while in the presence of Doreatha Massed, MD.  I, Doreatha Massed MD, have reviewed the above documentation for accuracy and completeness, and I agree with the above.    Doreatha Massed, MD   4/1/202512:51 PM  CHIEF COMPLAINT:   Diagnosis: left breast high-grade DCIS    Cancer Staging  No matching staging information was found for the patient.    Prior Therapy:  Lumpectomy on 04/29/2018  radiation therapy   Current Therapy:  Tamoxifen started on 05/12/2018   HISTORY OF PRESENT ILLNESS:   Oncology History  Ductal carcinoma in situ (DCIS) of left breast with comedonecrosis  04/22/2018 Initial Diagnosis   Ductal carcinoma in situ (DCIS) of left breast with comedonecrosis   10/30/2018 Genetic Testing   Negative genetic testing on the common hereditary cancer panel.  The Common Hereditary Gene Panel offered by [facility] includes sequencing and/or deletion duplication testing of the following 48 genes: APC, ATM, AXIN2, BARD1, BMPR1A, BRCA1, BRCA2, BRIP1, CDH1, CDK4, CDKN2A (p14ARF), CDKN2A (p16INK4a), CHEK2, CTNNA1, DICER1, EPCAM (Deletion/duplication testing only), GREM1 (promoter region deletion/duplication testing only), KIT, MEN1,  MLH1, MSH2, MSH3, MSH6, MUTYH, NBN, NF1, NHTL1, PALB2, PDGFRA, PMS2, POLD1, POLE, PTEN, RAD50, RAD51C, RAD51D, RNF43, SDHB, SDHC, SDHD, SMAD4, SMARCA4. STK11, TP53, TSC1, TSC2, and VHL.  The  following genes were evaluated for sequence changes only: SDHA and HOXB13 c.251G>A variant only. The report date is October 30, 2018.      INTERVAL HISTORY:   Lorraine Peters is a 53 y.o. female presenting to clinic today for follow up of left breast high-grade DCIS. She was last seen by me on 06/26/22.  Since her last visit, she underwent bilateral mammogram on 05/22/23 that found: No mammographic evidence of malignancy.   Today, she states that she is doing well overall. Her appetite level is at 100%. Her energy level is at 100%.  PAST MEDICAL HISTORY:   Past Medical History: Past Medical History:  Diagnosis Date   Breast cancer (HCC)    Breast disorder    left breast at age 50   Cigarette nicotine dependence, uncomplicated 02/21/2015   Family history of breast cancer    Family history of carcinoma in situ of anal canal 09/01/2017   Family history of colon cancer    H. pylori infection    History of kidney stones    Hypercholesterolemia    Hypertension    Migraine    Ovarian cyst    Personal history of radiation therapy    PONV (postoperative nausea and vomiting)    Reflux    right 05/23/2020   right    Surgical History: Past Surgical History:  Procedure Laterality Date   BREAST BIOPSY Left 04/2019   BREAST BIOPSY Right 05/23/2020   BREAST LUMPECTOMY Left 2020   COLONOSCOPY N/A 12/30/2018   six 4-7 mm polyps in rectum, descending colon, and ascending colon. Sigmoid and descending colon diverticulosis. Serrated and tubular adenoma. Surveillance 3 years.    COLONOSCOPY WITH PROPOFOL N/A 02/11/2022   Procedure: COLONOSCOPY WITH PROPOFOL;  Surgeon: Corbin Ade, MD;  [facility];  Service: Endoscopy;  Laterality: N/A;  9:45am, asa 2   ESOPHAGOGASTRODUODENOSCOPY N/A 09/12/2017   normal esophagus, small hiatal hernia, large pedunculated duodenal polyp s/p resection and clip placement. Benign polyp   ESOPHAGOGASTRODUODENOSCOPY (EGD) WITH PROPOFOL N/A 02/11/2022    Procedure: ESOPHAGOGASTRODUODENOSCOPY (EGD) WITH PROPOFOL;  Surgeon: Corbin Ade, MD;  [facility]: [facility];  Service: Endoscopy;  Laterality: N/A;   GIVENS CAPSULE STUDY N/A 11/29/2022   Procedure: GIVENS CAPSULE STUDY;  Surgeon: Lanelle Bal, DO;  [facility]: [facility];  Service: Endoscopy;  Laterality: N/A;  7:30 am   PARTIAL MASTECTOMY WITH NEEDLE LOCALIZATION AND AXILLARY SENTINEL LYMPH NODE BX Left 04/29/2018   Procedure: PARTIAL MASTECTOMY WITH NEEDLE LOCALIZATION AND AXILLARY SENTINEL LYMPH NODE BX;  Surgeon: Lucretia Roers, MD;  Location: [facility];  Service: General;  Laterality: Left;   POLYPECTOMY  12/30/2018   Procedure: POLYPECTOMY;  Surgeon: Corbin Ade, MD;  [facility]: [facility];  Service: Endoscopy;;  ascending,descending,rectal   POLYPECTOMY  02/11/2022   Procedure: POLYPECTOMY;  Surgeon: Corbin Ade, MD;  [facility]: [facility];  Service: Endoscopy;;   TUBAL LIGATION      Social History: Social History   Socioeconomic History   Marital status: Divorced    Spouse name: Not on file   Number of children: 1   Years of education: Not on file   Highest education level: Not on file  Occupational History   Not on file  Tobacco Use   Smoking status: Former  Current packs/day: 0.00    Average packs/day: 0.5 packs/day for 28.0 years (14.0 ttl pk-yrs)    Types: Cigarettes    Start date: 04/22/1990    Quit date: 04/22/2018    Years since quitting: 5.1   Smokeless tobacco: Never  Vaping Use   Vaping status: Never Used  Substance and Sexual Activity   Alcohol use: No    Alcohol/week: 0.0 standard drinks of alcohol   Drug use: No   Sexual activity: Not Currently    Birth control/protection: Surgical    Comment: tubal  Other Topics Concern   Not on file  Social History Narrative   Not on file   Social Drivers of Health   Financial Resource Strain: Low Risk  (11/21/2022)   Overall Financial Resource Strain (CARDIA)    Difficulty of Paying  Living Expenses: Not very hard  Food Insecurity: No Food Insecurity (11/21/2022)   Hunger Vital Sign    Worried About Running Out of Food in the Last Year: Never true    Ran Out of Food in the Last Year: Never true  Transportation Needs: No Transportation Needs (11/21/2022)   PRAPARE - Administrator, Civil Service (Medical): No    Lack of Transportation (Non-Medical): No  Physical Activity: Insufficiently Active (11/21/2022)   Exercise Vital Sign    Days of Exercise per Week: 4 days    Minutes of Exercise per Session: 20 min  Stress: Stress Concern Present (11/21/2022)   [employer] of Occupational Health - Occupational Stress Questionnaire    Feeling of Stress : To some extent  Social Connections: Moderately Integrated (11/21/2022)   Social Connection and Isolation Panel [NHANES]    Frequency of Communication with Friends and Family: More than three times a week    Frequency of Social Gatherings with Friends and Family: More than three times a week    Attends Religious Services: More than 4 times per year    Active Member of [employer] or Organizations: Yes    Attends Banker Meetings: 1 to 4 times per year    Marital Status: Divorced  Intimate Partner Violence: Not At Risk (11/21/2022)   Humiliation, Afraid, Rape, and Kick questionnaire    Fear of Current or Ex-Partner: No    Emotionally Abused: No    Physically Abused: No    Sexually Abused: No    Family History: Family History  Problem Relation Age of Onset   Arthritis Mother    Cancer Mother        breast   Breast cancer Mother 72       recurrance at 2   COPD Father    Heart disease Father    Hyperlipidemia Father    Hypertension Father    Cancer Sister 26       anal, deceased due to mva but had advanced disease   Stroke Maternal Grandfather    Breast cancer Maternal Aunt 90    Current Medications:  Current Outpatient Medications:    ALPRAZolam (XANAX) 0.25 MG tablet, Take 1 tablet (0.25  mg total) by mouth at bedtime as needed for anxiety., Disp: 30 tablet, Rfl: 2   atorvastatin (LIPITOR) 10 MG tablet, Take 1 tablet (10 mg total) by mouth daily., Disp: 90 tablet, Rfl: 1   Boric Acid Vaginal 600 MG SUPP, Place one suppository in the vagina nightly x 7 days (Patient not taking: Reported on 06/02/2023), Disp: 7 suppository, Rfl: 0   CALCIUM PO, Take 2,400 mg by mouth  daily. D3, Disp: , Rfl:    diazepam (VALIUM) 5 MG tablet, Take 1 tablet (5 mg total) by mouth at bedtime as needed. Max daily amount is 1 tablet. (Patient not taking: Reported on 06/02/2023), Disp: 30 tablet, Rfl: 5   ferrous sulfate 325 (65 FE) MG EC tablet, Take 325 mg by mouth daily with breakfast., Disp: , Rfl:    fluticasone (FLONASE) 50 MCG/ACT nasal spray, Place 2 sprays into both nostrils daily. (Patient taking differently: Place 2 sprays into both nostrils daily. As needed), Disp: 16 g, Rfl: 5   fluticasone (FLONASE) 50 MCG/ACT nasal spray, Place 2 sprays into both nostrils daily. (Patient not taking: Reported on 06/02/2023), Disp: 16 g, Rfl: 5   hydrochlorothiazide (HYDRODIURIL) 25 MG tablet, Take 1 tablet (25 mg total) by mouth in the morning. (Patient taking differently: Take 12.5 mg by mouth in the morning.), Disp: 90 tablet, Rfl: 2   levocetirizine (XYZAL) 5 MG tablet, Take 5 mg by mouth every evening., Disp: , Rfl:    meclizine (ANTIVERT) 25 MG tablet, Take 1 tablet (25 mg total) by mouth 3 (three) times daily as needed for dizziness., Disp: 30 tablet, Rfl: 0   olmesartan (BENICAR) 40 MG tablet, Take 1 tablet (40 mg total) by mouth daily., Disp: 90 tablet, Rfl: 1   omeprazole (PRILOSEC) 40 MG capsule, Take 1 capsule (40 mg total) by mouth 2 (two) times daily before a meal. (Patient taking differently: Take 40 mg by mouth daily.), Disp: 60 capsule, Rfl: 3   ondansetron (ZOFRAN-ODT) 4 MG disintegrating tablet, Take 1 tablet (4 mg total) by mouth every 8 (eight) hours as needed for nausea for up to 7 days., Disp: 20  tablet, Rfl: 3   polyethylene glycol (MIRALAX / GLYCOLAX) 17 g packet, Take 17 g by mouth at bedtime., Disp: , Rfl:    potassium chloride (KLOR-CON M) 10 MEQ tablet, Take 1 tablet (10 mEq total) by mouth daily., Disp: 30 tablet, Rfl: 5   Probiotic Product (PROBIOTIC PO), Take 1 tablet by mouth daily., Disp: , Rfl:    tamoxifen (NOLVADEX) 20 MG tablet, Take 1 tablet (20 mg total) by mouth daily., Disp: 30 tablet, Rfl: 11   Allergies: Allergies  Allergen Reactions   Bee Venom Swelling and Other (See Comments)    Severe swelling    REVIEW OF SYSTEMS:   Review of Systems  Constitutional:  Negative for chills, fatigue and fever.  HENT:   Negative for lump/mass, mouth sores, nosebleeds, sore throat and trouble swallowing.   Eyes:  Negative for eye problems.  Respiratory:  Negative for cough and shortness of breath.   Cardiovascular:  Negative for chest pain, leg swelling and palpitations.  Gastrointestinal:  Negative for abdominal pain, constipation, diarrhea, nausea and vomiting.  Genitourinary:  Negative for bladder incontinence, difficulty urinating, dysuria, frequency, hematuria and nocturia.   Musculoskeletal:  Negative for arthralgias, back pain, flank pain, myalgias and neck pain.  Skin:  Negative for itching and rash.  Neurological:  Negative for dizziness, headaches and numbness.  Hematological:  Does not bruise/bleed easily.  Psychiatric/Behavioral:  Negative for depression, sleep disturbance and suicidal ideas. The patient is not nervous/anxious.   All other systems reviewed and are negative.    VITALS:   Last menstrual period 09/22/2018.  Wt Readings from Last 3 Encounters:  06/02/23 195 lb 6.4 oz (88.6 kg)  11/21/22 193 lb 12.8 oz (87.9 kg)  11/07/22 196 lb (88.9 kg)    There is no height or weight on  file to calculate BMI.  Performance status (ECOG): 0 - Asymptomatic  PHYSICAL EXAM:   Physical Exam Vitals and nursing note reviewed. Exam conducted with a chaperone  present.  Constitutional:      Appearance: Normal appearance.  Cardiovascular:     Rate and Rhythm: Normal rate and regular rhythm.     Pulses: Normal pulses.     Heart sounds: Normal heart sounds.  Pulmonary:     Effort: Pulmonary effort is normal.     Breath sounds: Normal breath sounds.  Abdominal:     Palpations: Abdomen is soft. There is no hepatomegaly, splenomegaly or mass.     Tenderness: There is no abdominal tenderness.  Musculoskeletal:     Right lower leg: No edema.     Left lower leg: No edema.  Lymphadenopathy:     Cervical: No cervical adenopathy.     Right cervical: No superficial, deep or posterior cervical adenopathy.    Left cervical: No superficial, deep or posterior cervical adenopathy.     Upper Body:     Right upper body: No supraclavicular or axillary adenopathy.     Left upper body: No supraclavicular or axillary adenopathy.  Neurological:     General: No focal deficit present.     Mental Status: She is alert and oriented to person, place, and time.  Psychiatric:        Mood and Affect: Mood normal.        Behavior: Behavior normal.    LABS:      Latest Ref Rng & Units 05/29/2023    3:06 PM 12/25/2022    3:12 PM 06/19/2022    3:19 PM  CBC  WBC 4.0 - 10.5 K/uL 8.6  8.8  8.6   Hemoglobin 12.0 - 15.0 g/dL 78.2  95.6  21.3   Hematocrit 36.0 - 46.0 % 38.6  40.5  43.2   Platelets 150 - 400 K/uL 201  232  230       Latest Ref Rng & Units 05/29/2023    3:06 PM 06/19/2022    3:19 PM 02/04/2022   10:46 AM  CMP  Glucose 70 - 99 mg/dL 086  87  578   BUN 6 - 20 mg/dL 12  13  15    Creatinine 0.44 - 1.00 mg/dL 4.69  6.29  5.28   Sodium 135 - 145 mmol/L 135  138  141   Potassium 3.5 - 5.1 mmol/L 3.4  3.4  4.0   Chloride 98 - 111 mmol/L 101  103  108   CO2 22 - 32 mmol/L 24  23  26    Calcium 8.9 - 10.3 mg/dL 9.3  9.3  9.0   Total Protein 6.5 - 8.1 g/dL 6.3  6.7    Total Bilirubin 0.0 - 1.2 mg/dL 0.2  0.5    Alkaline Phos 38 - 126 U/L 55  61    AST 15 -  41 U/L 17  14    ALT 0 - 44 U/L 20  17       No results found for: "CEA1", "CEA" / No results found for: "CEA1", "CEA" No results found for: "PSA1" No results found for: "UXL244" No results found for: "CAN125"  No results found for: "TOTALPROTELP", "ALBUMINELP", "A1GS", "A2GS", "BETS", "BETA2SER", "GAMS", "MSPIKE", "SPEI" Lab Results  Component Value Date   TIBC 287 05/29/2023   TIBC 370 06/19/2022   TIBC 359 02/04/2022   FERRITIN 80 05/29/2023   FERRITIN 24 06/19/2022   FERRITIN 14  02/04/2022   IRONPCTSAT 31 05/29/2023   IRONPCTSAT 22 06/19/2022   IRONPCTSAT 17 02/04/2022   No results found for: "LDH"   STUDIES:   MM 3D SCREENING MAMMOGRAM BILATERAL BREAST Result Date: 05/26/2023 CLINICAL DATA:  Screening. EXAM: DIGITAL SCREENING BILATERAL MAMMOGRAM WITH TOMOSYNTHESIS AND CAD TECHNIQUE: Bilateral screening digital craniocaudal and mediolateral oblique mammograms were obtained. Bilateral screening digital breast tomosynthesis was performed. The images were evaluated with computer-aided detection. COMPARISON:  Previous exam(s). ACR Breast Density Category c: The breasts are heterogeneously dense, which may obscure small masses. FINDINGS: There are no findings suspicious for malignancy. IMPRESSION: No mammographic evidence of malignancy. A result letter of this screening mammogram will be mailed directly to the patient. RECOMMENDATION: Screening mammogram in one year. (Code:SM-B-01Y) BI-RADS CATEGORY  1: Negative. Electronically Signed   By: Baird Lyons M.D.   On: 05/26/2023 17:04     MM 3D SCREEN BREAST BILATERAL Result Date: 05/21/2022 CLINICAL DATA:  Screening. EXAM: DIGITAL SCREENING BILATERAL MAMMOGRAM WITH TOMOSYNTHESIS AND CAD TECHNIQUE: Bilateral screening digital craniocaudal and mediolateral oblique mammograms were obtained. Bilateral screening digital breast tomosynthesis was performed. The images were evaluated with computer-aided detection. COMPARISON:  Previous exam(s).  ACR Breast Density Category c: The breasts are heterogeneously dense, which may obscure small masses. FINDINGS: There are no findings suspicious for malignancy. IMPRESSION: No mammographic evidence of malignancy. A result letter of this screening mammogram will be mailed directly to the patient. RECOMMENDATION: Screening mammogram in one year. (Code:SM-B-01Y) BI-RADS CATEGORY  1: Negative. Electronically Signed   By: Amie Portland M.D.   On: 05/21/2022 16:17

## 2023-06-10 ENCOUNTER — Encounter: Payer: Self-pay | Admitting: Hematology

## 2023-06-10 ENCOUNTER — Inpatient Hospital Stay: Payer: 59 | Attending: Hematology | Admitting: Hematology

## 2023-06-10 VITALS — BP 123/89 | HR 72 | Temp 97.8°F | Resp 18

## 2023-06-10 DIAGNOSIS — Z79810 Long term (current) use of selective estrogen receptor modulators (SERMs): Secondary | ICD-10-CM | POA: Diagnosis not present

## 2023-06-10 DIAGNOSIS — D509 Iron deficiency anemia, unspecified: Secondary | ICD-10-CM | POA: Diagnosis not present

## 2023-06-10 DIAGNOSIS — D0512 Intraductal carcinoma in situ of left breast: Secondary | ICD-10-CM | POA: Diagnosis not present

## 2023-06-10 DIAGNOSIS — E559 Vitamin D deficiency, unspecified: Secondary | ICD-10-CM | POA: Diagnosis not present

## 2023-06-10 NOTE — Patient Instructions (Signed)
 [facility] at [facility] Discharge Instructions   You were seen and examined today by Dr. Ellin Saba.  He reviewed the results of your lab work which are normal/stable.   Your recent mammogram was normal.   You may discontinue Tamoxifen as you have reached your five year mark.   We will see you back in one year. We will arrange for blood work and a mammogram prior to your visit.    Return as scheduled.    Thank you for choosing [facility] at [facility] to provide your oncology and hematology care.  To afford each patient quality time with our provider, please arrive at least 15 minutes before your scheduled appointment time.   If you have a lab appointment with the Cancer Center please come in thru the Main Entrance and check in at the main information desk.  You need to re-schedule your appointment should you arrive 10 or more minutes late.  We strive to give you quality time with our providers, and arriving late affects you and other patients whose appointments are after yours.  Also, if you no show three or more times for appointments you may be dismissed from the clinic at the providers discretion.     Again, thank you for choosing [facility].  Our hope is that these requests will decrease the amount of time that you wait before being seen by our physicians.       _____________________________________________________________  Should you have questions after your visit to [facility], please contact our office at [phone] and follow the prompts.  Our office hours are 8:00 a.m. and 4:30 p.m. Monday - Friday.  Please note that voicemails left after 4:00 p.m. may not be returned until the following business day.  We are closed weekends and major holidays.  You do have access to a nurse 24-7, just call the main number to the clinic [phone] and do not press any options, hold on the line and a nurse will  answer the phone.    For prescription refill requests, have your pharmacy contact our office and allow 72 hours.    Due to Covid, you will need to wear a mask upon entering the hospital. If you do not have a mask, a mask will be given to you at the Main Entrance upon arrival. For doctor visits, patients may have 1 support person age 50 or older with them. For treatment visits, patients can not have anyone with them due to social distancing guidelines and our immunocompromised population.

## 2023-06-17 ENCOUNTER — Other Ambulatory Visit (HOSPITAL_COMMUNITY): Payer: Self-pay

## 2023-06-17 ENCOUNTER — Other Ambulatory Visit: Payer: Self-pay

## 2023-06-17 MED ORDER — OLMESARTAN MEDOXOMIL 40 MG PO TABS
40.0000 mg | ORAL_TABLET | Freq: Every day | ORAL | 1 refills | Status: DC
  Filled 2023-06-17: qty 90, 90d supply, fill #0
  Filled 2023-09-22: qty 90, 90d supply, fill #1

## 2023-06-17 MED ORDER — HYDROCHLOROTHIAZIDE 25 MG PO TABS
25.0000 mg | ORAL_TABLET | Freq: Every morning | ORAL | 2 refills | Status: AC
  Filled 2023-06-17: qty 90, 90d supply, fill #0
  Filled 2023-12-17: qty 90, 90d supply, fill #1

## 2023-06-17 MED ORDER — ATORVASTATIN CALCIUM 10 MG PO TABS
10.0000 mg | ORAL_TABLET | Freq: Every day | ORAL | 1 refills | Status: DC
  Filled 2023-06-17 – 2023-07-13 (×2): qty 90, 90d supply, fill #0
  Filled 2023-09-22 – 2023-10-22 (×3): qty 90, 90d supply, fill #1

## 2023-07-14 ENCOUNTER — Other Ambulatory Visit (HOSPITAL_COMMUNITY): Payer: Self-pay

## 2023-07-14 ENCOUNTER — Other Ambulatory Visit: Payer: Self-pay

## 2023-07-14 DIAGNOSIS — E559 Vitamin D deficiency, unspecified: Secondary | ICD-10-CM | POA: Diagnosis not present

## 2023-07-14 DIAGNOSIS — E049 Nontoxic goiter, unspecified: Secondary | ICD-10-CM | POA: Diagnosis not present

## 2023-07-14 DIAGNOSIS — R7303 Prediabetes: Secondary | ICD-10-CM | POA: Diagnosis not present

## 2023-07-14 DIAGNOSIS — D509 Iron deficiency anemia, unspecified: Secondary | ICD-10-CM | POA: Diagnosis not present

## 2023-07-14 DIAGNOSIS — E1165 Type 2 diabetes mellitus with hyperglycemia: Secondary | ICD-10-CM | POA: Diagnosis not present

## 2023-07-14 DIAGNOSIS — I10 Essential (primary) hypertension: Secondary | ICD-10-CM | POA: Diagnosis not present

## 2023-07-14 DIAGNOSIS — E782 Mixed hyperlipidemia: Secondary | ICD-10-CM | POA: Diagnosis not present

## 2023-07-14 MED ORDER — ALPRAZOLAM 0.25 MG PO TABS
0.2500 mg | ORAL_TABLET | Freq: Every day | ORAL | 2 refills | Status: DC | PRN
  Filled 2023-07-14: qty 30, 30d supply, fill #0

## 2023-07-14 MED ORDER — RIZATRIPTAN BENZOATE 10 MG PO TABS
10.0000 mg | ORAL_TABLET | ORAL | 2 refills | Status: AC
  Filled 2023-07-14: qty 27, 90d supply, fill #0
  Filled 2023-11-16: qty 27, 90d supply, fill #1

## 2023-07-14 MED ORDER — MECLIZINE HCL 25 MG PO TABS
25.0000 mg | ORAL_TABLET | Freq: Three times a day (TID) | ORAL | 2 refills | Status: DC | PRN
  Filled 2023-07-14: qty 30, 10d supply, fill #0

## 2023-07-15 LAB — LAB REPORT - SCANNED
A1c: 5.7
EGFR: 110

## 2023-08-04 ENCOUNTER — Telehealth: Payer: Self-pay | Admitting: Gastroenterology

## 2023-08-04 NOTE — Telephone Encounter (Signed)
 Can you find out if patient had CBC and iron panel with PCP on 07/14/2023?  If so, can we request the records?  If she did not, I recommend updating these labs due to history of iron deficiency anemia.

## 2023-08-05 NOTE — Telephone Encounter (Signed)
Sent pt a [facility] geologist. Pt responded.

## 2023-08-05 NOTE — Telephone Encounter (Signed)
I don't have any results

## 2023-08-08 NOTE — Telephone Encounter (Signed)
 Reviewed labs dated 07/14/2023.  Iron panel within normal limits.  Hemoglobin 14.4.  No additional recommendations.  We need to have these labs scanned into the system.

## 2023-08-11 ENCOUNTER — Encounter: Payer: Self-pay | Admitting: *Deleted

## 2023-08-13 ENCOUNTER — Encounter: Payer: Self-pay | Admitting: *Deleted

## 2023-08-13 NOTE — Telephone Encounter (Signed)
 Sent pt a MyChart message

## 2023-08-18 ENCOUNTER — Other Ambulatory Visit: Payer: Self-pay | Admitting: Gastroenterology

## 2023-08-18 ENCOUNTER — Other Ambulatory Visit (HOSPITAL_COMMUNITY): Payer: Self-pay

## 2023-08-19 ENCOUNTER — Other Ambulatory Visit: Payer: Self-pay

## 2023-09-01 MED ORDER — OMEPRAZOLE 40 MG PO CPDR
40.0000 mg | DELAYED_RELEASE_CAPSULE | Freq: Two times a day (BID) | ORAL | 3 refills | Status: AC
  Filled 2023-09-01: qty 60, 30d supply, fill #0
  Filled 2023-11-11: qty 60, 30d supply, fill #1
  Filled 2023-12-17: qty 60, 30d supply, fill #2
  Filled 2024-02-29 – 2024-03-03 (×2): qty 60, 30d supply, fill #3

## 2023-09-02 ENCOUNTER — Other Ambulatory Visit (HOSPITAL_COMMUNITY): Payer: Self-pay

## 2023-09-02 ENCOUNTER — Other Ambulatory Visit: Payer: Self-pay

## 2023-09-02 NOTE — Progress Notes (Unsigned)
 Referring Provider: Suanne Pfeiffer, NP Primary Care Physician:  Suanne Pfeiffer, NP Primary GI Physician: Dr. Cindie  No chief complaint on file.   HPI:   Lorraine Peters is a 53 y.o. female with GI history of GERD, multiple adenomatous colon polyps, abdominal pain, fatty liver, constipation, iron deficiency anemia, hiatal hernia, presenting today for follow-up of IDA, constipation.  Last seen in the office 05/29/2023.  Noted no overt GI bleeding.  She was maintaining on oral iron and hemoglobin had remained within normal limits since November 2023.  Iron panel also within normal limits.  Considering this, patient requested to stop iron and monitor.  Discussed that chronic IDA likely secondary to occult GI bleeding from small bowel, but it is reasonable to stop iron and monitor.  GERD remained well-controlled on omeprazole  40 mg daily.  MiraLAX was changed to colace due to reports of diarrhea with MiraLAX constipation without.    Reviewed labs dated 07/14/2023. Iron panel within normal limits. Hemoglobin 14.4.   Today:    Past Medical History:  Diagnosis Date   Breast cancer (HCC)    Breast disorder    left breast at age 64   Cigarette nicotine  dependence, uncomplicated 02/21/2015   Family history of breast cancer    Family history of carcinoma in situ of anal canal 09/01/2017   Family history of colon cancer    H. pylori infection    History of kidney stones    Hypercholesterolemia    Hypertension    Migraine    Ovarian cyst    Personal history of radiation therapy    PONV (postoperative nausea and vomiting)    Reflux    right 05/23/2020   right    Past Surgical History:  Procedure Laterality Date   BREAST BIOPSY Left 04/2019   BREAST BIOPSY Right 05/23/2020   BREAST LUMPECTOMY Left 2020   COLONOSCOPY N/A 12/30/2018   six 4-7 mm polyps in rectum, descending colon, and ascending colon. Sigmoid and descending colon diverticulosis. Serrated and tubular adenoma.  Surveillance 3 years.    COLONOSCOPY WITH PROPOFOL  N/A 02/11/2022   Procedure: COLONOSCOPY WITH PROPOFOL ;  Surgeon: Shaaron Lamar HERO, MD;  [facility];  Service: Endoscopy;  Laterality: N/A;  9:45am, asa 2   ESOPHAGOGASTRODUODENOSCOPY N/A 09/12/2017   normal esophagus, small hiatal hernia, large pedunculated duodenal polyp s/p resection and clip placement. Benign polyp   ESOPHAGOGASTRODUODENOSCOPY (EGD) WITH PROPOFOL  N/A 02/11/2022   Procedure: ESOPHAGOGASTRODUODENOSCOPY (EGD) WITH PROPOFOL ;  Surgeon: Shaaron Lamar HERO, MD;  [facility];  Service: Endoscopy;  Laterality: N/A;   GIVENS CAPSULE STUDY N/A 11/29/2022   Procedure: GIVENS CAPSULE STUDY;  Surgeon: Cindie Carlin POUR, DO;  [facility];  Service: Endoscopy;  Laterality: N/A;  7:30 am   PARTIAL MASTECTOMY WITH NEEDLE LOCALIZATION AND AXILLARY SENTINEL LYMPH NODE BX Left 04/29/2018   Procedure: PARTIAL MASTECTOMY WITH NEEDLE LOCALIZATION AND AXILLARY SENTINEL LYMPH NODE BX;  Surgeon: Kallie Manuelita BROCKS, MD;  Location: [facility];  Service: General;  Laterality: Left;   POLYPECTOMY  12/30/2018   Procedure: POLYPECTOMY;  Surgeon: Shaaron Lamar HERO, MD;  [facility];  Service: Endoscopy;;  ascending,descending,rectal   POLYPECTOMY  02/11/2022   Procedure: POLYPECTOMY;  Surgeon: Shaaron Lamar HERO, MD;  [facility];  Service: Endoscopy;;   TUBAL LIGATION      Current Outpatient Medications  Medication Sig Dispense Refill   ALPRAZolam  (XANAX ) 0.25 MG tablet Take 1 tablet (0.25 mg total) by  mouth at bedtime as needed for anxiety. 30 tablet 2   ALPRAZolam  (XANAX ) 0.25 MG tablet Take 1 tablet (0.25 mg total) by mouth daily as needed for sleep 30 tablet 2   atorvastatin  (LIPITOR) 10 MG tablet Take 1 tablet (10 mg total) by mouth daily. 90 tablet 1   Boric Acid Vaginal 600 MG SUPP Place one suppository in the vagina nightly x 7 days (Patient not taking: Reported on 06/02/2023) 7 suppository 0   CALCIUM  PO  Take 2,400 mg by mouth daily. D3     diazepam  (VALIUM ) 5 MG tablet Take 1 tablet (5 mg total) by mouth at bedtime as needed. Max daily amount is 1 tablet. (Patient not taking: Reported on 06/02/2023) 30 tablet 5   ferrous sulfate 325 (65 FE) MG EC tablet Take 325 mg by mouth daily with breakfast.     fluticasone  (FLONASE ) 50 MCG/ACT nasal spray Place 2 sprays into both nostrils daily. (Patient taking differently: Place 2 sprays into both nostrils daily. As needed) 16 g 5   fluticasone  (FLONASE ) 50 MCG/ACT nasal spray Place 2 sprays into both nostrils daily. (Patient not taking: Reported on 06/02/2023) 16 g 5   hydrochlorothiazide  (HYDRODIURIL ) 25 MG tablet Take 1 tablet (25 mg total) by mouth in the morning. 90 tablet 2   levocetirizine (XYZAL ) 5 MG tablet Take 5 mg by mouth every evening.     meclizine  (ANTIVERT ) 25 MG tablet Take 1 tablet (25 mg total) by mouth 3 (three) times daily as needed for dizziness. 30 tablet 0   meclizine  (ANTIVERT ) 25 MG tablet Take 1 tablet (25 mg total) by mouth 3 (three) times daily as needed. 30 tablet 2   olmesartan  (BENICAR ) 40 MG tablet Take 1 tablet (40 mg total) by mouth daily. 90 tablet 1   omeprazole  (PRILOSEC) 40 MG capsule Take 1 capsule (40 mg total) by mouth 2 (two) times daily before a meal. 60 capsule 3   ondansetron  (ZOFRAN -ODT) 4 MG disintegrating tablet Take 1 tablet (4 mg total) by mouth every 8 (eight) hours as needed for nausea for up to 7 days. 20 tablet 3   polyethylene glycol (MIRALAX / GLYCOLAX) 17 g packet Take 17 g by mouth at bedtime.     potassium chloride  (KLOR-CON  M) 10 MEQ tablet Take 1 tablet (10 mEq total) by mouth daily. 30 tablet 5   Probiotic Product (PROBIOTIC PO) Take 1 tablet by mouth daily.     rizatriptan  (MAXALT ) 10 MG tablet Take 1 tablet (10 mg total) by mouth at onset of migraine, can repeat in 2 hours, do not exceed 2 doses 27 tablet 2   tamoxifen  (NOLVADEX ) 20 MG tablet Take 1 tablet (20 mg total) by mouth daily. 30 tablet  11   No current facility-administered medications for this visit.    Allergies as of 09/03/2023 - Review Complete 06/10/2023  Allergen Reaction Noted   Bee venom Swelling and Other (See Comments) 04/29/2011    Family History  Problem Relation Age of Onset   Arthritis Mother    Cancer Mother        breast   Breast cancer Mother 45       recurrance at 18   COPD Father    Heart disease Father    Hyperlipidemia Father    Hypertension Father    Cancer Sister 40       anal, deceased due to mva but had advanced disease   Stroke Maternal Grandfather    Breast cancer Maternal Aunt  91    Social History   Socioeconomic History   Marital status: Divorced    Spouse name: Not on file   Number of children: 1   Years of education: Not on file   Highest education level: Not on file  Occupational History   Not on file  Tobacco Use   Smoking status: Former    Current packs/day: 0.00    Average packs/day: 0.5 packs/day for 28.0 years (14.0 ttl pk-yrs)    Types: Cigarettes    Start date: 04/22/1990    Quit date: 04/22/2018    Years since quitting: 5.3   Smokeless tobacco: Never  Vaping Use   Vaping status: Never Used  Substance and Sexual Activity   Alcohol use: No    Alcohol/week: 0.0 standard drinks of alcohol   Drug use: No   Sexual activity: Not Currently    Birth control/protection: Surgical    Comment: tubal  Other Topics Concern   Not on file  Social History Narrative   Not on file   Social Drivers of Health   Financial Resource Strain: Low Risk  (07/13/2023)   Received from [facility]   Overall Financial Resource Strain (CARDIA)    Difficulty of Paying Living Expenses: Not very hard  Food Insecurity: No Food Insecurity (07/13/2023)   Received from [facility]   Hunger Vital Sign    Within the past 12 months, you worried that your food would run out before you got the money to buy more.: Never true    Within the past 12 months, the food you bought just didn't  last and you didn't have money to get more.: Never true  Transportation Needs: No Transportation Needs (07/13/2023)   Received from [facility] - Transportation    Lack of Transportation (Medical): No    Lack of Transportation (Non-Medical): No  Physical Activity: Insufficiently Active (07/13/2023)   Received from [facility]   Exercise Vital Sign    On average, how many days per week do you engage in moderate to strenuous exercise (like a brisk walk)?: 2 days    On average, how many minutes do you engage in exercise at this level?: 30 min  Stress: No Stress Concern Present (07/13/2023)   Received from [facility] - Occupational Stress Questionnaire    Feeling of Stress : Not at all  Social Connections: Socially Integrated (07/13/2023)   Received from [facility]   Social Network    How would you rate your social network (family, work, friends)?: Good participation with social networks    Review of Systems: Gen: Denies fever, chills, anorexia. Denies fatigue, weakness, weight loss.  CV: Denies chest pain, palpitations, syncope, peripheral edema, and claudication. Resp: Denies dyspnea at rest, cough, wheezing, coughing up blood, and pleurisy. GI: Denies vomiting blood, jaundice, and fecal incontinence.   Denies dysphagia or odynophagia. Derm: Denies rash, itching, dry skin Psych: Denies depression, anxiety, memory loss, confusion. No homicidal or suicidal ideation.  Heme: Denies bruising, bleeding, and enlarged lymph nodes.  Physical Exam: LMP 09/22/2018 (Approximate) Comment:  on tamoxifen  no period over a year bleedi 11/14 like a period  General:   Alert and oriented. No distress noted. Pleasant and cooperative.  Head:  Normocephalic and atraumatic. Eyes:  Conjuctiva clear without scleral icterus. Heart:  S1, S2 present without murmurs appreciated. Lungs:  Clear to auscultation bilaterally. No wheezes, rales, or rhonchi. No  distress.  Abdomen:  +BS, soft,  non-tender and non-distended. No rebound or guarding. No HSM or masses noted. Msk:  Symmetrical without gross deformities. Normal posture. Extremities:  Without edema. Neurologic:  Alert and  oriented x4 Psych:  Normal mood and affect.    Assessment:     Plan:  ***   Josette Centers, PA-C [facility] 09/03/2023

## 2023-09-03 ENCOUNTER — Ambulatory Visit (INDEPENDENT_AMBULATORY_CARE_PROVIDER_SITE_OTHER): Admitting: Gastroenterology

## 2023-09-03 ENCOUNTER — Encounter: Payer: Self-pay | Admitting: Gastroenterology

## 2023-09-03 VITALS — BP 134/76 | HR 75 | Temp 97.8°F | Ht 67.0 in | Wt 200.0 lb

## 2023-09-03 DIAGNOSIS — Z860101 Personal history of adenomatous and serrated colon polyps: Secondary | ICD-10-CM | POA: Diagnosis not present

## 2023-09-03 DIAGNOSIS — K219 Gastro-esophageal reflux disease without esophagitis: Secondary | ICD-10-CM

## 2023-09-03 DIAGNOSIS — Z8719 Personal history of other diseases of the digestive system: Secondary | ICD-10-CM

## 2023-09-03 DIAGNOSIS — D509 Iron deficiency anemia, unspecified: Secondary | ICD-10-CM

## 2023-09-03 DIAGNOSIS — K5909 Other constipation: Secondary | ICD-10-CM

## 2023-09-03 NOTE — Patient Instructions (Addendum)
 Resume taking MiraLAX 17g daily in 8 oz of water  or other non carbonated beverage of your choice.  If you have recurrent diarrhea issues, you can try decreasing MiraLAX to one half dose daily or go back to combination of MiraLAX and Colace as you are.  If constipation is not improved with MiraLAX daily, we can try a stronger prescription medication.  Continue taking iron 3 times a week.  We will plan to repeat blood work in November to check your hemoglobin and iron.  I will see back in 6 months for routine follow-up.  Do not hesitate to call sooner if you have questions or concerns.  It was great to see you again today!   Josette Centers, PA-C [facility]

## 2023-09-22 ENCOUNTER — Other Ambulatory Visit: Payer: Self-pay

## 2023-09-22 ENCOUNTER — Other Ambulatory Visit (HOSPITAL_COMMUNITY): Payer: Self-pay

## 2023-10-20 ENCOUNTER — Other Ambulatory Visit (HOSPITAL_COMMUNITY): Payer: Self-pay

## 2023-10-22 ENCOUNTER — Other Ambulatory Visit (HOSPITAL_COMMUNITY): Payer: Self-pay

## 2023-10-30 ENCOUNTER — Ambulatory Visit
Admission: EM | Admit: 2023-10-30 | Discharge: 2023-10-30 | Disposition: A | Discharge status: Home / Self Care | Attending: Family Medicine | Admitting: Family Medicine

## 2023-10-30 DIAGNOSIS — R21 Rash and other nonspecific skin eruption: Secondary | ICD-10-CM

## 2023-10-30 MED ORDER — VALACYCLOVIR HCL 1 G PO TABS: 1000.0000 mg | ORAL_TABLET | Freq: Three times a day (TID) | ORAL | 0 refills | Status: AC

## 2023-10-30 MED ORDER — PREDNISONE 20 MG PO TABS: 40.0000 mg | ORAL_TABLET | Freq: Every day | ORAL | 0 refills | Status: DC

## 2023-10-30 NOTE — ED Provider Notes (Signed)
 [facility]    CSN: [id number] Arrival date & time: 10/30/23  1508      History   Chief Complaint Chief Complaint  Patient presents with   Rash   Neck Pain    HPI Lorraine Peters is a 53 y.o. female.   Patient presenting today with 2-day history of a red itchy rash starting on the right side of her neck going down toward the right arm.  She states the area is burning and stinging very similarly to past history of shingles rash.  Denies new soaps or exposures, throat itching or swelling, chest tightness, shortness of breath.  Trying hydrocortisone cream with minimal relief.    Past Medical History:  Diagnosis Date   Breast cancer (HCC)    Breast disorder    left breast at age 89   Cigarette nicotine  dependence, uncomplicated 02/21/2015   Family history of breast cancer    Family history of carcinoma in situ of anal canal 09/01/2017   Family history of colon cancer    H. pylori infection    History of kidney stones    Hypercholesterolemia    Hypertension    Migraine    Ovarian cyst    Personal history of radiation therapy    PONV (postoperative nausea and vomiting)    Reflux    right 05/23/2020   right    Patient Active Problem List   Diagnosis Date Noted   IDA (iron deficiency anemia) 01/09/2022   Colon adenomas 01/09/2022   Rectal bleeding 08/23/2020   Gastroenteritis 05/31/2020   Lipoma of skin and subcutaneous tissue 02/18/2020   History of Helicobacter pylori infection 02/18/2020   Personal history of breast cancer 09/28/2019   Encounter for screening fecal occult blood testing 09/28/2019   Encounter for gynecological examination with Papanicolaou smear of cervix 09/28/2019   Rectal itching 08/20/2019   GERD (gastroesophageal reflux disease) 08/20/2019   Constipation 07/01/2019   Chest wall pain 07/01/2019   Genetic testing 11/03/2018   Family history of breast cancer    Family history of colon cancer    Anterior interosseous nerve syndrome  08/06/2018   Swelling of left hand 05/19/2018   Neuropathic pain of hand, left 05/19/2018   Ductal carcinoma in situ (DCIS) of left breast with comedonecrosis 04/22/2018   Abdominal pain, epigastric 09/01/2017   Nausea without vomiting 09/01/2017   Family history of carcinoma in situ of anal canal 09/01/2017   Pain in right wrist 06/02/2017   Pelvic pain in female 03/22/2015   History of ovarian cyst 03/22/2015   Hyperlipidemia 02/21/2015   Esophageal reflux 02/21/2015   Obesity, unspecified 02/21/2015   Excessive bleeding in premenopausal period 02/21/2015    Past Surgical History:  Procedure Laterality Date   BREAST BIOPSY Left 04/2019   BREAST BIOPSY Right 05/23/2020   BREAST LUMPECTOMY Left 2020   COLONOSCOPY N/A 12/30/2018   six 4-7 mm polyps in rectum, descending colon, and ascending colon. Sigmoid and descending colon diverticulosis. Serrated and tubular adenoma. Surveillance 3 years.    COLONOSCOPY WITH PROPOFOL  N/A 02/11/2022   Procedure: COLONOSCOPY WITH PROPOFOL ;  Surgeon: Shaaron Lamar HERO, MD;  [facility];  Service: Endoscopy;  Laterality: N/A;  9:45am, asa 2   ESOPHAGOGASTRODUODENOSCOPY N/A 09/12/2017   normal esophagus, small hiatal hernia, large pedunculated duodenal polyp s/p resection and clip placement. Benign polyp   ESOPHAGOGASTRODUODENOSCOPY (EGD) WITH PROPOFOL  N/A 02/11/2022   Procedure: ESOPHAGOGASTRODUODENOSCOPY (EGD) WITH PROPOFOL ;  Surgeon: Shaaron Lamar HERO, MD;  [facility];  Service: Endoscopy;  Laterality: N/A;   GIVENS CAPSULE STUDY N/A 11/29/2022   Procedure: GIVENS CAPSULE STUDY;  Surgeon: Cindie Carlin POUR, DO;  [facility];  Service: Endoscopy;  Laterality: N/A;  7:30 am   PARTIAL MASTECTOMY WITH NEEDLE LOCALIZATION AND AXILLARY SENTINEL LYMPH NODE BX Left 04/29/2018   Procedure: PARTIAL MASTECTOMY WITH NEEDLE LOCALIZATION AND AXILLARY SENTINEL LYMPH NODE BX;  Surgeon: Kallie Manuelita BROCKS, MD;  Location: [facility];   Service: General;  Laterality: Left;   POLYPECTOMY  12/30/2018   Procedure: POLYPECTOMY;  Surgeon: Shaaron Lamar HERO, MD;  [facility];  Service: Endoscopy;;  ascending,descending,rectal   POLYPECTOMY  02/11/2022   Procedure: POLYPECTOMY;  Surgeon: Shaaron Lamar HERO, MD;  [facility];  Service: Endoscopy;;   TUBAL LIGATION      OB History     Gravida  1   Para  1   Term      Preterm  1   AB      Living  1      SAB      IAB      Ectopic      Multiple      Live Births  1            Home Medications    Prior to Admission medications   Medication Sig Start Date End Date Taking? Authorizing Provider  ALPRAZolam  (XANAX ) 0.25 MG tablet Take 1 tablet (0.25 mg total) by mouth at bedtime as needed for anxiety. 08/20/21  Yes Rogers Hai, MD  ALPRAZolam  (XANAX ) 0.25 MG tablet Take 1 tablet (0.25 mg total) by mouth daily as needed for sleep 07/14/23  Yes   atorvastatin  (LIPITOR) 10 MG tablet Take 1 tablet (10 mg total) by mouth daily. 06/17/23  Yes   CALCIUM  PO Take 2,400 mg by mouth daily. D3   Yes [provider]  diazepam  (VALIUM ) 5 MG tablet Take 1 tablet (5 mg total) by mouth at bedtime as needed. Max daily amount is 1 tablet. 12/31/21  Yes   ferrous sulfate 325 (65 FE) MG EC tablet Take 325 mg by mouth daily with breakfast.   Yes [provider]  fluticasone  (FLONASE ) 50 MCG/ACT nasal spray Place 2 sprays into both nostrils daily. 05/03/22  Yes   fluticasone  (FLONASE ) 50 MCG/ACT nasal spray Place 2 sprays into both nostrils daily. Patient taking differently: Place 2 sprays into both nostrils as needed. 08/19/22  Yes   hydrochlorothiazide  (HYDRODIURIL ) 25 MG tablet Take 1 tablet (25 mg total) by mouth in the morning. 06/17/23  Yes   levocetirizine (XYZAL ) 5 MG tablet Take 5 mg by mouth every evening.   Yes [provider]  meclizine  (ANTIVERT ) 25 MG tablet Take 1 tablet (25 mg total) by mouth 3 (three) times daily as needed  for dizziness. 12/20/21  Yes Mesner, Selinda, MD  meclizine  (ANTIVERT ) 25 MG tablet Take 1 tablet (25 mg total) by mouth 3 (three) times daily as needed. 07/14/23  Yes   olmesartan  (BENICAR ) 40 MG tablet Take 1 tablet (40 mg total) by mouth daily. 06/17/23  Yes   omeprazole  (PRILOSEC) 40 MG capsule Take 1 capsule (40 mg total) by mouth 2 (two) times daily before a meal. Patient taking differently: Take 40 mg by mouth daily. 09/01/23  Yes Shirlean Therisa ORN, NP  ondansetron  (ZOFRAN -ODT) 4 MG disintegrating tablet Take 1 tablet (4 mg total) by mouth every 8 (eight) hours as needed for nausea for  up to 7 days. 08/19/22  Yes   polyethylene glycol (MIRALAX / GLYCOLAX) 17 g packet Take 17 g by mouth at bedtime.   Yes [provider]  potassium chloride  (KLOR-CON  M) 10 MEQ tablet Take 1 tablet (10 mEq total) by mouth daily. 05/12/23  Yes   predniSONE  (DELTASONE ) 20 MG tablet Take 2 tablets (40 mg total) by mouth daily with breakfast. 10/30/23  Yes Stuart Vernell Norris, PA-C  Probiotic Product (PROBIOTIC PO) Take 1 tablet by mouth daily.   Yes [provider]  rizatriptan  (MAXALT ) 10 MG tablet Take 1 tablet (10 mg total) by mouth at onset of migraine, can repeat in 2 hours, do not exceed 2 doses 07/14/23  Yes   valACYclovir  (VALTREX ) 1000 MG tablet Take 1 tablet (1,000 mg total) by mouth 3 (three) times daily for 7 days. 10/30/23 11/06/23 Yes Stuart Vernell Norris, PA-C  Boric Acid Vaginal 600 MG SUPP Place one suppository in the vagina nightly x 7 days 04/28/23   Ozan, Jennifer, DO  tamoxifen  (NOLVADEX ) 20 MG tablet Take 1 tablet (20 mg total) by mouth daily. 12/16/22   Rogers Hai, MD    Family History Family History  Problem Relation Age of Onset   Arthritis Mother    Cancer Mother        breast   Breast cancer Mother 53       recurrance at 90   COPD Father    Heart disease Father    Hyperlipidemia Father    Hypertension Father    Cancer Sister 74       anal, deceased due to mva but  had advanced disease   Stroke Maternal Grandfather    Breast cancer Maternal Aunt 12    Social History Social History   Tobacco Use   Smoking status: Former    Current packs/day: 0.00    Average packs/day: 0.5 packs/day for 28.0 years (14.0 ttl pk-yrs)    Types: Cigarettes    Start date: 04/22/1990    Quit date: 04/22/2018    Years since quitting: 5.5   Smokeless tobacco: Never  Vaping Use   Vaping status: Never Used  Substance Use Topics   Alcohol use: No    Alcohol/week: 0.0 standard drinks of alcohol   Drug use: No     Allergies   Bee venom   Review of Systems Review of Systems Per HPI  Physical Exam Triage Vital Signs ED Triage Vitals  Encounter Vitals Group     BP 10/30/23 1519 133/84     Girls Systolic BP Percentile --      Girls Diastolic BP Percentile --      Boys Systolic BP Percentile --      Boys Diastolic BP Percentile --      Pulse Rate 10/30/23 1519 75     Resp 10/30/23 1519 20     Temp 10/30/23 1519 97.8 F (36.6 C)     Temp Source 10/30/23 1519 Oral     SpO2 10/30/23 1519 98 %     Weight --      Height --      Head Circumference --      Peak Flow --      Pain Score 10/30/23 1521 9     Pain Loc --      Pain Education --      Exclude from Growth Chart --    No data found.  Updated Vital Signs BP 133/84 (BP Location: Left Arm)  Pulse 75   Temp 97.8 F (36.6 C) (Oral)   Resp 20   LMP 09/22/2018 (Approximate) Comment:  on tamoxifen  no period over a year bleedi 11/14 like a period   SpO2 98%   Visual Acuity Right Eye Distance:   Left Eye Distance:   Bilateral Distance:    Right Eye Near:   Left Eye Near:    Bilateral Near:     Physical Exam Vitals and nursing note reviewed.  Constitutional:      Appearance: Normal appearance. She is not ill-appearing.  HENT:     Head: Atraumatic.     Mouth/Throat:     Mouth: Mucous membranes are moist.  Eyes:     Extraocular Movements: Extraocular movements intact.      Conjunctiva/sclera: Conjunctivae normal.  Cardiovascular:     Rate and Rhythm: Normal rate.  Pulmonary:     Effort: Pulmonary effort is normal.  Musculoskeletal:        General: Normal range of motion.     Cervical back: Normal range of motion and neck supple.  Skin:    General: Skin is warm and dry.     Findings: Rash present.     Comments: Erythematous maculopapular rash forming to right side of neck tender to palpation  Neurological:     Mental Status: She is alert and oriented to person, place, and time.  Psychiatric:        Mood and Affect: Mood normal.        Thought Content: Thought content normal.        Judgment: Judgment normal.      UC Treatments / Results  Labs (all labs ordered are listed, but only abnormal results are displayed) Labs Reviewed - No data to display  EKG   Radiology No results found.  Procedures Procedures (including critical care time)  Medications Ordered in UC Medications - No data to display  Initial Impression / Assessment and Plan / UC Course  I have reviewed the triage vital signs and the nursing notes.  Pertinent labs & imaging results that were available during my care of the patient were reviewed by me and considered in my medical decision making (see chart for details).     Suspect early shingles, particularly given her history of this feeling similar.  Will try Valtrex , prednisone  and supportive home care and continue to monitor.  Return for worsening symptoms.  Final Clinical Impressions(s) / UC Diagnoses   Final diagnoses:  Rash     Discharge Instructions      We are treating you for possible shingles with a course of prednisone  which is for pain and inflammation and Valtrex , the antiviral medication for shingles.  You may also take Tylenol  as needed for pain.  Follow-up with primary care if unresolving.    ED Prescriptions     Medication Sig Dispense Auth. Provider   valACYclovir  (VALTREX ) 1000 MG tablet Take  1 tablet (1,000 mg total) by mouth 3 (three) times daily for 7 days. 21 tablet Stuart Vernell Norris, PA-C   predniSONE  (DELTASONE ) 20 MG tablet Take 2 tablets (40 mg total) by mouth daily with breakfast. 10 tablet Stuart Vernell Norris, [place]      PDMP not reviewed this encounter.   Stuart Vernell Norris, [place] 10/30/23 1549

## 2023-10-30 NOTE — ED Triage Notes (Signed)
 Patient states she started with right neck pain x 1 week. Red itchy rash started on her neck and down her right arm. Patient is using hydrocortisone cream.

## 2023-10-30 NOTE — Discharge Instructions (Signed)
 We are treating you for possible shingles with a course of prednisone  which is for pain and inflammation and Valtrex , the antiviral medication for shingles.  You may also take Tylenol  as needed for pain.  Follow-up with primary care if unresolving.

## 2023-11-11 ENCOUNTER — Other Ambulatory Visit (HOSPITAL_COMMUNITY): Payer: Self-pay

## 2023-11-11 MED ORDER — POTASSIUM CHLORIDE CRYS ER 10 MEQ PO TBCR
10.0000 meq | EXTENDED_RELEASE_TABLET | Freq: Every day | ORAL | 5 refills | Status: AC
  Filled 2023-11-11 – 2023-11-15 (×2): qty 30, 30d supply, fill #0
  Filled 2023-12-17: qty 30, 30d supply, fill #1
  Filled 2024-01-19: qty 30, 30d supply, fill #2
  Filled 2024-02-15: qty 90, 90d supply, fill #3

## 2023-11-12 ENCOUNTER — Other Ambulatory Visit (HOSPITAL_COMMUNITY): Payer: Self-pay

## 2023-11-12 ENCOUNTER — Other Ambulatory Visit: Payer: Self-pay

## 2023-11-15 ENCOUNTER — Other Ambulatory Visit (HOSPITAL_COMMUNITY): Payer: Self-pay

## 2023-11-17 ENCOUNTER — Other Ambulatory Visit (HOSPITAL_COMMUNITY): Payer: Self-pay

## 2023-11-17 ENCOUNTER — Other Ambulatory Visit: Payer: Self-pay

## 2023-12-15 ENCOUNTER — Ambulatory Visit: Admitting: Obstetrics & Gynecology

## 2023-12-15 ENCOUNTER — Encounter: Payer: Self-pay | Admitting: Obstetrics & Gynecology

## 2023-12-15 VITALS — BP 150/81 | HR 91 | Ht 67.0 in | Wt 208.0 lb

## 2023-12-15 DIAGNOSIS — Z1331 Encounter for screening for depression: Secondary | ICD-10-CM

## 2023-12-15 DIAGNOSIS — Z01419 Encounter for gynecological examination (general) (routine) without abnormal findings: Secondary | ICD-10-CM

## 2023-12-15 NOTE — Progress Notes (Signed)
 WELL-WOMAN EXAMINATION Patient name: Lorraine Peters MRN [id number]  Date of birth: 1970/08/21 Chief Complaint:   Annual Exam  History of Present Illness:   Lorraine Peters is a 53 y.o. G1P0101 PM female being seen today for a routine well-woman exam.   Of note at her last visit she was having some urinary discomfort- stopped cayenne pepper and noted improvement of bladder symptoms.  Reports no acute complaints today  Denies vaginal bleeding, discharge, itching or irritation.  Denies pelvic or abdominal pain   Patient's last menstrual period was 09/22/2018 (approximate).  The current method of family planning is post menopausal status.    Last pap 11/2022.  Last mammogram: 05/2023. Last colonoscopy: 02/2022     12/15/2023    3:29 PM 11/21/2022    9:45 AM 11/19/2021    9:38 AM 10/26/2020   11:12 AM 09/28/2019    8:40 AM  Depression screen PHQ 2/9  Decreased Interest 0 0 0 0 0  Down, Depressed, Hopeless 0 0 0 0 0  PHQ - 2 Score 0 0 0 0 0  Altered sleeping 1 1 1  0 0  Tired, decreased energy 1 0 1 0 0  Change in appetite 0 0 1 0 0  Feeling bad or failure about yourself  0 0 0 0 0  Trouble concentrating 0 0 0 0 0  Moving slowly or fidgety/restless 0 0 0 0 0  Suicidal thoughts 0 0 0 0 0  PHQ-9 Score 2 1 3  0 0      Review of Systems:   Pertinent items are noted in HPI Denies any headaches, blurred vision, fatigue, shortness of breath, chest pain, abdominal pain, bowel movements, urination, or intercourse unless otherwise stated above.  Pertinent History Reviewed:  Reviewed past medical,surgical, social and family history.  Reviewed problem list, medications and allergies. Physical Assessment:   Vitals:   12/15/23 1519 12/15/23 1543  BP: (!) 143/77 (!) 150/81  Pulse: 68 91  Weight: 208 lb (94.3 kg)   Height: 5' 7 (1.702 m)   Body mass index is 32.58 kg/m.        Physical Examination:   General appearance - well appearing, and in no distress  Mental status - alert,  oriented to person, place, and time  Psych:  She has a normal mood and affect  Skin - warm and dry, normal color, no suspicious lesions noted  Chest - effort normal, all lung fields clear to auscultation bilaterally  Heart - normal rate and regular rhythm  Neck:  midline trachea, no thyromegaly or nodules  Breasts - breasts appear normal, no suspicious masses, no skin or nipple changes or  axillary nodes  Abdomen - soft, nontender, nondistended, no masses or organomegaly  Pelvic - VULVA: normal appearing vulva with no masses, tenderness or lesions  VAGINA: normal appearing vagina with normal color and discharge, no lesions  CERVIX: normal appearing cervix without discharge or lesions, no CMT   UTERUS: uterus is felt to be normal size, shape, consistency and nontender   ADNEXA: No adnexal masses or tenderness noted.  Extremities:  No swelling or varicosities noted  Chaperone: pt declined     Assessment & Plan:  1) Well-Woman Exam - All screening exams up-to-date  2) Elevated BP  -pt notes BP at home has been normal -thinks it may be related to cough medication  No orders of the defined types were placed in this encounter.   Meds: No orders of the defined types were  placed in this encounter.   Follow-up: Return in about 1 year (around 12/14/2024) for Annual.   Didier Brandenburg, DO Attending Obstetrician & Gynecologist, [facility], [facility]

## 2023-12-17 ENCOUNTER — Other Ambulatory Visit (HOSPITAL_COMMUNITY): Payer: Self-pay

## 2023-12-18 ENCOUNTER — Other Ambulatory Visit: Payer: Self-pay

## 2023-12-18 ENCOUNTER — Other Ambulatory Visit (HOSPITAL_COMMUNITY): Payer: Self-pay

## 2023-12-18 MED ORDER — OLMESARTAN MEDOXOMIL 40 MG PO TABS
40.0000 mg | ORAL_TABLET | Freq: Every day | ORAL | 1 refills | Status: AC
  Filled 2023-12-18: qty 90, 90d supply, fill #0
  Filled 2024-04-04 – 2024-04-08 (×3): qty 90, 90d supply, fill #1

## 2024-01-08 ENCOUNTER — Other Ambulatory Visit (HOSPITAL_COMMUNITY): Payer: Self-pay

## 2024-01-09 ENCOUNTER — Other Ambulatory Visit (HOSPITAL_COMMUNITY): Payer: Self-pay

## 2024-01-09 ENCOUNTER — Other Ambulatory Visit: Payer: Self-pay

## 2024-01-09 MED ORDER — ATORVASTATIN CALCIUM 10 MG PO TABS
10.0000 mg | ORAL_TABLET | Freq: Every day | ORAL | 1 refills | Status: AC
  Filled 2024-01-09 (×2): qty 90, 90d supply, fill #0
  Filled 2024-04-04 – 2024-04-08 (×3): qty 90, 90d supply, fill #1

## 2024-01-19 ENCOUNTER — Other Ambulatory Visit (HOSPITAL_COMMUNITY): Payer: Self-pay

## 2024-01-20 ENCOUNTER — Other Ambulatory Visit (HOSPITAL_COMMUNITY): Payer: Self-pay

## 2024-01-20 MED ORDER — ONDANSETRON 4 MG PO TBDP
4.0000 mg | ORAL_TABLET | Freq: Three times a day (TID) | ORAL | 1 refills | Status: AC | PRN
  Filled 2024-01-20 (×3): qty 20, 7d supply, fill #0

## 2024-01-21 ENCOUNTER — Other Ambulatory Visit (HOSPITAL_COMMUNITY): Payer: Self-pay

## 2024-01-22 ENCOUNTER — Other Ambulatory Visit (HOSPITAL_COMMUNITY): Payer: Self-pay

## 2024-01-27 ENCOUNTER — Other Ambulatory Visit: Payer: Self-pay | Admitting: *Deleted

## 2024-01-27 DIAGNOSIS — D649 Anemia, unspecified: Secondary | ICD-10-CM

## 2024-01-27 DIAGNOSIS — D509 Iron deficiency anemia, unspecified: Secondary | ICD-10-CM

## 2024-01-27 NOTE — Progress Notes (Signed)
 error

## 2024-01-28 ENCOUNTER — Encounter: Payer: Self-pay | Admitting: Gastroenterology

## 2024-02-02 ENCOUNTER — Encounter: Payer: Self-pay | Admitting: *Deleted

## 2024-02-02 DIAGNOSIS — D649 Anemia, unspecified: Secondary | ICD-10-CM | POA: Diagnosis not present

## 2024-02-02 DIAGNOSIS — D509 Iron deficiency anemia, unspecified: Secondary | ICD-10-CM | POA: Diagnosis not present

## 2024-02-02 LAB — CBC WITH DIFFERENTIAL/PLATELET
Absolute Lymphocytes: 2680 {cells}/uL (ref 850–3900)
Absolute Monocytes: 462 {cells}/uL (ref 200–950)
Basophils Absolute: 31 {cells}/uL (ref 0–200)
Basophils Relative: 0.4 %
Eosinophils Absolute: 131 {cells}/uL (ref 15–500)
Eosinophils Relative: 1.7 %
HCT: 42.3 % (ref 35.9–46.0)
Hemoglobin: 13.9 g/dL (ref 11.7–15.5)
MCH: 29 pg (ref 27.0–33.0)
MCHC: 32.9 g/dL (ref 31.6–35.4)
MCV: 88.1 fL (ref 81.4–101.7)
MPV: 12.4 fL (ref 7.5–12.5)
Monocytes Relative: 6 %
Neutro Abs: 4397 {cells}/uL (ref 1500–7800)
Neutrophils Relative %: 57.1 %
Platelets: 251 Thousand/uL (ref 140–400)
RBC: 4.8 Million/uL (ref 3.80–5.10)
RDW: 12.4 % (ref 11.0–15.0)
Total Lymphocyte: 34.8 %
WBC: 7.7 Thousand/uL (ref 3.8–10.8)

## 2024-02-03 ENCOUNTER — Other Ambulatory Visit (HOSPITAL_COMMUNITY): Payer: Self-pay

## 2024-02-03 LAB — IRON,TIBC AND FERRITIN PANEL
%SAT: 31 % (ref 16–45)
Ferritin: 62 ng/mL (ref 16–232)
Iron: 93 ug/dL (ref 45–160)
TIBC: 296 ug/dL (ref 250–450)

## 2024-02-03 MED ORDER — VALACYCLOVIR HCL 1 G PO TABS
1.0000 g | ORAL_TABLET | Freq: Two times a day (BID) | ORAL | 2 refills | Status: DC | PRN
  Filled 2024-02-03: qty 30, 15d supply, fill #0

## 2024-02-04 ENCOUNTER — Other Ambulatory Visit: Payer: Self-pay

## 2024-02-16 ENCOUNTER — Other Ambulatory Visit (HOSPITAL_COMMUNITY): Payer: Self-pay

## 2024-02-19 ENCOUNTER — Ambulatory Visit: Payer: Self-pay | Admitting: Gastroenterology

## 2024-02-26 NOTE — Telephone Encounter (Signed)
 Dena: I ordered iron studies for patient and placed as future.  Can you release these? She will get prior to her February appt.

## 2024-02-27 ENCOUNTER — Other Ambulatory Visit: Payer: Self-pay

## 2024-02-27 DIAGNOSIS — D509 Iron deficiency anemia, unspecified: Secondary | ICD-10-CM

## 2024-03-01 ENCOUNTER — Other Ambulatory Visit (HOSPITAL_COMMUNITY): Payer: Self-pay

## 2024-03-03 ENCOUNTER — Other Ambulatory Visit (HOSPITAL_COMMUNITY): Payer: Self-pay

## 2024-03-12 ENCOUNTER — Other Ambulatory Visit (HOSPITAL_COMMUNITY): Payer: Self-pay

## 2024-03-22 ENCOUNTER — Other Ambulatory Visit: Payer: Self-pay | Admitting: Adult Health Nurse Practitioner

## 2024-03-22 DIAGNOSIS — Z1231 Encounter for screening mammogram for malignant neoplasm of breast: Secondary | ICD-10-CM

## 2024-04-02 ENCOUNTER — Ambulatory Visit: Admitting: Orthopedic Surgery

## 2024-04-02 ENCOUNTER — Encounter: Payer: Self-pay | Admitting: Orthopedic Surgery

## 2024-04-02 ENCOUNTER — Other Ambulatory Visit: Payer: Self-pay

## 2024-04-02 DIAGNOSIS — M79671 Pain in right foot: Secondary | ICD-10-CM | POA: Diagnosis not present

## 2024-04-02 MED ORDER — PREDNISONE 10 MG (21) PO TBPK: ORAL_TABLET | ORAL | 0 refills | Status: AC

## 2024-04-02 NOTE — Progress Notes (Signed)
 New Patient Visit  Summary: Lorraine Peters is a 54 y.o. female with the following: 1. Pain in right foot; R great toe, plantar aspect  Assessment & Plan Right forefoot soft tissue inflammation (metatarsalgia); low concern for tendinous injury or sesamoid fracture Acute, moderate inflammation beneath metatarsal heads, consistent with metatarsalgia and possible tendon or sesamoid irritation, due to unsupportive footwear. No fracture, arthritis, or gout on imaging. Recurrent but not chronic, impairing ambulation and work. Expected resolution with anti-inflammatory therapy and offloading. - Prescribed short course of oral corticosteroids to reduce inflammation. - Provided short walking boot to offload forefoot and accommodate swelling. - Advised supportive footwear, avoid unsupportive shoes. - Discussed adjunctive use of acetaminophen  or ibuprofen  for analgesia. - Reviewed x-ray findings, confirming absence of fracture or significant arthritis.     Follow-up: Return if symptoms worsen or fail to improve.  Subjective:  Chief Complaint  Patient presents with   Foot Pain    Ball of R foot after wearing a pair of bad support boots 03/26/24 and caused a flare and the foot has swollen, can't wear tennis shoes or bare weight on foot.  Pt states this has happened approx 3 yrs ago and the same things happened but it's worse this time.      Discussed the use of AI scribe software for clinical note transcription with the patient, who gave verbal consent to proceed.  History of Present Illness TOWANNA AVERY is a 54 year old female who presents with acute right forefoot pain and swelling.  Pain began Friday after wearing boots with inadequate soles. It worsened by Saturday to the point she could not bear weight over the weekend. She walked normally at work on Monday, developed a limp Tuesday, and by Wednesday had significant swelling and pain that prevented shoe wear.  Pain is localized to the  plantar right forefoot beneath the metatarsal heads, with pain-limited toe motion. Both barefoot and shoe ambulation are painful, and shifting weight to the lateral foot has not helped. She denies trauma or direct injury, and denies pain elsewhere in the foot, erythema, or ecchymosis. She can touch and massage the area without major worsening. She has had similar but milder episodes from poor footwear that resolved in several days.  She has used acetaminophen  and ibuprofen  for pain. She is hesitant about steroids due to prior experiences but would consider a short course if needed.    Review of Systems: No fevers or chills No numbness or tingling No chest pain No shortness of breath No bowel or bladder dysfunction No GI distress No headaches   Medical History:  Past Medical History:  Diagnosis Date   Breast cancer (HCC)    Breast disorder    left breast at age 14   Cigarette nicotine  dependence, uncomplicated 02/21/2015   Family history of breast cancer    Family history of carcinoma in situ of anal canal 09/01/2017   Family history of colon cancer    H. pylori infection    History of kidney stones    Hypercholesterolemia    Hypertension    Migraine    Ovarian cyst    Personal history of radiation therapy    PONV (postoperative nausea and vomiting)    Reflux    right 05/23/2020   right    Past Surgical History:  Procedure Laterality Date   BREAST BIOPSY Left 04/2019   BREAST BIOPSY Right 05/23/2020   BREAST LUMPECTOMY Left 2020   COLONOSCOPY N/A 12/30/2018   six  4-7 mm polyps in rectum, descending colon, and ascending colon. Sigmoid and descending colon diverticulosis. Serrated and tubular adenoma. Surveillance 3 years.    COLONOSCOPY WITH PROPOFOL  N/A 02/11/2022   Procedure: COLONOSCOPY WITH PROPOFOL ;  Surgeon: Shaaron Lamar HERO, MD;  [facility];  Service: Endoscopy;  Laterality: N/A;  9:45am, asa 2   ESOPHAGOGASTRODUODENOSCOPY N/A 09/12/2017   normal  esophagus, small hiatal hernia, large pedunculated duodenal polyp s/p resection and clip placement. Benign polyp   ESOPHAGOGASTRODUODENOSCOPY (EGD) WITH PROPOFOL  N/A 02/11/2022   Procedure: ESOPHAGOGASTRODUODENOSCOPY (EGD) WITH PROPOFOL ;  Surgeon: Shaaron Lamar HERO, MD;  [facility]: [facility];  Service: Endoscopy;  Laterality: N/A;   GIVENS CAPSULE STUDY N/A 11/29/2022   Procedure: GIVENS CAPSULE STUDY;  Surgeon: Cindie Carlin POUR, DO;  [facility]: [facility];  Service: Endoscopy;  Laterality: N/A;  7:30 am   PARTIAL MASTECTOMY WITH NEEDLE LOCALIZATION AND AXILLARY SENTINEL LYMPH NODE BX Left 04/29/2018   Procedure: PARTIAL MASTECTOMY WITH NEEDLE LOCALIZATION AND AXILLARY SENTINEL LYMPH NODE BX;  Surgeon: Kallie Manuelita BROCKS, MD;  Location: [facility];  Service: General;  Laterality: Left;   POLYPECTOMY  12/30/2018   Procedure: POLYPECTOMY;  Surgeon: Shaaron Lamar HERO, MD;  [facility]: [facility];  Service: Endoscopy;;  ascending,descending,rectal   POLYPECTOMY  02/11/2022   Procedure: POLYPECTOMY;  Surgeon: Shaaron Lamar HERO, MD;  [facility]: [facility];  Service: Endoscopy;;   TUBAL LIGATION      Family History  Problem Relation Age of Onset   Arthritis Mother    Cancer Mother        breast   Breast cancer Mother 54       recurrance at 48   COPD Father    Heart disease Father    Hyperlipidemia Father    Hypertension Father    Cancer Sister 65       anal, deceased due to mva but had advanced disease   Stroke Maternal Grandfather    Breast cancer Maternal Aunt 75   Social History[1]  Allergies[2]  Active Medications[3]  Objective: LMP 09/22/2018 Comment:  on tamoxifen  no period over a year bleedi 11/14 like a period   Physical Exam:    General: Alert and oriented. and No acute distress. Gait: Right sided antalgic gait.  Physical Exam MUSCULOSKELETAL: No bruising or redness on the foot. Tenderness in the area of the sesamoid bones, plantar aspect of the right great toe.  She has  pain with extension of the great toe.  No tenderness within the plantar fascia.  Toes warm and well-perfused.  Sensation intact to the dorsum of the foot.  No signs of trauma.   IMAGING: I personally ordered and reviewed the following images  X-rays of the right foot were obtained in clinic today.  These are nonweightbearing views.  No acute injuries are noted.  Well-maintained joint space.  Mild soft tissue swelling in the area of the first MTP.  No chronic changes.  No deformity.  No dislocation.  Impression: Negative right foot x-ray     New Medications:  Meds ordered this encounter  Medications   predniSONE  (STERAPRED UNI-PAK 21 TAB) 10 MG (21) TBPK tablet    Sig: 10 mg DS 12 as directed    Dispense:  48 tablet    Refill:  0      Portions of this note were completed via Scientist, clinical (histocompatibility and immunogenetics).  Oneil DELENA Horde, MD  04/02/2024 10:35 AM      [1]  Social History Tobacco Use  Smoking status: Former    Current packs/day: 0.00    Average packs/day: 0.5 packs/day for 28.0 years (14.0 ttl pk-yrs)    Types: Cigarettes    Start date: 04/22/1990    Quit date: 04/22/2018    Years since quitting: 5.9   Smokeless tobacco: Never  Vaping Use   Vaping status: Never Used  Substance Use Topics   Alcohol use: No    Alcohol/week: 0.0 standard drinks of alcohol   Drug use: No  [2]  Allergies Allergen Reactions   Bee Venom Swelling and Other (See Comments)    Severe swelling  [3]  Current Meds  Medication Sig   ALPRAZolam  (XANAX ) 0.25 MG tablet Take 1 tablet (0.25 mg total) by mouth at bedtime as needed for anxiety.   ALPRAZolam  (XANAX ) 0.25 MG tablet Take 1 tablet (0.25 mg total) by mouth daily as needed for sleep   atorvastatin  (LIPITOR) 10 MG tablet Take 1 tablet (10 mg total) by mouth daily.   Boric Acid Vaginal 600 MG SUPP Place one suppository in the vagina nightly x 7 days   CALCIUM  PO Take 2,400 mg by mouth daily. D3   diazepam  (VALIUM ) 5 MG tablet Take 1 tablet (5  mg total) by mouth at bedtime as needed. Max daily amount is 1 tablet.   ferrous sulfate 325 (65 FE) MG EC tablet Take 325 mg by mouth daily with breakfast.   fluticasone  (FLONASE ) 50 MCG/ACT nasal spray Place 2 sprays into both nostrils daily.   fluticasone  (FLONASE ) 50 MCG/ACT nasal spray Place 2 sprays into both nostrils daily.   hydrochlorothiazide  (HYDRODIURIL ) 25 MG tablet Take 1 tablet (25 mg total) by mouth in the morning.   levocetirizine (XYZAL) 5 MG tablet Take 5 mg by mouth every evening.   meclizine  (ANTIVERT ) 25 MG tablet Take 1 tablet (25 mg total) by mouth 3 (three) times daily as needed for dizziness.   meclizine  (ANTIVERT ) 25 MG tablet Take 1 tablet (25 mg total) by mouth 3 (three) times daily as needed.   olmesartan  (BENICAR ) 40 MG tablet Take 1 tablet (40 mg total) by mouth daily.   omeprazole  (PRILOSEC) 40 MG capsule Take 1 capsule (40 mg total) by mouth 2 (two) times daily before a meal. (Patient taking differently: Take 40 mg by mouth daily.)   ondansetron  (ZOFRAN -ODT) 4 MG disintegrating tablet Take 1 tablet (4 mg total) by mouth every 8 (eight) hours as needed for nausea for up to 7 days.   polyethylene glycol (MIRALAX / GLYCOLAX) 17 g packet Take 17 g by mouth at bedtime.   potassium chloride  (KLOR-CON  M) 10 MEQ tablet Take 1 tablet (10 mEq total) by mouth daily.   predniSONE  (STERAPRED UNI-PAK 21 TAB) 10 MG (21) TBPK tablet 10 mg DS 12 as directed   Probiotic Product (PROBIOTIC PO) Take 1 tablet by mouth daily.   rizatriptan  (MAXALT ) 10 MG tablet Take 1 tablet (10 mg total) by mouth at onset of migraine, can repeat in 2 hours, do not exceed 2 doses   valACYclovir  (VALTREX ) 1000 MG tablet Take one tablet (1,000 mg dose) by mouth 2 (two) times a day as needed.

## 2024-04-04 ENCOUNTER — Other Ambulatory Visit (HOSPITAL_COMMUNITY): Payer: Self-pay

## 2024-04-07 LAB — CBC WITH DIFFERENTIAL/PLATELET
Absolute Lymphocytes: 3049 {cells}/uL (ref 850–3900)
Absolute Monocytes: 580 {cells}/uL (ref 200–950)
Basophils Absolute: 42 {cells}/uL (ref 0–200)
Basophils Relative: 0.5 %
Eosinophils Absolute: 218 {cells}/uL (ref 15–500)
Eosinophils Relative: 2.6 %
HCT: 40.3 % (ref 35.9–46.0)
Hemoglobin: 13.3 g/dL (ref 11.7–15.5)
MCH: 29.4 pg (ref 27.0–33.0)
MCHC: 33 g/dL (ref 31.6–35.4)
MCV: 89.2 fL (ref 81.4–101.7)
MPV: 11.3 fL (ref 7.5–12.5)
Monocytes Relative: 6.9 %
Neutro Abs: 4511 {cells}/uL (ref 1500–7800)
Neutrophils Relative %: 53.7 %
Platelets: 272 10*3/uL (ref 140–400)
RBC: 4.52 Million/uL (ref 3.80–5.10)
RDW: 13.3 % (ref 11.0–15.0)
Total Lymphocyte: 36.3 %
WBC: 8.4 10*3/uL (ref 3.8–10.8)

## 2024-04-07 LAB — IRON,TIBC AND FERRITIN PANEL
%SAT: 20 % (ref 16–45)
Ferritin: 51 ng/mL (ref 16–232)
Iron: 58 ug/dL (ref 45–160)
TIBC: 286 ug/dL (ref 250–450)

## 2024-04-08 ENCOUNTER — Other Ambulatory Visit: Payer: Self-pay

## 2024-04-08 ENCOUNTER — Other Ambulatory Visit (HOSPITAL_COMMUNITY): Payer: Self-pay

## 2024-04-13 ENCOUNTER — Ambulatory Visit: Admitting: Gastroenterology

## 2024-04-13 ENCOUNTER — Telehealth: Payer: Self-pay

## 2024-04-13 ENCOUNTER — Encounter: Payer: Self-pay | Admitting: Gastroenterology

## 2024-04-13 VITALS — BP 135/84 | HR 80 | Temp 98.0°F | Ht 68.0 in | Wt 216.6 lb

## 2024-04-13 DIAGNOSIS — K760 Fatty (change of) liver, not elsewhere classified: Secondary | ICD-10-CM | POA: Insufficient documentation

## 2024-04-13 DIAGNOSIS — D509 Iron deficiency anemia, unspecified: Secondary | ICD-10-CM

## 2024-04-13 DIAGNOSIS — K219 Gastro-esophageal reflux disease without esophagitis: Secondary | ICD-10-CM

## 2024-04-13 NOTE — Telephone Encounter (Signed)
 Pt left IFOBT card (package) up front to pick up per Therisa Stager, NP message sent out in teams

## 2024-04-14 ENCOUNTER — Encounter: Admitting: Gastroenterology

## 2024-04-14 ENCOUNTER — Other Ambulatory Visit: Payer: Self-pay

## 2024-04-14 DIAGNOSIS — K5909 Other constipation: Secondary | ICD-10-CM

## 2024-04-14 DIAGNOSIS — D649 Anemia, unspecified: Secondary | ICD-10-CM

## 2024-04-14 LAB — IFOBT (OCCULT BLOOD): IFOBT: NEGATIVE

## 2024-04-14 NOTE — Progress Notes (Signed)
IFOBT  

## 2024-05-24 ENCOUNTER — Ambulatory Visit

## 2024-06-09 ENCOUNTER — Other Ambulatory Visit

## 2024-06-16 ENCOUNTER — Ambulatory Visit: Admitting: Physician Assistant
